# Patient Record
Sex: Female | Born: 1960 | Race: White | Hispanic: No | Marital: Married | Smoking: Never smoker
Health system: Southern US, Community
[De-identification: ages and names within clinical notes are randomized; demographics above are authoritative.]

## PROBLEM LIST (undated history)

## (undated) DIAGNOSIS — E119 Type 2 diabetes mellitus without complications: Secondary | ICD-10-CM

## (undated) DIAGNOSIS — M1990 Unspecified osteoarthritis, unspecified site: Secondary | ICD-10-CM

## (undated) DIAGNOSIS — R011 Cardiac murmur, unspecified: Secondary | ICD-10-CM

## (undated) DIAGNOSIS — IMO0001 Reserved for inherently not codable concepts without codable children: Secondary | ICD-10-CM

## (undated) DIAGNOSIS — I5032 Chronic diastolic (congestive) heart failure: Secondary | ICD-10-CM

## (undated) DIAGNOSIS — J189 Pneumonia, unspecified organism: Secondary | ICD-10-CM

## (undated) DIAGNOSIS — K219 Gastro-esophageal reflux disease without esophagitis: Secondary | ICD-10-CM

## (undated) DIAGNOSIS — R7611 Nonspecific reaction to tuberculin skin test without active tuberculosis: Secondary | ICD-10-CM

## (undated) DIAGNOSIS — J40 Bronchitis, not specified as acute or chronic: Secondary | ICD-10-CM

## (undated) DIAGNOSIS — I10 Essential (primary) hypertension: Secondary | ICD-10-CM

## (undated) DIAGNOSIS — Z8489 Family history of other specified conditions: Secondary | ICD-10-CM

## (undated) HISTORY — PX: CARPAL TUNNEL RELEASE: SHX101

## (undated) HISTORY — PX: ULNAR NERVE REPAIR: SHX2594

## (undated) HISTORY — PX: TRIGGER FINGER RELEASE: SHX641

## (undated) HISTORY — PX: COLONOSCOPY: SHX174

## (undated) HISTORY — DX: Chronic diastolic (congestive) heart failure: I50.32

## (undated) HISTORY — PX: EYE SURGERY: SHX253

## (undated) HISTORY — PX: CERVICAL DISCECTOMY: SHX98

---

## 1999-02-21 ENCOUNTER — Other Ambulatory Visit: Admission: RE | Admit: 1999-02-21 | Discharge: 1999-02-21 | Payer: Self-pay | Admitting: Obstetrics and Gynecology

## 2000-02-25 ENCOUNTER — Other Ambulatory Visit: Admission: RE | Admit: 2000-02-25 | Discharge: 2000-02-25 | Payer: Self-pay | Admitting: Obstetrics and Gynecology

## 2000-06-16 ENCOUNTER — Encounter: Payer: Self-pay | Admitting: Obstetrics and Gynecology

## 2000-06-16 ENCOUNTER — Encounter: Admission: RE | Admit: 2000-06-16 | Discharge: 2000-06-16 | Payer: Self-pay | Admitting: Obstetrics and Gynecology

## 2001-05-24 ENCOUNTER — Encounter: Admission: RE | Admit: 2001-05-24 | Discharge: 2001-05-24 | Payer: Self-pay | Admitting: Internal Medicine

## 2001-05-24 ENCOUNTER — Encounter: Payer: Self-pay | Admitting: Internal Medicine

## 2001-07-20 ENCOUNTER — Other Ambulatory Visit: Admission: RE | Admit: 2001-07-20 | Discharge: 2001-07-20 | Payer: Self-pay | Admitting: Obstetrics and Gynecology

## 2001-07-21 ENCOUNTER — Encounter: Admission: RE | Admit: 2001-07-21 | Discharge: 2001-07-21 | Payer: Self-pay | Admitting: Obstetrics and Gynecology

## 2001-07-21 ENCOUNTER — Encounter: Payer: Self-pay | Admitting: Obstetrics and Gynecology

## 2001-11-20 ENCOUNTER — Encounter: Payer: Self-pay | Admitting: Emergency Medicine

## 2001-11-20 ENCOUNTER — Emergency Department (HOSPITAL_COMMUNITY): Admission: EM | Admit: 2001-11-20 | Discharge: 2001-11-20 | Payer: Self-pay | Admitting: Emergency Medicine

## 2002-09-05 ENCOUNTER — Other Ambulatory Visit: Admission: RE | Admit: 2002-09-05 | Discharge: 2002-09-05 | Payer: Self-pay | Admitting: Obstetrics and Gynecology

## 2002-12-16 ENCOUNTER — Encounter: Admission: RE | Admit: 2002-12-16 | Discharge: 2002-12-16 | Payer: Self-pay | Admitting: Obstetrics and Gynecology

## 2002-12-16 ENCOUNTER — Encounter: Payer: Self-pay | Admitting: Obstetrics and Gynecology

## 2004-04-11 ENCOUNTER — Ambulatory Visit (HOSPITAL_BASED_OUTPATIENT_CLINIC_OR_DEPARTMENT_OTHER): Admission: RE | Admit: 2004-04-11 | Discharge: 2004-04-11 | Payer: Self-pay | Admitting: Orthopedic Surgery

## 2004-04-11 ENCOUNTER — Ambulatory Visit (HOSPITAL_COMMUNITY): Admission: RE | Admit: 2004-04-11 | Discharge: 2004-04-11 | Payer: Self-pay | Admitting: Orthopedic Surgery

## 2005-04-10 ENCOUNTER — Emergency Department (HOSPITAL_COMMUNITY): Admission: EM | Admit: 2005-04-10 | Discharge: 2005-04-10 | Payer: Self-pay | Admitting: Emergency Medicine

## 2006-02-11 ENCOUNTER — Encounter: Admission: RE | Admit: 2006-02-11 | Discharge: 2006-02-11 | Payer: Self-pay | Admitting: Obstetrics and Gynecology

## 2006-03-04 ENCOUNTER — Encounter: Admission: RE | Admit: 2006-03-04 | Discharge: 2006-03-04 | Payer: Self-pay | Admitting: Obstetrics and Gynecology

## 2006-08-21 ENCOUNTER — Encounter: Admission: RE | Admit: 2006-08-21 | Discharge: 2006-08-21 | Payer: Self-pay | Admitting: Obstetrics and Gynecology

## 2006-11-03 ENCOUNTER — Ambulatory Visit (HOSPITAL_BASED_OUTPATIENT_CLINIC_OR_DEPARTMENT_OTHER): Admission: RE | Admit: 2006-11-03 | Discharge: 2006-11-03 | Payer: Self-pay | Admitting: Orthopedic Surgery

## 2007-02-12 ENCOUNTER — Encounter: Admission: RE | Admit: 2007-02-12 | Discharge: 2007-02-12 | Payer: Self-pay | Admitting: Obstetrics and Gynecology

## 2007-09-11 ENCOUNTER — Emergency Department (HOSPITAL_COMMUNITY): Admission: EM | Admit: 2007-09-11 | Discharge: 2007-09-12 | Payer: Self-pay | Admitting: Emergency Medicine

## 2008-02-16 ENCOUNTER — Encounter: Admission: RE | Admit: 2008-02-16 | Discharge: 2008-02-16 | Payer: Self-pay | Admitting: Obstetrics and Gynecology

## 2009-02-16 ENCOUNTER — Encounter: Admission: RE | Admit: 2009-02-16 | Discharge: 2009-02-16 | Payer: Self-pay | Admitting: Obstetrics and Gynecology

## 2009-12-27 ENCOUNTER — Ambulatory Visit (HOSPITAL_BASED_OUTPATIENT_CLINIC_OR_DEPARTMENT_OTHER): Admission: RE | Admit: 2009-12-27 | Discharge: 2009-12-27 | Payer: Self-pay | Admitting: Orthopedic Surgery

## 2010-03-15 ENCOUNTER — Encounter: Admission: RE | Admit: 2010-03-15 | Discharge: 2010-03-15 | Payer: Self-pay | Admitting: Obstetrics and Gynecology

## 2010-06-04 ENCOUNTER — Ambulatory Visit (HOSPITAL_BASED_OUTPATIENT_CLINIC_OR_DEPARTMENT_OTHER): Admission: RE | Admit: 2010-06-04 | Discharge: 2010-06-04 | Payer: Self-pay | Admitting: Orthopedic Surgery

## 2010-06-07 ENCOUNTER — Encounter (INDEPENDENT_AMBULATORY_CARE_PROVIDER_SITE_OTHER): Payer: Self-pay | Admitting: *Deleted

## 2010-06-18 ENCOUNTER — Encounter (INDEPENDENT_AMBULATORY_CARE_PROVIDER_SITE_OTHER): Payer: Self-pay | Admitting: *Deleted

## 2010-06-19 ENCOUNTER — Encounter (INDEPENDENT_AMBULATORY_CARE_PROVIDER_SITE_OTHER): Payer: Self-pay | Admitting: *Deleted

## 2010-06-19 ENCOUNTER — Telehealth: Payer: Self-pay | Admitting: Gastroenterology

## 2010-06-19 ENCOUNTER — Ambulatory Visit: Payer: Self-pay | Admitting: Gastroenterology

## 2010-07-10 ENCOUNTER — Ambulatory Visit: Payer: Self-pay | Admitting: Gastroenterology

## 2010-07-12 ENCOUNTER — Encounter: Payer: Self-pay | Admitting: Gastroenterology

## 2011-01-05 ENCOUNTER — Encounter: Payer: Self-pay | Admitting: Obstetrics and Gynecology

## 2011-01-16 NOTE — Letter (Signed)
Summary: Diabetic Instructions  [facility]  [address] [place], [place] [address]   Phone: [phone]  Fax: [phone]    Wanda Hicks Feb 17, 1961 MRN: [id number]   _  _   ORAL DIABETIC MEDICATION INSTRUCTIONS  The day before your procedure:   Take your diabetic pill as you do normally  The day of your procedure:   Do not take your diabetic pill    We will check your blood sugar levels during the admission process and again in Recovery before discharging you home  ________________________________________________________________________  _  _   INSULIN (LONG ACTING) MEDICATION INSTRUCTIONS (Lantus, NPH, 70/30, Humulin, Novolin-N)   The day before your procedure:   Take  your regular evening dose    The day of your procedure:   Do not take your morning dose    _  _   INSULIN (SHORT ACTING) MEDICATION INSTRUCTIONS (Regular, Humulog, Novolog)   The day before your procedure:   Do not take your evening dose   The day of your procedure:   Do not take your morning dose   _  _   INSULIN PUMP MEDICATION INSTRUCTIONS  We will contact the physician managing your diabetic care for written dosage instructions for the day before your procedure and the day of your procedure.  Once we have received the instructions, we will contact you.

## 2011-01-16 NOTE — Letter (Signed)
Summary: Patient Notice- Colon Biospy Results  [facility]  [address] [place], [place] [address]   Phone: [phone]  Fax: [phone]        July 12, 2010 MRN: [id number]    [facility] [address] [place], [place]  [address]    Dear Ms. Sardina,  I am pleased to inform you that the biopsies taken during your recent colonoscopy did not show any evidence of cancer upon pathologic examination.  Additional information/recommendations:  __No further action is needed at this time.  Please follow-up with      your primary care physician for your other healthcare needs.  __Please call [phone] to schedule a return visit to review      your condition.  _x_Continue with the treatment plan as outlined on the day of your      exam.  __You should have a repeat colonoscopy examination for this problem           in 10_ years.  Please call us if you are having persistent problems or have questions about your condition that have not been fully answered at this time.  Sincerely,  Mardella Layman MD [facility]   This letter has been electronically signed by your physician.  Appended Document: Patient Notice- Colon Biospy Results letter mailed 8.1.2011

## 2011-01-16 NOTE — Miscellaneous (Signed)
Summary: [facility]  Clinical Lists Changes  Medications: Added new medication of MOVIPREP 100 GM  SOLR (PEG-KCL-NACL-NASULF-NA ASC-C) As per prep instructions. - Signed Rx of MOVIPREP 100 GM  SOLR (PEG-KCL-NACL-NASULF-NA ASC-C) As per prep instructions.;  #1 x 0;  Signed;  Entered by: Ezra Sites RN;  Authorized by: Mardella Layman MD [facility];  Method used: Electronically to [facility] [address], [place], [place], [place]  [address], Ph: [phone], Fax: [phone] Observations: Added new observation of NKA: T (06/19/2010 10:08)    Prescriptions: MOVIPREP 100 GM  SOLR (PEG-KCL-NACL-NASULF-NA ASC-C) As per prep instructions.  #1 x 0   Entered by:   Ezra Sites RN   Authorized by:   Mardella Layman MD [facility]   Signed by:   Ezra Sites RN on 06/19/2010   Method used:   Electronically to        [facility] (retail[facility]       [address]       [place], [place]  [address]       Ph: [phone]       Fax: [phone]   RxID:   [id number]

## 2011-01-16 NOTE — Letter (Signed)
Summary: [facility] Instructions  [facility]  [address] [place], [place] [address]   Phone: [phone]  Fax: [phone]       Wanda Hicks    [birth year]    MRN: [id number]        Procedure Day Dorna Bloom:  [name]  07/10/10     Arrival Time: 9:00am     Procedure Time: 10:00am     Location of Procedure:                    Wanda Hicks  [facility] (4th Floor)                        PREPARATION FOR COLONOSCOPY WITH MOVIPREP   Starting 5 days prior to your procedure  FRIDAY 07/22  do not eat nuts, seeds, popcorn, corn, beans, peas,  salads, or any raw vegetables.  Do not take any fiber supplements (e.g. Metamucil, Citrucel, and Benefiber).  THE DAY BEFORE YOUR PROCEDURE         DATE: TUESDAY  07/26  1.  Drink clear liquids the entire day-NO SOLID FOOD  2.  Do not drink anything colored red or purple.  Avoid juices with pulp.  No orange juice.  3.  Drink at least 64 oz. (8 glasses) of fluid/clear liquids during the day to prevent dehydration and help the prep work efficiently.  CLEAR LIQUIDS INCLUDE: Water Jello Ice Popsicles Tea (sugar ok, no milk/cream) Powdered fruit flavored drinks Coffee (sugar ok, no milk/cream) Gatorade Juice: apple, white grape, white cranberry  Lemonade Clear bullion, consomm, broth Carbonated beverages (any kind) Strained chicken noodle soup Hard Candy                             4.  In the morning, mix first dose of MoviPrep solution:    Empty 1 Pouch A and 1 Pouch B into the disposable container    Add lukewarm drinking water to the top line of the container. Mix to dissolve    Refrigerate (mixed solution should be used within 24 hrs)  5.  Begin drinking the prep at 5:00 p.m. The MoviPrep container is divided by 4 marks.   Every 15 minutes drink the solution down to the next mark (approximately 8 oz) until the full liter is complete.   6.  Follow completed prep with 16 oz of clear liquid of your choice  (Nothing red or purple).  Continue to drink clear liquids until bedtime.  7.  Before going to bed, mix second dose of MoviPrep solution:    Empty 1 Pouch A and 1 Pouch B into the disposable container    Add lukewarm drinking water to the top line of the container. Mix to dissolve    Refrigerate  THE DAY OF YOUR PROCEDURE      DATE: [facility]  07/27  Beginning at  5:00 a.m. (5 hours before procedure):         1. Every 15 minutes, drink the solution down to the next mark (approx 8 oz) until the full liter is complete.  2. Follow completed prep with 16 oz. of clear liquid of your choice.    3. You may drink clear liquids until 8:00am  (2 HOURS BEFORE PROCEDURE).   MEDICATION INSTRUCTIONS  Unless otherwise instructed, you should take regular prescription medications with a small sip of water   as early as possible  the morning of your procedure.  Diabetic patients - see separate instructions.  Additional medication instructions:  Hold Lisinopril/HCTZ day of procedure.         OTHER INSTRUCTIONS  You will need a responsible adult at least 52 years of age to accompany you and drive you home.   This person must remain in the waiting room during your procedure.  Wear loose fitting clothing that is easily removed.  Leave jewelry and other valuables at home.  However, you may wish to bring a book to read or  an iPod/MP3 player to listen to music as you wait for your procedure to start.  Remove all body piercing jewelry and leave at home.  Total time from sign-in until discharge is approximately 2-3 hours.  You should go home directly after your procedure and rest.  You can resume normal activities the  day after your procedure.  The day of your procedure you should not:   Drive   Make legal decisions   Operate machinery   Drink alcohol   Return to work  You will receive specific instructions about eating, activities and medications before you leave.    The  above instructions have been reviewed and explained to me by   Wanda Sites RN  June 19, 2010 10:30 AM    I fully understand and can verbalize these instructions _____________________________ Date _________

## 2011-01-16 NOTE — Letter (Signed)
Summary: Previsit letter  [facility]  [address] [place], [place] [address]   Phone: [phone]  Fax: [phone]       06/07/2010 MRN: [id number]  [facility] [address] [place], [place]  [address]  Dear Ms. Wanda Hicks,  Welcome to the Gastroenterology Division at [facility].    You are scheduled to see a nurse for your pre-procedure visit on 06/26/2010 at 1:00pm on the 3rd floor at [facility] [address]  We ask that you try to arrive at our office 15 minutes prior to your appointment time to allow for check-in.  Your nurse visit will consist of discussing your medical and surgical history, your immediate family medical history, and your medications.    Please bring a complete list of all your medications or, if you prefer, bring the medication bottles and we will list them.  We will need to be aware of both prescribed and over the counter drugs.  We will need to know exact dosage information as well.  If you are on blood thinners (Coumadin, Plavix, Aggrenox, Ticlid, etc.) please call our office today/prior to your appointment, as we need to consult with your physician about holding your medication.   Please be prepared to read and sign documents such as consent forms, a financial agreement, and acknowledgement forms.  If necessary, and with your consent, a friend or relative is welcome to sit-in on the nurse visit with you.  Please bring your insurance card so that we may make a copy of it.  If your insurance requires a referral to see a specialist, please bring your referral form from your primary care physician.  No co-pay is required for this nurse visit.     If you cannot keep your appointment, please call [phone] to cancel or reschedule prior to your appointment date.  This allows [place] the opportunity to schedule an appointment for another patient in need of care.    Thank you for choosing [facility] for your medical needs.   We appreciate the opportunity to care for you.  Please visit [place] at our website  to learn more about our practice.                     Sincerely.                                                                                                                   The Gastroenterology Division

## 2011-01-16 NOTE — Procedures (Signed)
Summary: Colonoscopy  Patient: Wanda Hicks Note: All result statuses are Final unless otherwise noted.  Tests: (1) Colonoscopy (COL)   COL Colonoscopy           DONE     [facility]     [address]     [place], [place]  [address]           COLONOSCOPY PROCEDURE REPORT           PATIENT:  Wanda Hicks, Wanda Hicks  MR#:  [id number]     BIRTHDATE:  06/18/1961, 49 yrs. old  GENDER:  female     ENDOSCOPIST:  Vania Rea. Jarold Motto, MD, [facility]     REF. BY:  Geoffry Paradise, M.D.     PROCEDURE DATE:  07/10/2010     PROCEDURE:  Colonoscopy with biopsy     ASA CLASS:  Class II     INDICATIONS:  Routine Risk Screening     MEDICATIONS:   Fentanyl 75 mcg IV, Versed 9 mg IV           DESCRIPTION OF PROCEDURE:   After the risks benefits and     alternatives of the procedure were thoroughly explained, informed     consent was obtained.  Digital rectal exam was performed and     revealed no abnormalities.   The LB CF-H180AL [id number] endoscope     was introduced through the anus and advanced to the terminal ileum     which was intubated for a short distance, without limitations.     The quality of the prep was excellent, using MoviPrep.  The     instrument was then slowly withdrawn as the colon was fully     examined.     <<PROCEDUREIMAGES>>     FINDINGS:  Abnormal appearing mucosa. Inflammed IC valve     biopsied.the ileum appears normal.  No polyps or cancers were     seen.  This was otherwise a normal examination of the colon.     Retroflexed views in the rectum revealed no abnormalities.    The     scope     was then withdrawn from the patient and the procedure completed.           COMPLICATIONS:  None     ENDOSCOPIC IMPRESSION:     1) Abnormal mucosa     2) No polyps or cancers     3) Otherwise normal examination     probable gut NSAID damage.No evidence or hx. of IBD.     RECOMMENDATIONS:     1) Await biopsy results     2) Continue current colorectal screening recommendations for  "routine risk" patients with a repeat colonoscopy in 10 years.     REPEAT EXAM:  No           ______________________________     Vania Rea. Jarold Motto, MD, [facility]           CC:           n.     eSIGNED:   Vania Rea. Natane Heward at 07/10/2010 10:21 AM           Gretel Acre, [id number]  Note: An exclamation mark (!) indicates a result that was not dispersed into the flowsheet. Document Creation Date: 07/10/2010 10:22 AM _______________________________________________________________________  (1) Order result status: Final Collection or observation date-time: 07/10/2010 10:14 Requested date-time:  Receipt date-time:  Reported date-time:  Referring Physician:   Ordering Physician: Sheryn Bison [id number]) Specimen Source:  Source: Launa Grill Order Number: [id number] Lab site:   Appended Document: Colonoscopy     Procedures Next Due Date:    Colonoscopy: 06/2020

## 2011-01-16 NOTE — Letter (Signed)
Summary: Insulin pump letter-Colonoscopy   [facility]  [address] [place], [place] [address]   Phone: [phone]  Fax: [phone]      Date: June 19, 2010  Re: LEMOYNE SCARPATI DOB: 05/31/1961 MRN: [id number]     Dear Dr. :  Jacky Kindle     Dr. Jarold Motto  has scheduled the above patient for a colonoscopy at 10:00  on July 27.  Our records show that he/she is on insulin therapy via an insulin pump.  Our colonoscopy prep protocol requires that:   the patient must be on a clear liquid diet the entire day prior to the procedure date as well as the morning of the procedure   the patient must be NPO for 2 hours prior to the procedure    the patient must consume a PEG 3350 solution to prepare for the procedure.  Please advise [place] of any adjustments that need to be made to the patient's insulin pump therapy prior to the above procedure date.    Please route or fax back this completed form to me at [phone]  If you have any question, please call me at [phone].  Thank you for your help with this matter.  Sincerely,   Ashok Cordia RN     Physician Recommendation:  ________________________________________________  ________________________________________________________________________  ________________________________________________________________________  ________________________________________________________________________  Appended Document: Insulin pump letter-Colonoscopy Per Selena Batten at Dr. Lanell Matar office they will contact pt with instructions re insulin pump.  Pt is to decrease midnight basals down by 3 units until after colon is done and eating regular diet.  No Bolus and check blood sugars frequently.

## 2011-01-16 NOTE — Progress Notes (Signed)
Summary: Insulin Pump and Colon  ---- Converted from flag ---- ---- 06/19/2010 11:56 AM, Ezra Sites RN wrote: Wanda Hicks, Wanda Hicks is scheduled for colonoscopy 7/27 at 10:0 with Dr. Jarold Motto.  She is diabetic with an insulin pump.  Dr. Geoffry Paradise manages her diabetes.  Could you send a letter to him requesting insulin pump management the day before the procedure and the day of.  I told her that you would call her after you hear from Dr. Jacky Kindle.  Pt's home phone number is [phone].  Cell is [phone]   Thanks ------------------------------  Phone Note Outgoing Call    Follow-up for Phone Call        Letter faxed to Dr. Jacky Kindle.  Will await reply. Follow-up by: Ashok Cordia RN,  June 19, 2010 1:04 PM

## 2011-01-16 NOTE — Progress Notes (Signed)
Summary: Cost of Prep  Phone Note Call from Patient Call back at [phone]   Caller: Patient Call For: Jarold Motto Summary of Call: Pts copay for moviprep is $55 her insurance does cover it that is just her copay. She would like something less expensive and new instructions. Initial call taken by: Harlow Mares CMA Duncan Dull),  June 19, 2010 12:16 PM  Follow-up for Phone Call        NO ALTERNATIVE WITH DIABETES...NO OTHER APPROVED PREP ECEPT NU-TELY... Follow-up by: Mardella Layman MD Eustace Quail 3:43 PM  Additional Follow-up for Phone Call Additional follow up Details #1::        Talked with pt.  Rebate coupon given to pt.   Additional Follow-up by: Ashok Cordia RN,  June 20, 2010 12:11 PM

## 2011-01-16 NOTE — Letter (Signed)
Summary: Diabetic Instructions  [facility]  [address] [place], [place] [address]   Phone: [phone]  Fax: [phone]    Wanda Hicks [birth year] MRN: [id number]          INSULIN PUMP MEDICATION INSTRUCTIONS  We will contact the physician managing your diabetic care for written dosage instructions for the day before your procedure and the day of your procedure.  Once we have received the instructions, we will contact you.

## 2011-03-01 LAB — GLUCOSE, CAPILLARY: Glucose-Capillary: 53 mg/dL — ABNORMAL LOW (ref 70–99)

## 2011-04-21 ENCOUNTER — Other Ambulatory Visit: Payer: Self-pay | Admitting: Obstetrics and Gynecology

## 2011-04-21 DIAGNOSIS — Z1231 Encounter for screening mammogram for malignant neoplasm of breast: Secondary | ICD-10-CM

## 2011-04-29 ENCOUNTER — Ambulatory Visit
Admission: RE | Admit: 2011-04-29 | Discharge: 2011-04-29 | Disposition: A | Discharge status: Home / Self Care | Payer: BC Managed Care – PPO | Source: Ambulatory Visit | Attending: Obstetrics and Gynecology | Admitting: Obstetrics and Gynecology

## 2011-04-29 DIAGNOSIS — Z1231 Encounter for screening mammogram for malignant neoplasm of breast: Secondary | ICD-10-CM

## 2011-05-02 NOTE — Op Note (Signed)
NAME:  CHARMION, HAPKE                ACCOUNT NO.:  [id number]   MEDICAL RECORD NO.:  [id number]          PATIENT TYPE:  AMB   LOCATION:  DSC                          FACILITY:  [facility]   PHYSICIAN:  Katy Fitch. Sypher, M.D. 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   OPERATION:  Release of right ring finger A1 pulley.   OPERATION SURGEON:  Josephine Igo, M.D.   ASSISTANT:  Annye Rusk, P.A.-C.   ANESTHESIA:  2% lidocaine supplemented by intravenous sedation.   SUPERVISING ANESTHESIOLOGIST:  Dr. Sampson Goon.   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 Othelia Pulling for evaluation and management of chronic  stenosing tenosynovitis.  She has had prior A1 pulley releases.   She has had triggering of her right ring finger and requests release of the  A1 pulley at this time.   PROCEDURE:  Glenys Snader is brought to the operating room and placed in the  supine position on the operating table.   Following light sedation, the right arm was prepped with Betadine soap  solution and sterilely draped.  A pneumatic tourniquet was applied at the  proximal right brachium.   Following exsanguination of right arm with an Esmarch bandage, the arterial  tourniquet was inflated to 240 mmHg.   The procedure commenced with a short incision in the line of the distal  palmar crease.  The subcutaneous tissues were carefully divided around the  palmar fascia.  This was excised.  The A1 pulley was isolated.  Blunt  retractors were placed to protect the common digital vessels and nerves.   The pulley was then split with scalpel scissors along its radial border.  Thereafter, full active  range of motion of the finger was demonstrated by  Ms. Brooke Dare.   The wound was then repaired with a mattress suture of 5-0 nylon.  A  compressive dressing was applied with Xeroform sterile gauze and an Ace  wrap.   Ms. Soulliere is advised to begin immediate range of motion exercises.  She is  provided Darvocet-N 100, one by mouth ever 4-6 hours p.r.n. pain, 20 tablets  without refill.   She will return to the office for follow-up in one week or sooner if  problems.      Katy Fitch Sypher, M.D.  Electronically Signed     RVS/MEDQ  D:  11/03/2006  T:  11/03/2006  Job:  [id number]   cc:   Geoffry Paradise, M.D.

## 2011-05-02 NOTE — Op Note (Signed)
NAME:  Wanda Hicks, Wanda Hicks                          ACCOUNT NO.:  [id number]   MEDICAL RECORD NO.:  [id number]                   PATIENT TYPE:  AMB   LOCATION:  DSC                                  FACILITY:  [facility]   PHYSICIAN:  Katy Fitch. Naaman Plummer., M.D.          DATE OF BIRTH:  09-Aug-1961   DATE OF PROCEDURE:  04/11/2004  DATE OF DISCHARGE:                                 OPERATIVE REPORT   PREOPERATIVE DIAGNOSIS:  Chronic stenosing tenosynovitis, left long finger  at A-1 pulley.   POSTOPERATIVE DIAGNOSIS:  Chronic stenosing tenosynovitis, left long finger  at A-1 pulley.   OPERATION PERFORMED:  Release of left long finger A-1 pulley.   SURGEON:  Katy Fitch. Sypher, M.D.   ASSISTANT:  Jonni Sanger, P.A.   ANESTHESIA:  0.25% Marcaine and 2% lidocaine metacarpal head level block,  left long finger supplemented by intravenous sedation.   SUPERVISING ANESTHESIOLOGIST:  Janetta Hora. Gelene Mink, M.D.   INDICATIONS FOR PROCEDURE:  [name] is a 50 year old woman with a  history of insulin dependent diabetes.  She has had a history of carpal  tunnel syndrome and stenosing tenosynovitis.  She has had a chronic left  long finger stenosing tenosynovitis unresponsive to nonoperative management.  Due to failure to respond, the patient is brought to the operating room at  this time for release of her left long finger A-1 pulley.   DESCRIPTION OF PROCEDURE:  [name] was brought to the operating room  and placed in supine position on the operating table.  Following routine  Betadine scrub and paint, 0.25% Marcaine and 2% lidocaine were infiltrated  into the flexor sheath of the left long finger.  When anesthesia was  satisfactory, the left arm was exsanguinated with an Esmarch bandage and  arterial tourniquet inflated to 220 mmHg.  The procedure commenced with a  short incision in the distal palmar crease overlying the A-1 pulley of the  left long finger.   The subcutaneous tissues  were carefully divided revealing the palmar fascia.  This was split with scissors to reveal the A-1 pulley.  The neurovascular  bundles were gently retracted followed by release of an A0 and A1 pulley.  Thereafter full range of motion of the finger was recovered.  The wound was  then irrigated and repaired with  interrupted sutures of 5-0 nylon.  A compressive dressing was applied.  There were no apparent complications.  [name] was awakened from her  sedation and transferred to the recovery room with stable vital signs.   For aftercare, she was given a prescription for Vicodin 5 mg 1 by mouth  every four to six hours as needed for pain.  Katy Fitch Naaman Plummer., M.D.    RVS/MEDQ  D:  04/11/2004  T:  04/11/2004  Job:  [id number]   cc:   Geoffry Paradise, M.D.  [address]  [place]  [place] [address]  Fax: [phone]

## 2011-06-09 ENCOUNTER — Encounter (HOSPITAL_BASED_OUTPATIENT_CLINIC_OR_DEPARTMENT_OTHER)
Admission: RE | Admit: 2011-06-09 | Discharge: 2011-06-09 | Disposition: A | Discharge status: Home / Self Care | Payer: BC Managed Care – PPO | Source: Ambulatory Visit | Attending: Orthopedic Surgery | Admitting: Orthopedic Surgery

## 2011-06-09 LAB — BASIC METABOLIC PANEL
BUN: 20 mg/dL (ref 6–23)
CO2: 29 mEq/L (ref 19–32)
Calcium: 9.8 mg/dL (ref 8.4–10.5)
Creatinine, Ser: 0.7 mg/dL (ref 0.50–1.10)
GFR calc Af Amer: >60 mL/min (ref >60)
GFR calc non Af Amer: >60 mL/min (ref >60)
Glucose, Bld: 118 mg/dL — ABNORMAL HIGH (ref 70–99)
Potassium: 3.9 mEq/L (ref 3.5–5.1)
Sodium: 138 mEq/L (ref 135–145)

## 2011-06-10 ENCOUNTER — Ambulatory Visit (HOSPITAL_BASED_OUTPATIENT_CLINIC_OR_DEPARTMENT_OTHER)
Admission: RE | Admit: 2011-06-10 | Discharge: 2011-06-10 | Disposition: A | Discharge status: Home / Self Care | Payer: BC Managed Care – PPO | Source: Ambulatory Visit | Attending: Orthopedic Surgery | Admitting: Orthopedic Surgery

## 2011-06-10 DIAGNOSIS — Z01810 Encounter for preprocedural cardiovascular examination: Secondary | ICD-10-CM | POA: Insufficient documentation

## 2011-06-10 DIAGNOSIS — Z9641 Presence of insulin pump (external) (internal): Secondary | ICD-10-CM | POA: Insufficient documentation

## 2011-06-10 DIAGNOSIS — K219 Gastro-esophageal reflux disease without esophagitis: Secondary | ICD-10-CM | POA: Insufficient documentation

## 2011-06-10 DIAGNOSIS — G5620 Lesion of ulnar nerve, unspecified upper limb: Secondary | ICD-10-CM | POA: Insufficient documentation

## 2011-06-10 DIAGNOSIS — Z794 Long term (current) use of insulin: Secondary | ICD-10-CM | POA: Insufficient documentation

## 2011-06-10 DIAGNOSIS — M65839 Other synovitis and tenosynovitis, unspecified forearm: Secondary | ICD-10-CM | POA: Insufficient documentation

## 2011-06-10 DIAGNOSIS — I10 Essential (primary) hypertension: Secondary | ICD-10-CM | POA: Insufficient documentation

## 2011-06-10 DIAGNOSIS — Z01812 Encounter for preprocedural laboratory examination: Secondary | ICD-10-CM | POA: Insufficient documentation

## 2011-06-10 DIAGNOSIS — E119 Type 2 diabetes mellitus without complications: Secondary | ICD-10-CM | POA: Insufficient documentation

## 2011-06-10 DIAGNOSIS — E669 Obesity, unspecified: Secondary | ICD-10-CM | POA: Insufficient documentation

## 2011-06-10 LAB — GLUCOSE, CAPILLARY: Glucose-Capillary: 156 mg/dL — ABNORMAL HIGH (ref 70–99)

## 2011-06-12 NOTE — Op Note (Signed)
NAME:  Wanda Hicks, Wanda Hicks                ACCOUNT NO.:  [id number]  MEDICAL RECORD NO.:  [id number]  LOCATION:                                 FACILITY:  PHYSICIAN:  Katy Fitch. Zahli Vetsch, M.D. DATE OF BIRTH:  1961/02/28  DATE OF PROCEDURE:  06/10/2011 DATE OF DISCHARGE:                              OPERATIVE REPORT   PREOPERATIVE DIAGNOSES: 1. 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 2. Chronic stenosing tenosynovitis, right index finger at A1 pulley. 3. 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  POSTOPERATIVE DIAGNOSES: 1. 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 2. Chronic stenosing tenosynovitis, right index finger at A1 pulley. 3. 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  OPERATIONS: 1. In situ decompression of right ulnar nerve at cubital tunnel. 2. Release of right index finger A1 pulley with incidental     tenosynovectomy. 3. Release of left index finger A1 pulley with incidental     tenosynovectomy.  OPERATIONS:  Katy Fitch. Jacquita Mulhearn, MD  ASSISTANT:  Marveen Reeks. Dasnoit, PA-C.  ANESTHESIA:  General by LMA supplemented by a right plexus block for postoperative comfort.  Supervising anesthesiologist is Dr. 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  She has used an insulin pump for 18 years.  She has had multiple episodes of stenosing tenosynovitis treated with surgical release and is status post left ulnar nerve decompression.  Recently she noted numbness in her ulnar distribution of the right upper extremity.  We performed electrodiagnostic studies which revealed significant slowing of the right ulnar nerve across the cubital tunnel at 48 meters per second.  We recommended that she consider in situ decompression and possible  transposition if we found evidence of a grossly unstable nerve.  Questions regarding anticipated procedures invited and answered.  Preoperatively, she was interviewed by Dr. Gelene Mink of Anesthesia. General anesthesia by LMA technique was recommended and accepted.  Dr. Gelene Mink also recommended a perioperative regional block for comfort. This was placed without complication with ultrasound guidance in the holding area preoperatively.  PROCEDURE IN DETAILS:  Wanda Hicks was brought to room 1 of the [facility] and placed in supine position on the operating room table.  Following induction of general anesthesia by LMA technique under Dr. Thornton Dales direct supervision, the right and left arms were prepped with Betadine soap solution, sterilely draped.  A pneumatic tourniquet was applied to the proximal right brachium.  Our plan is to use an Esmarch below the antecubital IV on the left.  After routine surgical time-out and administration of 1 g of Ancef as an IV prophylactic antibiotic, the left hand and forearm was exsanguinated with an Esmarch bandage which was left in the proximal forearm as a tourniquet.  Procedure commenced with a short oblique incision directly over the palpably thickened A1 pulley.  Subcutaneous tissues were carefully divided taking care to release the palmar fascia, pretendinous fibers. The A1 pulley was very thick and had myxoid cyst formation.  The A1 pulley was isolated,  split with scalpel and scissors along its radial border.  The flexor tendon was delivered and a thick cuff of fibrotic tenosynovium removed from the superficialis tendon proximal to the A1 pulley.  This was removed with scissors and micro rongeur dissection.  The index finger then was noted to have full passive glide without residual triggering.  The wound was repaired with intradermal 3-0 Prolene and Steri-Strips, 2% lidocaine was infiltrated for  postoperative analgesia.  Tourniquet was released on the left with immediate cap refill to all fingers.  Attention then directed to the right arm, was exsanguinated with an Esmarch bandage and arterial tourniquet on the proximal brachium inflated to 220 mmHg.  Procedure commenced with a short oblique incision directly over the right index finger A1 pulley.  Once again the pretendinous fibers of the palmar fascia released followed by isolation of the A1 pulley.  Pulley was quite thickened with a cuff of fibrotic tenosynovium proximal to the pulley.  The pulley was split along its radial border, the tendons delivered and the fibrotic tenosynovium resected with scissors and micro rongeur.  Once again passive range of motion of the finger was assured.  The wound was repaired with intradermal 3-0 Prolene and Steri-Strips.  Attention then directed to the right elbow.  The medial epicondyle was identified by palpation.  A 3-cm incision paralleling the path of the ulnar nerve was fashioned. Subcutaneous tissues were carefully divided, taken care to identify and protect the posterior branch of the medial antebrachial cutaneous nerve. Visualization was somewhat challenging due to subcutaneous adipose tissue.  The nerve was identified by palpation and noted to be subluxed anteriorly with the elbow in full flexion.  We decompressed the nerve 5 cm above the elbow by release of the brachial fascia.  We decompressed the nerve at the elbow and identified marked compression at Osborne's band due to the tendency for subluxation.  The cubital groove was quite shallow.  The fascia of the head of flexor carpi ulnaris was isolated while protecting the nerve with a Therapist, nutritional split with scissors.  The fibers of the flexor carpi ulnaris were split and the nerve decompressed 6 cm distal to the epicondyle.  With elbow flexion and extension, the nerve did glide anterior to the epicondyle.  However, due to  the shallow nature of the epicondyle and abundant adipose tissue, in my judgment there were no sites of residual compression.  I do not believe that this will be a source of chronic irritation as the nerve has been apparently subluxed for a considerable period of time.  It appeared that the compression was due to Osborne's band causing direct compression of the nerve with the elbow at 90 degrees of flexion.  The nerve will be allowed to remain in a subluxed position.  Bleeding points were cauterized with bipolar current followed by repair of the skin with subcutaneous suture of 4-0 Vicryl and intradermal 3-0 Prolene with Steri-Strips.  Lidocaine 2% was infiltrated for postop analgesia.  For aftercare, Ms. Laflamme is provided prescription for Percocet 5 mg one p.o. q.4-6 h. p.r.n. pain 24 tablets without refill.     Katy Fitch Jeris Easterly, M.D.     RVS/MEDQ  D:  06/10/2011  T:  06/10/2011  Job:  [id number]  cc:   Eber Hong  Electronically Signed by Josephine Igo M.D. on 06/12/2011 04:54:09 PM

## 2011-09-10 ENCOUNTER — Emergency Department (HOSPITAL_COMMUNITY)
Admission: EM | Admit: 2011-09-10 | Discharge: 2011-09-11 | Discharge status: Against Medical Advice | Payer: BC Managed Care – PPO | Attending: Emergency Medicine | Admitting: Emergency Medicine

## 2011-09-10 DIAGNOSIS — W278XXA Contact with other nonpowered hand tool, initial encounter: Secondary | ICD-10-CM | POA: Insufficient documentation

## 2011-09-10 DIAGNOSIS — S71109A Unspecified open wound, unspecified thigh, initial encounter: Secondary | ICD-10-CM | POA: Insufficient documentation

## 2011-09-10 DIAGNOSIS — S71009A Unspecified open wound, unspecified hip, initial encounter: Secondary | ICD-10-CM | POA: Insufficient documentation

## 2011-09-25 LAB — POCT I-STAT CREATININE
Creatinine, Ser: 0.8
Operator id: 151321

## 2011-09-25 LAB — I-STAT 8, (EC8 V) (CONVERTED LAB)
BUN: 15
Bicarbonate: 26.6 — ABNORMAL HIGH
Chloride: 106
Glucose, Bld: 188 — ABNORMAL HIGH
Potassium: 4.1
pH, Ven: 7.461 — ABNORMAL HIGH

## 2011-09-25 LAB — DIFFERENTIAL
Basophils Relative: 1
Eosinophils Absolute: 0.2
Eosinophils Relative: 2
Lymphocytes Relative: 29
Monocytes Relative: 5
Neutrophils Relative %: 64

## 2011-09-25 LAB — APTT: aPTT: 30

## 2011-09-25 LAB — POCT CARDIAC MARKERS: CKMB, poc: <1 — ABNORMAL LOW

## 2012-06-14 ENCOUNTER — Other Ambulatory Visit: Payer: Self-pay | Admitting: Obstetrics and Gynecology

## 2012-07-21 ENCOUNTER — Other Ambulatory Visit: Payer: Self-pay | Admitting: Obstetrics

## 2012-07-21 DIAGNOSIS — Z1231 Encounter for screening mammogram for malignant neoplasm of breast: Secondary | ICD-10-CM

## 2012-07-26 ENCOUNTER — Ambulatory Visit: Payer: BC Managed Care – PPO

## 2013-02-17 ENCOUNTER — Other Ambulatory Visit: Payer: Self-pay | Admitting: Family Medicine

## 2013-02-17 ENCOUNTER — Ambulatory Visit
Admission: RE | Admit: 2013-02-17 | Discharge: 2013-02-17 | Disposition: A | Discharge status: Home / Self Care | Payer: Worker's Compensation | Source: Ambulatory Visit | Attending: Family Medicine | Admitting: Family Medicine

## 2013-02-17 DIAGNOSIS — T07XXXA Unspecified multiple injuries, initial encounter: Secondary | ICD-10-CM

## 2013-07-15 ENCOUNTER — Ambulatory Visit: Payer: Self-pay

## 2013-08-16 ENCOUNTER — Ambulatory Visit
Admission: RE | Admit: 2013-08-16 | Discharge: 2013-08-16 | Disposition: A | Discharge status: Home / Self Care | Payer: BC Managed Care – PPO | Source: Ambulatory Visit | Attending: Obstetrics | Admitting: Obstetrics

## 2013-08-16 DIAGNOSIS — Z1231 Encounter for screening mammogram for malignant neoplasm of breast: Secondary | ICD-10-CM

## 2014-07-17 ENCOUNTER — Other Ambulatory Visit: Payer: Self-pay

## 2014-07-17 DIAGNOSIS — Z1231 Encounter for screening mammogram for malignant neoplasm of breast: Secondary | ICD-10-CM

## 2014-08-18 ENCOUNTER — Ambulatory Visit
Admission: RE | Admit: 2014-08-18 | Discharge: 2014-08-18 | Disposition: A | Discharge status: Home / Self Care | Payer: BC Managed Care – PPO | Source: Ambulatory Visit

## 2014-08-18 DIAGNOSIS — Z1231 Encounter for screening mammogram for malignant neoplasm of breast: Secondary | ICD-10-CM

## 2014-09-14 ENCOUNTER — Ambulatory Visit (INDEPENDENT_AMBULATORY_CARE_PROVIDER_SITE_OTHER): Payer: BC Managed Care – PPO | Admitting: Cardiovascular Disease

## 2014-09-14 ENCOUNTER — Encounter: Payer: Self-pay | Admitting: Cardiovascular Disease

## 2014-09-14 VITALS — BP 132/82 | HR 77 | Resp 16 | Ht 64.0 in | Wt 207.8 lb

## 2014-09-14 DIAGNOSIS — R0602 Shortness of breath: Secondary | ICD-10-CM

## 2014-09-14 DIAGNOSIS — R42 Dizziness and giddiness: Secondary | ICD-10-CM

## 2014-09-14 DIAGNOSIS — R011 Cardiac murmur, unspecified: Secondary | ICD-10-CM

## 2014-09-14 DIAGNOSIS — R002 Palpitations: Secondary | ICD-10-CM

## 2014-09-14 DIAGNOSIS — R9431 Abnormal electrocardiogram [ECG] [EKG]: Secondary | ICD-10-CM

## 2014-09-14 DIAGNOSIS — E1052 Type 1 diabetes mellitus with diabetic peripheral angiopathy with gangrene: Secondary | ICD-10-CM

## 2014-09-14 DIAGNOSIS — R0789 Other chest pain: Secondary | ICD-10-CM

## 2014-09-14 DIAGNOSIS — I10 Essential (primary) hypertension: Secondary | ICD-10-CM

## 2014-09-14 DIAGNOSIS — E1059 Type 1 diabetes mellitus with other circulatory complications: Secondary | ICD-10-CM

## 2014-09-14 DIAGNOSIS — E7800 Pure hypercholesterolemia, unspecified: Secondary | ICD-10-CM | POA: Insufficient documentation

## 2014-09-14 DIAGNOSIS — M459 Ankylosing spondylitis of unspecified sites in spine: Secondary | ICD-10-CM | POA: Insufficient documentation

## 2014-09-14 NOTE — Progress Notes (Signed)
Patient ID: Wanda Hicks, female   DOB: December 09, 1961, 53 y.o.   MRN: [id number]     Reason for office visit Dizziness, abnormal ECG, shortness of breath  Wanda Hicks is a 53 year old woman with type 1 diabetes mellitus since age 53. She uses an insulin pump. Diabetes control has been generally good her entire life. She also has mild treated systemic hypertension and recently had her dose of statin increased for persistently elevated LDL cholesterol. She has a long-standing history of HLA-B27 positive ankylosing spondylitis and was recently found to have a heart murmur. She has been having occasional palpitations, increasing shortness of breath and recently has had frequent episodes of "vertigo".  An electrocardiogram performed in Dr. Jacquiline Doe office showed ST segment depression and T-wave inversion in the lateral leads. The electrocardiogram looks identical today. The only older ECG available for comparison is from 2012 and was a normal tracing.  Her episodes of "vertigo" sound more like episodes of low blood pressure. They occur only when she is upright and are not associated with changes in head position, a sensation of spinning or any nausea/vomiting. She has not checked her blood pressure when these episodes occur but they are most common in the second part of the day. Have been going on now for several months. The most recent change in her medications was addition of Invokana roughly a year ago.  She has noticed some slight reduction in exercise tolerance due to shortness of breath but she continues to walk on a regular basis. The dyspnea occurs especially when she has to climb stairs. She is a Government social research officer. She has occasional atypical chest pressure that is not necessarily exertion related. She has occasional palpitations that are not associated with the spells of dizziness.  No Known Allergies  Current Outpatient Prescriptions  Medication Sig Dispense Refill  . ACCU-CHEK FASTCLIX LANCETS MISC        . ADVAIR DISKUS 100-50 MCG/DOSE AEPB Inhale 1 puff into the lungs daily.      Marland Kitchen aspirin 81 MG tablet Take 81 mg by mouth daily.      Marland Kitchen atorvastatin (LIPITOR) 40 MG tablet Take 1 tablet by mouth daily.      . Calcium Carb-Cholecalciferol (CALCIUM 600/VITAMIN D3) 600-800 MG-UNIT TABS Take 1 tablet by mouth daily.      . Diclofenac Sodium CR 100 MG 24 hr tablet Take 1 tablet by mouth daily.      . INVOKANA 100 MG TABS Take 1 tablet by mouth daily.      Marland Kitchen levothyroxine (SYNTHROID, LEVOTHROID) 112 MCG tablet Take 1 tablet by mouth daily.      Marland Kitchen lisinopril-hydrochlorothiazide (PRINZIDE,ZESTORETIC) 10-12.5 MG per tablet Take 1 tablet by mouth daily.      . montelukast (SINGULAIR) 10 MG tablet Take 10 mg by mouth at bedtime.      . Multiple Vitamin (MULTIVITAMIN) tablet Take 1 tablet by mouth daily.      Marland Kitchen NOVOLOG 100 UNIT/ML injection Inject 80 Units into the skin daily.      Marland Kitchen omega-3 acid ethyl esters (LOVAZA) 1 G capsule Take 1 g by mouth daily.      Marland Kitchen PRILOSEC OTC 20 MG tablet Take 1 tablet by mouth daily.       No current facility-administered medications for this visit.    No past medical history on file.  Past Surgical History  Procedure Laterality Date  . Carpal tunnel release    . Trigger finger release    . Cervical  discectomy      Family History  Problem Relation Age of Onset  . Hypertension Mother   . Alcoholism Father   . Cancer Maternal Grandfather   . Stroke Paternal Grandfather     History   Social History  . Marital Status: Married    Spouse Name: N/A    Number of Children: N/A  . Years of Education: N/A   Occupational History  . Not on file.   Social History Main Topics  . Smoking status: Never Smoker   . Smokeless tobacco: Not on file  . Alcohol Use: No  . Drug Use: No  . Sexual Activity: Not on file   Other Topics Concern  . Not on file   Social History Narrative  . No narrative on file    Review of systems: The patient specifically  denies paroxysmal nocturnal dyspnea, syncope, focal neurological deficits, intermittent claudication, lower extremity edema, unexplained weight gain, cough, hemoptysis or wheezing.  The patient also denies abdominal pain, nausea, vomiting, dysphagia, diarrhea, constipation, polyuria, polydipsia, dysuria, hematuria, frequency, urgency, abnormal bleeding or bruising, fever, chills, unexpected weight changes, mood swings, change in skin or hair texture, change in voice quality, auditory or visual problems, allergic reactions or rashes, new musculoskeletal complaints other than usual "aches and pains".   PHYSICAL EXAM BP 132/82  Pulse 77  Resp 16  Ht _0  (1.626 m)  Wt 94.257 kg (207 lb 12.8 oz)  BMI 35.65 kg/m2  General: Alert, oriented x3, no distress Head: no evidence of trauma, PERRL, EOMI, no exophtalmos or lid lag, no myxedema, no xanthelasma; normal ears, nose and oropharynx Neck: normal jugular venous pulsations and no hepatojugular reflux; brisk carotid pulses without delay and no carotid bruits Chest: clear to auscultation, no signs of consolidation by percussion or palpation, normal fremitus, symmetrical and full respiratory excursions Cardiovascular: normal position and quality of the apical impulse, regular rhythm, normal first and second heart sounds, grade 1/6 early peaking systolic ejection murmur no diastolic murmurs, rubs or gallops Abdomen: no tenderness or distention, no masses by palpation, no abnormal pulsatility or arterial bruits, normal bowel sounds, no hepatosplenomegaly Extremities: no clubbing, cyanosis or edema; 2+ radial, ulnar and brachial pulses bilaterally; 2+ right femoral, posterior tibial and dorsalis pedis pulses; 2+ left femoral, posterior tibial and dorsalis pedis pulses; no subclavian or femoral bruits Neurological: grossly nonfocal   EKG: Normal sinus rhythm, 1 mm downsloping ST depression and T-wave inversion in leads 1 and aVL, QTC 441 ms  Lipid Panel    December 2014 total cholesterol 187, LDL 120. Lipitor was increased to 40 mg at bedtime, no repeat since. Hemoglobin A1c 6.8% (August 2015) creatinine 0.9 (December 2014  BMET    Component Value Date/Time   NA 138 06/09/2011 1333   K 3.9 06/09/2011 1333   CL 100 06/09/2011 1333   CO2 29 06/09/2011 1333   GLUCOSE 118* 06/09/2011 1333   BUN 20 06/09/2011 1333   CREATININE 0.70 06/09/2011 1333   CALCIUM 9.8 06/09/2011 1333   GFRNONAA >60 06/09/2011 1333   GFRAA >60 06/09/2011 1333     ASSESSMENT AND PLAN  Mrs. Pianka is a symptoms are not typical, but are concerning for heart disease. She has exertional dyspnea and atypical chest pressure. Her electrocardiogram shows new ischemic appearing repolarization abnormalities in the lateral leads, which were not present in 2012. She is at high risk for multivessel CAD after 49 years of insulin requiring diabetes mellitus, as well as hypertension and hypercholesterolemia.  She  does not have symptoms to suggest unstable angina, therefore I suggested that she should have a treadmill Myoview study. A simple treadmill ECG stress test would be insufficient since she already has baseline ST segment depression in the study would be nondiagnostic.  Mrs. Cincotta also has a cardiac murmur. In the setting of ankylosing spondylitis one would were about aortic valve disease, but the murmur is more suggestive of aortic stenosis, rather than aortic insufficiency. I have recommended an echocardiogram.  Her episodes of "vertigo" are more likely related to hypotension. She has had systolic blood pressure documented to be around 98 mm Hg. it is possible that the diuretic effect of Invokana on top of her hydrochlorothiazide may be leading to intermittent hypotension. I am tempted to discontinue her hydrochlorothiazide. Will wait until she has her stress test and we will be able to see more blood pressure recordings.  She will have a repeat lipid profile performed in December with  Dr. Reynaldo Minium.  Follow up after she has her echo and stress test.  Orders Placed This Encounter  Procedures  . Myocardial Perfusion Imaging  . EKG 12-Lead  . 2D Echocardiogram without contrast   Meds ordered this encounter  Medications  . atorvastatin (LIPITOR) 40 MG tablet    Sig: Take 1 tablet by mouth daily.  . INVOKANA 100 MG TABS    Sig: Take 1 tablet by mouth daily.  Marland Kitchen levothyroxine (SYNTHROID, LEVOTHROID) 112 MCG tablet    Sig: Take 1 tablet by mouth daily.  . Diclofenac Sodium CR 100 MG 24 hr tablet    Sig: Take 1 tablet by mouth daily.  Marland Kitchen lisinopril-hydrochlorothiazide (PRINZIDE,ZESTORETIC) 10-12.5 MG per tablet    Sig: Take 1 tablet by mouth daily.  Marland Kitchen ADVAIR DISKUS 100-50 MCG/DOSE AEPB    Sig: Inhale 1 puff into the lungs daily.  Marland Kitchen NOVOLOG 100 UNIT/ML injection    Sig: Inject 80 Units into the skin daily.  Marland Kitchen ACCU-CHEK FASTCLIX LANCETS MISC    Sig:   . PRILOSEC OTC 20 MG tablet    Sig: Take 1 tablet by mouth daily.  . montelukast (SINGULAIR) 10 MG tablet    Sig: Take 10 mg by mouth at bedtime.  Marland Kitchen aspirin 81 MG tablet    Sig: Take 81 mg by mouth daily.  Marland Kitchen omega-3 acid ethyl esters (LOVAZA) 1 G capsule    Sig: Take 1 g by mouth daily.  . Multiple Vitamin (MULTIVITAMIN) tablet    Sig: Take 1 tablet by mouth daily.  . Calcium Carb-Cholecalciferol (CALCIUM 600/VITAMIN D3) 600-800 MG-UNIT TABS    Sig: Take 1 tablet by mouth daily.    Holli Humbles, MD, [place] [phone] office [phone] pager

## 2014-09-14 NOTE — Patient Instructions (Signed)
Your physician has requested that you have an echocardiogram. Echocardiography is a painless test that uses sound waves to create images of your heart. It provides your doctor with information about the size and shape of your heart and how well your heart's chambers and valves are working. This procedure takes approximately one hour. There are no restrictions for this procedure.  Your physician has requested that you have en exercise stress myoview. For further information please visit [email or web address]. Please follow instruction sheet, as given.  Dr. Royann Shiversroitoru recommends that you schedule a follow-up appointment in: After testing is complete.

## 2014-09-28 ENCOUNTER — Telehealth (HOSPITAL_COMMUNITY): Payer: Self-pay

## 2014-09-28 NOTE — Telephone Encounter (Signed)
Encounter complete. 

## 2014-09-29 ENCOUNTER — Telehealth (HOSPITAL_COMMUNITY): Payer: Self-pay

## 2014-09-29 NOTE — Telephone Encounter (Signed)
Encounter complete. 

## 2014-10-03 ENCOUNTER — Ambulatory Visit (HOSPITAL_BASED_OUTPATIENT_CLINIC_OR_DEPARTMENT_OTHER)
Admission: RE | Admit: 2014-10-03 | Discharge: 2014-10-03 | Disposition: A | Discharge status: Home / Self Care | Payer: BC Managed Care – PPO | Source: Ambulatory Visit | Attending: Cardiovascular Disease | Admitting: Cardiovascular Disease

## 2014-10-03 ENCOUNTER — Ambulatory Visit (HOSPITAL_COMMUNITY)
Admission: RE | Admit: 2014-10-03 | Discharge: 2014-10-03 | Disposition: A | Discharge status: Home / Self Care | Payer: BC Managed Care – PPO | Source: Ambulatory Visit | Attending: Cardiovascular Disease | Admitting: Cardiovascular Disease

## 2014-10-03 DIAGNOSIS — R079 Chest pain, unspecified: Secondary | ICD-10-CM | POA: Insufficient documentation

## 2014-10-03 DIAGNOSIS — I369 Nonrheumatic tricuspid valve disorder, unspecified: Secondary | ICD-10-CM

## 2014-10-03 DIAGNOSIS — E669 Obesity, unspecified: Secondary | ICD-10-CM | POA: Insufficient documentation

## 2014-10-03 DIAGNOSIS — E785 Hyperlipidemia, unspecified: Secondary | ICD-10-CM | POA: Diagnosis not present

## 2014-10-03 DIAGNOSIS — R0609 Other forms of dyspnea: Secondary | ICD-10-CM | POA: Insufficient documentation

## 2014-10-03 DIAGNOSIS — R011 Cardiac murmur, unspecified: Secondary | ICD-10-CM

## 2014-10-03 DIAGNOSIS — Z8249 Family history of ischemic heart disease and other diseases of the circulatory system: Secondary | ICD-10-CM | POA: Insufficient documentation

## 2014-10-03 DIAGNOSIS — R002 Palpitations: Secondary | ICD-10-CM | POA: Insufficient documentation

## 2014-10-03 DIAGNOSIS — E119 Type 2 diabetes mellitus without complications: Secondary | ICD-10-CM | POA: Diagnosis not present

## 2014-10-03 DIAGNOSIS — I10 Essential (primary) hypertension: Secondary | ICD-10-CM | POA: Diagnosis not present

## 2014-10-03 DIAGNOSIS — R0789 Other chest pain: Secondary | ICD-10-CM

## 2014-10-03 DIAGNOSIS — R0602 Shortness of breath: Secondary | ICD-10-CM

## 2014-10-03 MED ORDER — TECHNETIUM TC 99M SESTAMIBI GENERIC - CARDIOLITE
30.0000 | Freq: Once | INTRAVENOUS | Status: AC | PRN
  Administered 2014-10-03: 30 via INTRAVENOUS

## 2014-10-03 MED ORDER — TECHNETIUM TC 99M SESTAMIBI GENERIC - CARDIOLITE
10.0000 | Freq: Once | INTRAVENOUS | Status: AC | PRN
  Administered 2014-10-03: 10 via INTRAVENOUS

## 2014-10-03 NOTE — Procedures (Addendum)
[facility] [address] [place] [address] [phone]  Cardiology Nuclear Med Study  [name] is a 53 y.o. female     MRN : [id number]     DOB: [birth year]  Procedure Date: 10/03/2014  Nuclear Med Background Indication for Stress Test:  Evaluation for Ischemia and Abnormal EKG History:  No prior cardiac or respiratory history reported;No prior NUC MPI for comparison. Cardiac Risk Factors: Family History - CAD, Hypertension, IDDM Type 2, Lipids and Overweight  Symptoms:  Chest Pain, Dizziness, DOE, Fatigue, Light-Headedness and Palpitations   Nuclear Pre-Procedure Caffeine/Decaff Intake:  7:00pm NPO After: 5:00am   IV Site: R Forearm  IV 0.9% NS with Angio Cath:  22g  Chest Size (in):  n/a IV Started by: Berdie OgrenAmanda Wease, RN  Height: 5\' 4"  (1.626 m)  Cup Size: C  BMI:  Body mass index is 35.51 kg/(m^2). Weight:  207 lb (93.895 kg)   Tech Comments:  n/a    Nuclear Med Study 1 or 2 day study: 1 day  Stress Test Type:  Stress  Order Authorizing Provider:  Thurmon FairMihai Croitoru, MD   Resting Radionuclide: Technetium 8257m Sestamibi  Resting Radionuclide Dose: 10.9 mCi   Stress Radionuclide:  Technetium 6257m Sestamibi  Stress Radionuclide Dose: 31.0 mCi           Stress Protocol Rest HR: 74 Stress HR: 150  Rest BP: 125/69 Stress BP: 180/107  Exercise Time (min): 7 METS: 8.5   Predicted Max HR: 165 bpm % Max HR: 90.91 bpm Rate Pressure Product: [id number]  Dose of Adenosine (mg):  n/a Dose of Lexiscan: n/a mg  Dose of Atropine (mg): n/a Dose of Dobutamine: n/a mcg/kg/min (at max HR)  Stress Test Technologist: Esperanza Sheetserry-Marie Martin, CCT Nuclear Technologist: Koren Shiverobin Moffitt, CNMT   Rest Procedure:  Myocardial perfusion imaging was performed at rest 45 minutes following the intravenous administration of Technetium 2057m Sestamibi. Stress Procedure:  The patient performed treadmill exercise using a Bruce  Protocol for 7 minutes. The  patient stopped due to SOB and Fatigue and denied any chest pain.  There were no significant ST-T wave changes.  Technetium 9357m Sestamibi was injected IV at peak exercise and myocardial perfusion imaging was performed after a brief delay.  Transient Ischemic Dilatation (Normal <1.22):  0.95 QGS EDV:  58 ml QGS ESV:  14 ml LV Ejection Fraction: 75%       Rest ECG: NSR with non-specific ST-T wave changes  Stress ECG: Significant ST abnormalities consistent with ischemia.  QPS Raw Data Images:  Acquisition technically good; normal left ventricular size. Stress Images:  Normal homogeneous uptake in all areas of the myocardium. Rest Images:  Normal homogeneous uptake in all areas of the myocardium. Subtraction (SDS):  No evidence of ischemia.  Impression Exercise Capacity:  Fair exercise capacity. BP Response:  Normal blood pressure response. Clinical Symptoms:  There is dyspnea. ECG Impression:  Significant ST abnormalities consistent with ischemia. Comparison with Prior Nuclear Study: No previous nuclear study performed  Overall Impression:  Low risk stress nuclear study with ECG changes; however perfusion is normal with no ischemia or infarction.  LV Wall Motion:  NL LV Function; NL Wall Motion   Olga MillersBrian Crenshaw, MD  10/03/2014 11:27 AM

## 2014-10-03 NOTE — Progress Notes (Signed)
2D Echocardiogram Complete.  10/03/2014   Kaelah Hayashi, RDCS  

## 2014-10-04 ENCOUNTER — Telehealth: Payer: Self-pay | Admitting: *Deleted

## 2014-10-04 MED ORDER — FUROSEMIDE 20 MG PO TABS: 20.0000 mg | ORAL_TABLET | Freq: Every day | ORAL | Status: DC

## 2014-10-04 NOTE — Telephone Encounter (Signed)
Message copied by Vita BarleyLASSITER, Christyna Letendre A on Wed Oct 04, 2014  4:32 PM ------      Message from: Thurmon FairROITORU, MIHAI      Created: Tue Oct 03, 2014  5:12 PM       No significant problem with the aortic valve on echo. 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 Some indication that she may have diastolic dysfunction might benefit from diuretics. Britta MccreedyBarbara, Please have her take furosemide 20 mg daily between now and her followup appointment. ------

## 2014-10-04 NOTE — Telephone Encounter (Signed)
Echo and nuc results called to patient.  Will start Furosemide 20mg  daily until visit 10/18/14.  [facility].  Patient voiced understanding.

## 2014-10-18 ENCOUNTER — Ambulatory Visit (INDEPENDENT_AMBULATORY_CARE_PROVIDER_SITE_OTHER): Payer: BC Managed Care – PPO | Admitting: Cardiovascular Disease

## 2014-10-18 ENCOUNTER — Encounter: Payer: Self-pay | Admitting: Cardiovascular Disease

## 2014-10-18 VITALS — BP 124/68 | HR 60 | Resp 16 | Ht 64.0 in | Wt 207.1 lb

## 2014-10-18 DIAGNOSIS — R9431 Abnormal electrocardiogram [ECG] [EKG]: Secondary | ICD-10-CM

## 2014-10-18 DIAGNOSIS — I10 Essential (primary) hypertension: Secondary | ICD-10-CM

## 2014-10-18 DIAGNOSIS — E1059 Type 1 diabetes mellitus with other circulatory complications: Secondary | ICD-10-CM

## 2014-10-18 DIAGNOSIS — R011 Cardiac murmur, unspecified: Secondary | ICD-10-CM

## 2014-10-18 DIAGNOSIS — E1052 Type 1 diabetes mellitus with diabetic peripheral angiopathy with gangrene: Secondary | ICD-10-CM

## 2014-10-18 DIAGNOSIS — I5031 Acute diastolic (congestive) heart failure: Secondary | ICD-10-CM

## 2014-10-18 DIAGNOSIS — E7800 Pure hypercholesterolemia, unspecified: Secondary | ICD-10-CM

## 2014-10-18 NOTE — Patient Instructions (Signed)
Your physician recommends that you schedule a follow-up appointment in: 6 months  

## 2014-10-19 ENCOUNTER — Encounter: Payer: Self-pay | Admitting: Cardiovascular Disease

## 2014-10-19 DIAGNOSIS — I5032 Chronic diastolic (congestive) heart failure: Secondary | ICD-10-CM | POA: Insufficient documentation

## 2014-10-19 HISTORY — DX: Chronic diastolic (congestive) heart failure: I50.32

## 2014-10-19 NOTE — Assessment & Plan Note (Signed)
Good control

## 2014-10-19 NOTE — Assessment & Plan Note (Signed)
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 No serious valvular problems.

## 2014-10-19 NOTE — Progress Notes (Signed)
Patient ID: Wanda Hicks, female   DOB: Nov 27, 1961, 53 y.o.   MRN: [id number]     Reason for office visit Follow up echo and stress test  Wanda Hicks is a 53 year old woman with type 1 diabetes mellitus since age 31. She uses an insulin pump. Diabetes control has been generally good her entire life. She also has mild treated systemic hypertension and recently had her dose of statin increased for persistently elevated LDL cholesterol. She has a long-standing history of HLA-B27 positive ankylosing spondylitis and was recently found to have a heart murmur. She has been having occasional palpitations, increasing shortness of breath and recently has had frequent episodes of "vertigo".  She was referred for new ECG abnormalities, dyspnea and atypical chest pain. Thre echo showed evidence of diastolic heart failure (elevated filling pressures), but normal LVEF and no valve problems. The nuclear stress test was normal. She feels better after diuretic therapy.  No Known Allergies  Current Outpatient Prescriptions  Medication Sig Dispense Refill  . ACCU-CHEK AVIVA PLUS test strip   0  . ACCU-CHEK FASTCLIX LANCETS MISC     . ADVAIR DISKUS 100-50 MCG/DOSE AEPB Inhale 1 puff into the lungs daily.    Marland Kitchen aspirin 81 MG tablet Take 81 mg by mouth daily.    Marland Kitchen atorvastatin (LIPITOR) 40 MG tablet Take 1 tablet by mouth daily.    . Calcium Carb-Cholecalciferol (CALCIUM 600/VITAMIN D3) 600-800 MG-UNIT TABS Take 1 tablet by mouth daily.    . Diclofenac Sodium CR 100 MG 24 hr tablet Take 1 tablet by mouth daily.    Marland Kitchen FLUARIX QUADRIVALENT 0.5 ML injection   0  . furosemide (LASIX) 20 MG tablet Take 1 tablet (20 mg total) by mouth daily. 30 tablet 3  . INVOKANA 100 MG TABS Take 1 tablet by mouth daily.    Marland Kitchen levothyroxine (SYNTHROID, LEVOTHROID) 112 MCG tablet Take 1 tablet by mouth daily.    Marland Kitchen lisinopril-hydrochlorothiazide (PRINZIDE,ZESTORETIC) 10-12.5 MG per tablet Take 1 tablet by mouth daily.    . montelukast  (SINGULAIR) 10 MG tablet Take 10 mg by mouth at bedtime.    . Multiple Vitamin (MULTIVITAMIN) tablet Take 1 tablet by mouth daily.    Marland Kitchen NOVOLOG 100 UNIT/ML injection Inject 80 Units into the skin daily.    Marland Kitchen omega-3 acid ethyl esters (LOVAZA) 1 G capsule Take 1 g by mouth daily.    Marland Kitchen PRILOSEC OTC 20 MG tablet Take 1 tablet by mouth daily.     No current facility-administered medications for this visit.    No past medical history on file.  Past Surgical History  Procedure Laterality Date  . Carpal tunnel release    . Trigger finger release    . Cervical discectomy      Family History  Problem Relation Age of Onset  . Hypertension Mother   . Alcoholism Father   . Cancer Maternal Grandfather   . Stroke Paternal Grandfather     History   Social History  . Marital Status: Married    Spouse Name: N/A    Number of Children: N/A  . Years of Education: N/A   Occupational History  . Not on file.   Social History Main Topics  . Smoking status: Never Smoker   . Smokeless tobacco: Not on file  . Alcohol Use: No  . Drug Use: No  . Sexual Activity: Not on file   Other Topics Concern  . Not on file   Social History Narrative  Review of systems: The patient specifically denies paroxysmal nocturnal dyspnea, syncope, focal neurological deficits, intermittent claudication, lower extremity edema, unexplained weight gain, cough, hemoptysis or wheezing.  The patient also denies abdominal pain, nausea, vomiting, dysphagia, diarrhea, constipation, polyuria, polydipsia, dysuria, hematuria, frequency, urgency, abnormal bleeding or bruising, fever, chills, unexpected weight changes, mood swings, change in skin or hair texture, change in voice quality, auditory or visual problems, allergic reactions or rashes, new musculoskeletal complaints other than usual "aches and pains".  PHYSICAL EXAM BP 124/68 mmHg  Pulse 60  Resp 16  Ht _0  (1.626 m)  Wt 207 lb 1.6 oz (93.94 kg)  BMI 35.53  kg/m2 General: Alert, oriented x3, no distress Head: no evidence of trauma, PERRL, EOMI, no exophtalmos or lid lag, no myxedema, no xanthelasma; normal ears, nose and oropharynx Neck: normal jugular venous pulsations and no hepatojugular reflux; brisk carotid pulses without delay and no carotid bruits Chest: clear to auscultation, no signs of consolidation by percussion or palpation, normal fremitus, symmetrical and full respiratory excursions Cardiovascular: normal position and quality of the apical impulse, regular rhythm, normal first and second heart sounds, grade 1/6 early peaking systolic ejection murmur no diastolic murmurs, rubs or gallops Abdomen: no tenderness or distention, no masses by palpation, no abnormal pulsatility or arterial bruits, normal bowel sounds, no hepatosplenomegaly Extremities: no clubbing, cyanosis or edema; 2+ radial, ulnar and brachial pulses bilaterally; 2+ right femoral, posterior tibial and dorsalis pedis pulses; 2+ left femoral, posterior tibial and dorsalis pedis pulses; no subclavian or femoral bruits Neurological: grossly nonfocal  Lipid Panel  December 2014 total cholesterol 187, LDL 120. Lipitor was increased to 40 mg at bedtime, no repeat since. Hemoglobin A1c 6.8% (August 2015) creatinine 0.9 (December 2014) BMET    Component Value Date/Time   NA 138 06/09/2011 1333   K 3.9 06/09/2011 1333   CL 100 06/09/2011 1333   CO2 29 06/09/2011 1333   GLUCOSE 118* 06/09/2011 1333   BUN 20 06/09/2011 1333   CREATININE 0.70 06/09/2011 1333   CALCIUM 9.8 06/09/2011 1333   GFRNONAA >60 06/09/2011 1333   GFRAA >60 06/09/2011 1333     ASSESSMENT AND PLAN Abnormal ECG Reassuring stress test. Although we may be missing minor CAD, highly unlikely she has severe CAD. Medical management. Currently asymptomatic.  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 No serious valvular problems.  Acute diastolic heart failure Improved with diuretics. Discussed the  concept of "dry weight"/optimal volemic status and the fact that establishing it is usually via trial and error. Encouraged her to continue daily weights and sodium restriction. Watch carefully for dehydration/electrolyte abnormalities since she takes a thiazide and Invokana.  HTN (hypertension) Good control.  Hypercholesteremia She is due a repeat profile after the increase in her statin dose.Target preferably LDL<70, definitely <100.   Patient Instructions  Your physician recommends that you schedule a follow-up appointment in: 6 months     No orders of the defined types were placed in this encounter.   Meds ordered this encounter  Medications  . FLUARIX QUADRIVALENT 0.5 ML injection    Sig:     Refill:  0  . ACCU-CHEK AVIVA PLUS test strip    Sig:     Refill:  0    Wanda Hicks  Sanda Klein, MD, [facility] [phone] office [phone] pager

## 2014-10-19 NOTE — Assessment & Plan Note (Signed)
Improved with diuretics. Discussed the concept of "dry weight"/optimal volemic status and the fact that establishing it is usually via trial and error. Encouraged her to continue daily weights and sodium restriction. Watch carefully for dehydration/electrolyte abnormalities since she takes a thiazide and Invokana.

## 2014-10-19 NOTE — Assessment & Plan Note (Signed)
Reassuring stress test. Although we may be missing minor CAD, highly unlikely she has severe CAD. Medical management. Currently asymptomatic.

## 2014-10-19 NOTE — Assessment & Plan Note (Signed)
She is due a repeat profile after the increase in her statin dose.Target preferably LDL<70, definitely <100.

## 2015-01-07 ENCOUNTER — Encounter: Payer: Self-pay | Admitting: Cardiovascular Disease

## 2015-01-08 ENCOUNTER — Telehealth: Payer: Self-pay | Admitting: Cardiovascular Disease

## 2015-01-08 NOTE — Telephone Encounter (Signed)
Pt called in stating that she just had surgery on 1/15 and since then she has been having some increased weakness, and SOB. She says that she went to her PCP on 1/20 and he heard a louder murmur than when she was there earlier in the month. Pt is concerned. Please call back  Thanks

## 2015-01-08 NOTE — Telephone Encounter (Signed)
Returned call to patient.She stated she has been having fast heart beat for the past couple of days.Pulse ranging 110,100 bpm.B/P 140/80.Stated she has been light headed,weakness and sob.Stated she had surgery 12/29/14 C7 Diskectomy by Dr.Richard Jacky KindleAronson.Stated at her post op visit he heard a louder heart murmur.Appointment scheduled with Dr.Croitoru 01/11/15 at 10:45 am.

## 2015-01-11 ENCOUNTER — Ambulatory Visit (INDEPENDENT_AMBULATORY_CARE_PROVIDER_SITE_OTHER): Payer: BC Managed Care – PPO | Admitting: Cardiovascular Disease

## 2015-01-11 ENCOUNTER — Encounter: Payer: Self-pay | Admitting: Cardiovascular Disease

## 2015-01-11 VITALS — BP 120/70 | HR 93 | Resp 16 | Ht 64.0 in | Wt 205.0 lb

## 2015-01-11 DIAGNOSIS — R9431 Abnormal electrocardiogram [ECG] [EKG]: Secondary | ICD-10-CM

## 2015-01-11 MED ORDER — LOSARTAN POTASSIUM 50 MG PO TABS: 50.0000 mg | ORAL_TABLET | Freq: Every day | ORAL | Status: DC

## 2015-01-11 NOTE — Progress Notes (Signed)
Patient ID: Wanda Hicks, female   DOB: 04-14-1961, 54 y.o.   MRN: [id number]      Reason for office visit Chronic diastolic heart failure, systemic hypertension, hyperlipidemia, diabetes mellitus  Mrs. Wanda Hicks is a 54 year old woman with systemic hypertension, hyperlipidemia, type 1 diabetes mellitus (all well-controlled) and ankylosing spondylitis and a cardiac murmur who was found by echocardiographic criteria to have elevated mean left atrial pressure. She had a nagging dry cough that seemed to improve after furosemide was initiated.  January 15 she had cervical spine surgery and since then his cough has restarted. However she has dropped a couple of pounds of weight, does not have dyspnea and it is possible that the current cough is related to endotracheal intubation/anesthesia.  She has had fairly frequent dizzy spells. In the late afternoons her blood pressures often in the 90s/60s range. When she wakes up in the morning her blood pressure is usually high normal.  Her echocardiogram also showed that her systolic murmur is related to aortic valve sclerosis in that there are no serious valve abnormalities. She had a normal nuclear stress test in 2015.  No Known Allergies  Current Outpatient Prescriptions  Medication Sig Dispense Refill  . ACCU-CHEK AVIVA PLUS test strip   0  . ACCU-CHEK FASTCLIX LANCETS MISC     . ADVAIR DISKUS 100-50 MCG/DOSE AEPB Inhale 1 puff into the lungs daily.    Marland Kitchen. aspirin 81 MG tablet Take 81 mg by mouth daily.    Marland Kitchen. atorvastatin (LIPITOR) 40 MG tablet Take 1 tablet by mouth daily.    . Calcium Carb-Cholecalciferol (CALCIUM 600/VITAMIN D3) 600-800 MG-UNIT TABS Take 1 tablet by mouth daily.    . cyclobenzaprine (FLEXERIL) 10 MG tablet Take 10 mg by mouth 3 (three) times daily as needed.  0  . Diclofenac Sodium CR 100 MG 24 hr tablet Take 1 tablet by mouth daily.    Marland Kitchen. FLUARIX QUADRIVALENT 0.5 ML injection   0  . fluconazole (DIFLUCAN) 150 MG tablet Take 150 mg by  mouth as needed.   0  . furosemide (LASIX) 20 MG tablet Take 1 tablet (20 mg total) by mouth daily. 30 tablet 3  . gabapentin (NEURONTIN) 300 MG capsule Take 300 mg by mouth every 8 (eight) hours.   0  . INVOKANA 100 MG TABS Take 1 tablet by mouth daily.    Marland Kitchen. levothyroxine (SYNTHROID, LEVOTHROID) 112 MCG tablet Take 1 tablet by mouth daily.    . montelukast (SINGULAIR) 10 MG tablet Take 10 mg by mouth at bedtime.    . Multiple Vitamin (MULTIVITAMIN) tablet Take 1 tablet by mouth daily.    Marland Kitchen. NOVOLOG 100 UNIT/ML injection Inject 80 Units into the skin daily.    Marland Kitchen. omega-3 acid ethyl esters (LOVAZA) 1 G capsule Take 1 g by mouth daily.    Marland Kitchen. PRILOSEC OTC 20 MG tablet Take 1 tablet by mouth daily.    Marland Kitchen. losartan (COZAAR) 50 MG tablet Take 1 tablet (50 mg total) by mouth daily. 90 tablet 3   No current facility-administered medications for this visit.    No past medical history on file.  Past Surgical History  Procedure Laterality Date  . Carpal tunnel release    . Trigger finger release    . Cervical discectomy      Family History  Problem Relation Age of Onset  . Hypertension Mother   . Alcoholism Father   . Cancer Maternal Grandfather   . Stroke Paternal Grandfather  History   Social History  . Marital Status: Married    Spouse Name: N/A    Number of Children: N/A  . Years of Education: N/A   Occupational History  . Not on file.   Social History Main Topics  . Smoking status: Never Smoker   . Smokeless tobacco: Not on file  . Alcohol Use: No  . Drug Use: No  . Sexual Activity: Not on file   Other Topics Concern  . Not on file   Social History Narrative    Review of systems: The patient specifically denies any chest pain at rest or with exertion, dyspnea at rest or with exertion, orthopnea, paroxysmal nocturnal dyspnea, syncope, palpitations, focal neurological deficits, intermittent claudication, lower extremity edema, unexplained weight gain, hemoptysis or  wheezing.  The patient also denies abdominal pain, nausea, vomiting, dysphagia, diarrhea, constipation, polyuria, polydipsia, dysuria, hematuria, frequency, urgency, abnormal bleeding or bruising, fever, chills, unexpected weight changes, mood swings, change in skin or hair texture, change in voice quality, auditory or visual problems, allergic reactions or rashes, new musculoskeletal complaints other than usual "aches and pains".   PHYSICAL EXAM BP 120/70 mmHg  Pulse 93  Resp 16  Ht  (1.626 m)  Wt 205 lb (92.987 kg)  BMI 35.17 kg/m2 Wearing a cervical collar General: Alert, oriented x3, no distress Head: no evidence of trauma, PERRL, EOMI, no exophtalmos or lid lag, no myxedema, no xanthelasma; normal ears, nose and oropharynx Neck: normal jugular venous pulsations and no hepatojugular reflux; brisk carotid pulses without delay and no carotid bruits Chest: clear to auscultation, no signs of consolidation by percussion or palpation, normal fremitus, symmetrical and full respiratory excursions Cardiovascular: normal position and quality of the apical impulse, regular rhythm, normal first and second heart sounds, no murmurs, rubs or gallops Abdomen: no tenderness or distention, no masses by palpation, no abnormal pulsatility or arterial bruits, normal bowel sounds, no hepatosplenomegaly Extremities: no clubbing, cyanosis or edema; 2+ radial, ulnar and brachial pulses bilaterally; 2+ right femoral, posterior tibial and dorsalis pedis pulses; 2+ left femoral, posterior tibial and dorsalis pedis pulses; no subclavian or femoral bruits Neurological: grossly nonfocal   EKG: Normal sinus rhythm, mildly prolonged QTC at 480 ms, the inversion of T waves in leads 1 and aVL is much less obvious on the current ECG  Labs Total cholesterol 171, triglycerides 180, HDL 35, LDL 100 Hemoglobin A1c 6.1% January 21 sodium 139, potassium 4.5, BUN 27, creatinine 1.1, glucose 139    ASSESSMENT AND  PLAN  Abnormal ECG Reassuring stress test. T-wave changes less significant. No symptoms of coronary insufficiency.  Murmur Due to aortic valve sclerosis. No serious valvular problems.  Diastolic heart failure Improved with diuretics. She is under our previous estimated dry weight. Her BUN is slightly elevated and she has dizziness and borderline low blood pressure in the afternoon. She may actually be slightly hypovolemic. Would like to stop her hydrochlorothiazide. Her ECG is concerning for low potassium levels with a prolonged QT, not withstanding the normal potassium on January 21.   HTN (hypertension) Since I'm changing her blood pressure medications I'll also take this opportunity to replace her lisinopril with losartan. This would take away the possibility that she has an ACE inhibitor related cough and should be at least as effective in prevention of nephropathy.  Hypercholesteremia Repeat lipid profile is satisfactory, but not ideal.Target preferably LDL<70, definitely <100.  Orders Placed This Encounter  Procedures  . EKG 12-Lead   Meds ordered this encounter  Medications  . gabapentin (NEURONTIN) 300 MG capsule    Sig: Take 300 mg by mouth every 8 (eight) hours.     Refill:  0  . cyclobenzaprine (FLEXERIL) 10 MG tablet    Sig: Take 10 mg by mouth 3 (three) times daily as needed.    Refill:  0  . fluconazole (DIFLUCAN) 150 MG tablet    Sig: Take 150 mg by mouth as needed.     Refill:  0  . losartan (COZAAR) 50 MG tablet    Sig: Take 1 tablet (50 mg total) by mouth daily.    Dispense:  90 tablet    Refill:  3    Momen Ham  Thurmon Fair, MD, [facility] [phone] office [phone] pager

## 2015-01-11 NOTE — Patient Instructions (Signed)
STOP the Zestoril .  START Losartan 50mg  daily.  Dr. Royann Shiversroitoru recommends that you schedule a follow-up appointment in: 2 months.

## 2015-02-06 ENCOUNTER — Other Ambulatory Visit: Payer: Self-pay

## 2015-02-06 MED ORDER — FUROSEMIDE 20 MG PO TABS: 20.0000 mg | ORAL_TABLET | Freq: Every day | ORAL | Status: DC

## 2015-02-06 NOTE — Telephone Encounter (Signed)
Rx(s) sent to pharmacy electronically.  

## 2015-03-12 ENCOUNTER — Encounter: Payer: Self-pay | Admitting: Cardiovascular Disease

## 2015-03-12 ENCOUNTER — Ambulatory Visit (INDEPENDENT_AMBULATORY_CARE_PROVIDER_SITE_OTHER): Payer: BC Managed Care – PPO | Admitting: Cardiovascular Disease

## 2015-03-12 VITALS — BP 132/70 | HR 96 | Resp 16 | Ht 64.0 in | Wt 207.6 lb

## 2015-03-12 DIAGNOSIS — E7800 Pure hypercholesterolemia, unspecified: Secondary | ICD-10-CM

## 2015-03-12 DIAGNOSIS — I10 Essential (primary) hypertension: Secondary | ICD-10-CM

## 2015-03-12 DIAGNOSIS — I5031 Acute diastolic (congestive) heart failure: Secondary | ICD-10-CM

## 2015-03-12 MED ORDER — LOSARTAN POTASSIUM 50 MG PO TABS: 50.0000 mg | ORAL_TABLET | Freq: Every day | ORAL | Status: DC

## 2015-03-12 MED ORDER — ROSUVASTATIN CALCIUM 20 MG PO TABS: 20.0000 mg | ORAL_TABLET | Freq: Every day | ORAL | Status: DC

## 2015-03-12 MED ORDER — FUROSEMIDE 20 MG PO TABS: 20.0000 mg | ORAL_TABLET | Freq: Every day | ORAL | Status: DC

## 2015-03-12 NOTE — Progress Notes (Signed)
Patient ID: Wanda RunningSuzanne M Fritze, female   DOB: 19-Feb-1961, 54 y.o.   MRN: [id number]     Cardiology Office Note   Date:  03/12/2015   ID:  Wanda RunningSuzanne M Scheirer, DOB 19-Feb-1961, MRN [id number]  PCP:  Minda MeoARONSON,RICHARD A, MD  Cardiologist:   Thurmon FairROITORU,Bryar Rennie, MD   Chief Complaint  Patient presents with  . Follow-up    2 months:  Dr. Jacky KindleAronson stopped Lipitor and started Crestor due to hip pain.  No chest pain, no SOB, minimal edema.      History of Present Illness: Wanda Hicks is a 54 y.o. female who presents for repeat evaluation for diastolic heart failure in the setting of hypertension, hyperlipidemia, type I diabetes mellitus (all risk factors that are well controlled). She feels much better. 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 This is also resolving at this time. She has occasional mild to moderate ankle edema. Her blood pressure appears to be well controlled. She no longer has had problems with hypotension and dizziness. She transitioned from lisinopril to losartan without side effects.  She has had some problems with right hip pain. She wonders whether these are statin related, although she does have arthritis in a variety of locations. Dr. Jacky KindleAronson stopped atorvastatin and she is now taking Crestor 20 mg daily. She believes that her pain resolved completely when she had been off the Lipitor for about 1-1/2 weeks. It has now recurred but is much less severe on Crestor.  She has no cardiovascular complaints.  Her echocardiogram also showed that her systolic murmur is related to aortic valve sclerosis in that there are no serious valve abnormalities. She had a normal nuclear stress test in 2015.  Past medical history: Type I (insulin-dependent) diabetes mellitus, hypertension, hyperlipidemia, diastolic heart failure.  Past Surgical History  Procedure Laterality Date  . Carpal tunnel release     . Trigger finger release    . Cervical discectomy       Current Outpatient Prescriptions  Medication Sig Dispense Refill  . ACCU-CHEK AVIVA PLUS test strip   0  . ACCU-CHEK FASTCLIX LANCETS MISC     . ADVAIR DISKUS 100-50 MCG/DOSE AEPB Inhale 1 puff into the lungs daily.    Marland Kitchen. aspirin 81 MG tablet Take 81 mg by mouth daily.    . Calcium Carb-Cholecalciferol (CALCIUM 600/VITAMIN D3) 600-800 MG-UNIT TABS Take 1 tablet by mouth daily.    . cyclobenzaprine (FLEXERIL) 10 MG tablet Take 10 mg by mouth 3 (three) times daily as needed.  0  . Diclofenac Sodium CR 100 MG 24 hr tablet Take 1 tablet by mouth daily.    Marland Kitchen. FLUARIX QUADRIVALENT 0.5 ML injection   0  . fluconazole (DIFLUCAN) 150 MG tablet Take 150 mg by mouth as needed.   0  . furosemide (LASIX) 20 MG tablet Take 1 tablet (20 mg total) by mouth daily. 90 tablet 3  . gabapentin (NEURONTIN) 300 MG capsule Take 300 mg by mouth every 8 (eight) hours.   0  . INVOKANA 100 MG TABS Take 1 tablet by mouth daily.    Marland Kitchen. levothyroxine (SYNTHROID, LEVOTHROID) 112 MCG tablet Take 1 tablet by mouth daily.    Marland Kitchen. loratadine (CLARITIN) 10 MG tablet Take 10 mg by mouth daily.    Marland Kitchen. losartan (COZAAR) 50 MG tablet Take 1 tablet (50 mg total) by mouth daily. 90 tablet 3  . montelukast (SINGULAIR)  10 MG tablet Take 10 mg by mouth at bedtime.    . Multiple Vitamin (MULTIVITAMIN) tablet Take 1 tablet by mouth daily.    Marland Kitchen NOVOLOG 100 UNIT/ML injection Inject 80 Units into the skin daily.    Marland Kitchen omega-3 acid ethyl esters (LOVAZA) 1 G capsule Take 1 g by mouth daily.    Marland Kitchen PRILOSEC OTC 20 MG tablet Take 1 tablet by mouth daily.    . rosuvastatin (CRESTOR) 20 MG tablet Take 1 tablet (20 mg total) by mouth daily. 30 tablet 0  . rosuvastatin (CRESTOR) 20 MG tablet Take 1 tablet (20 mg total) by mouth daily. 90 tablet 3   No current facility-administered medications for this visit.    Allergies:   Review of patient's allergies indicates no known allergies.    Social  History:  The patient  reports that she has never smoked. She does not have any smokeless tobacco history on file. She reports that she does not drink alcohol or use illicit drugs.   Family History:  The patient's family history includes Alcoholism in her father; Cancer in her maternal grandfather; Hypertension in her mother; Stroke in her paternal grandfather.    ROS:  Please see the history of present illness.    The patient specifically denies any chest pain at rest or with exertion, dyspnea at rest or with exertion, orthopnea, paroxysmal nocturnal dyspnea, syncope, palpitations, focal neurological deficits, intermittent claudication, lunexplained weight gain, hemoptysis or wheezing.  The patient also denies abdominal pain, nausea, vomiting, dysphagia, diarrhea, constipation, polyuria, polydipsia, dysuria, hematuria, frequency, urgency, abnormal bleeding or bruising, fever, chills, unexpected weight changes, mood swings, change in skin or hair texture, change in voice quality, auditory or visual problems, allergic reactions or rashes, new musculoskeletal complaints other than usual "aches and pains"..   All other systems are reviewed and negative.    PHYSICAL EXAM: VS:  BP 132/70 mmHg  Pulse 96  Resp 16  Ht  (1.626 m)  Wt 207 lb 9.6 oz (94.167 kg)  BMI 35.62 kg/m2 , BMI Body mass index is 35.62 kg/(m^2).  General: Alert, oriented x3, no distress Head: no evidence of trauma, PERRL, EOMI, no exophtalmos or lid lag, no myxedema, no xanthelasma; normal ears, nose and oropharynx Neck: normal jugular venous pulsations and no hepatojugular reflux; brisk carotid pulses without delay and no carotid bruits Chest: clear to auscultation, no signs of consolidation by percussion or palpation, normal fremitus, symmetrical and full respiratory excursions Cardiovascular: normal position and quality of the apical impulse, regular rhythm, normal first and second heart sounds, 1-2/6 early peaking  systolic ejection murmur in the aortic focus, rubs or gallops Abdomen: no tenderness or distention, no masses by palpation, no abnormal pulsatility or arterial bruits, normal bowel sounds, no hepatosplenomegaly Extremities: no clubbing, cyanosis or edema; 2+ radial, ulnar and brachial pulses bilaterally; 2+ right femoral, posterior tibial and dorsalis pedis pulses; 2+ left femoral, posterior tibial and dorsalis pedis pulses; no subclavian or femoral bruits Neurological: grossly nonfocal Psych: euthymic mood, full affect   EKG:  EKG is not ordered today.   Recent Labs: No results found for requested labs within last 365 days.    Lipid Panel No results found for: CHOL, TRIG, HDL, CHOLHDL, VLDL, LDLCALC, LDLDIRECT    Wt Readings from Last 3 Encounters:  03/12/15 207 lb 9.6 oz (94.167 kg)  01/11/15 205 lb (92.987 kg)  10/18/14 207 lb 1.6 oz (93.94 kg)    ASSESSMENT AND PLAN:  Diastolic heart failure Improved with  substitution of HCTZ with loop diuretics. Okay to make small adjustments in diuretic dose as needed for control of edema  HTN (hypertension) Blood pressure is in the target range on the current regimen.  Aortic valve sclerosis murmur  Hyperlipidemia I suspect her hip pain is more likely due to local degenerative problems rather than statin side effects. Regardless she seems to tolerate Crestor better than Lipitor. We'll give her prescription for long-term use of Crestor. Follow-up labs with Dr. Jacky Kindle.  Current medicines are reviewed at length with the patient today.  The patient does not have concerns regarding medicines.  The following changes have been made:  no change  Labs/ tests ordered today include:  No orders of the defined types were placed in this encounter.   Patient Instructions  Dr. Royann Shivers recommends that you schedule a follow-up appointment in: One year.      Joie Bimler, MD  03/12/2015 5:17 PM    Thurmon Fair, MD, [facility]  [facility] [phone] office [phone] pager

## 2015-03-12 NOTE — Patient Instructions (Signed)
Dr. Croitoru recommends that you schedule a follow-up appointment in: One year.   

## 2015-08-01 ENCOUNTER — Ambulatory Visit (INDEPENDENT_AMBULATORY_CARE_PROVIDER_SITE_OTHER): Payer: BC Managed Care – PPO

## 2015-08-01 ENCOUNTER — Ambulatory Visit (INDEPENDENT_AMBULATORY_CARE_PROVIDER_SITE_OTHER): Payer: BC Managed Care – PPO | Admitting: Podiatry

## 2015-08-01 VITALS — BP 125/72 | HR 74 | Resp 16

## 2015-08-01 DIAGNOSIS — M205X1 Other deformities of toe(s) (acquired), right foot: Secondary | ICD-10-CM

## 2015-08-01 DIAGNOSIS — M79671 Pain in right foot: Secondary | ICD-10-CM

## 2015-08-01 DIAGNOSIS — Z0189 Encounter for other specified special examinations: Secondary | ICD-10-CM | POA: Diagnosis not present

## 2015-08-01 DIAGNOSIS — M722 Plantar fascial fibromatosis: Secondary | ICD-10-CM

## 2015-08-01 MED ORDER — TRIAMCINOLONE ACETONIDE 10 MG/ML IJ SUSP
10.0000 mg | Freq: Once | INTRAMUSCULAR | Status: AC
  Administered 2015-08-01: 10 mg

## 2015-08-01 NOTE — Progress Notes (Signed)
   Subjective:    Patient ID: Wanda Hicks, female    DOB: 1961-12-10, 54 y.o.   MRN: [id number]  HPI Patient presents with foot pain, right foot; side of heel, medial side. This has been going on for the past 2 months. Pt stated, "Pain is getting worse". Has been icing foot with some relief.   Review of Systems  All other systems reviewed and are negative.      Objective:   Physical Exam        Assessment & Plan:

## 2015-08-01 NOTE — Progress Notes (Signed)
Subjective:     Patient ID: Wanda Hicks, female   DOB: 1961-06-25, 54 y.o.   MRN: [id number]  HPI patient presents with significant heel pain right of at least 2 months duration. History of it on the left foot but first attack she's had on the right and also has problems with her big toe joint on the right foot   Review of Systems  All other systems reviewed and are negative.      Objective:   Physical Exam  Constitutional: She is oriented to person, place, and time.  Cardiovascular: Intact distal pulses.   Musculoskeletal: Normal range of motion.  Neurological: She is oriented to person, place, and time.  Skin: Skin is warm.  Nursing note and vitals reviewed.  neurovascular status intact with muscle strength adequate range of motion within normal limits except for significant reduction of motion first MPJ right with crepitus and pain within the joint that she states is present all the time and severe discomfort in the right plantar fascia. With moderate depression of the arch patient is noted to have good digital perfusion is well oriented 3 and has no equinus condition     Assessment:      plantar fasciitis right with chronic hallux limitus rigidus deformity right    Plan:      H&P conditions reviewed and x-rays reviewed. Today I injected the right plantar fascia 3 mg Kenalog 5 mill grams Xylocaine and applied fascial brace and instructed on physical therapy. Continue diclofenac

## 2015-08-01 NOTE — Patient Instructions (Addendum)

## 2015-08-07 ENCOUNTER — Encounter: Payer: Self-pay | Admitting: Podiatry

## 2015-08-07 ENCOUNTER — Ambulatory Visit (INDEPENDENT_AMBULATORY_CARE_PROVIDER_SITE_OTHER): Payer: BC Managed Care – PPO | Admitting: Podiatry

## 2015-08-07 VITALS — BP 135/72 | HR 71 | Resp 16

## 2015-08-07 DIAGNOSIS — M722 Plantar fascial fibromatosis: Secondary | ICD-10-CM

## 2015-08-10 NOTE — Progress Notes (Signed)
Subjective:     Patient ID: Wanda Hicks, female   DOB: 07-Nov-1961, 54 y.o.   MRN: [id number]  HPI patient states I still have pain in my heel but there is been some improvement from previous   Review of Systems     Objective:   Physical Exam Neurovascular status intact with continued discomfort in the right heel of moderate nature with depression of the arch noted upon weightbearing    Assessment:     Structural changes with inflammation still noted secondary to foot structure    Plan:     Reviewed physical therapy and the utilization of supportive shoes. I did go ahead and scanned for a Berkley type orthotic in order to support the plantar arch and reduce stress against the heel and distributed to the rest of the foot. Patient will be seen back when they are ready

## 2015-08-28 ENCOUNTER — Ambulatory Visit: Payer: BC Managed Care – PPO | Admitting: *Deleted

## 2015-08-28 DIAGNOSIS — M722 Plantar fascial fibromatosis: Secondary | ICD-10-CM

## 2015-08-28 NOTE — Patient Instructions (Signed)

## 2015-08-29 NOTE — Progress Notes (Signed)
Patient ID: Wanda Hicks, female   DOB: December 18, 1960, 54 y.o.   MRN: [id number] Patient presents for orthotic pick up.  Verbal and written break in and wear instructions given.  Patient will follow up in 4 weeks if symptoms worsen or fail to improve.

## 2016-01-03 ENCOUNTER — Encounter: Payer: Self-pay | Admitting: Gastroenterology

## 2016-02-05 ENCOUNTER — Other Ambulatory Visit: Payer: Self-pay | Admitting: *Deleted

## 2016-02-05 MED ORDER — LOSARTAN POTASSIUM 50 MG PO TABS: 50.0000 mg | ORAL_TABLET | Freq: Every day | ORAL | Status: DC

## 2016-02-05 NOTE — Telephone Encounter (Signed)
Walk-in. Refill done at pt request. Pt aware of recommendation to be re-seen annually for appt - she did not want to do this today d/t needing to get to other appt in building, but will call to schedule.

## 2016-02-13 ENCOUNTER — Other Ambulatory Visit: Payer: Self-pay | Admitting: Cardiovascular Disease

## 2016-02-13 NOTE — Telephone Encounter (Signed)
Rx(s) sent to pharmacy electronically.  

## 2016-04-11 ENCOUNTER — Other Ambulatory Visit: Payer: Self-pay | Admitting: Internal Medicine

## 2016-04-11 DIAGNOSIS — M545 Low back pain: Secondary | ICD-10-CM

## 2016-04-11 DIAGNOSIS — M459 Ankylosing spondylitis of unspecified sites in spine: Secondary | ICD-10-CM

## 2016-04-22 ENCOUNTER — Other Ambulatory Visit: Payer: Self-pay

## 2016-04-24 ENCOUNTER — Telehealth: Payer: Self-pay | Admitting: Cardiovascular Disease

## 2016-04-24 MED ORDER — FUROSEMIDE 20 MG PO TABS: 20.0000 mg | ORAL_TABLET | Freq: Every day | ORAL | Status: DC

## 2016-04-24 NOTE — Telephone Encounter (Signed)
*  STAT* If patient is at the pharmacy, call can be transferred to refill team.  Pt wanted Rx for Lasix changed from mail order to [facility] Please advise  1. Which medications need to be refilled? (please list name of each medication and dose if known) Lasix 10mg    2. Which pharmacy/location (including street and city if local pharmacy) is medication to be sent to? [facility] [place]  3. Do they need a 30 day or 90 day supply? 90

## 2016-04-24 NOTE — Telephone Encounter (Signed)
E-SENT PRESCRIPTION TO [facility]  #[facility]  NO REFILLS  SCHEDULE APPOINTMENT 05/2016

## 2016-05-22 ENCOUNTER — Other Ambulatory Visit: Payer: Self-pay | Admitting: Obstetrics

## 2016-05-26 ENCOUNTER — Other Ambulatory Visit: Payer: Self-pay | Admitting: Neurosurgery

## 2016-06-18 NOTE — Pre-Procedure Instructions (Signed)
[name] Caponigro  06/18/2016     Your procedure is scheduled on : Friday June 27, 2016 at 7:30 AM.  [facility]:30 AM.  Call this number if you have problems the morning of surgery: [phone]    Remember:  Do not eat food or drink liquids after midnight.  Take these medicines the morning of surgery with A SIP OF WATER : Acetaminophen (Tylenol), Albuterol inhaler if needed (bring inhaler with you), Advair inhaler if needed, Gabapentin (Neurontin), Levothyroxine (Synthroid), Loradtadine (Claritin) if needed, Flonase nasal spray, Tramadol (Ultram) if needed   Stop taking any vitamins, herbal medications/supplements, NSAIDs, Ibuprofen, Advil, Motrin, Aleve, Omega 3/Fish Oil, Krill Oil, Diclofenac, etc on Friday July 7th   Do NOT take any diabetic pills the morning of your surgery (NO Jardiance)   Please contact your PCP or Endocrinologist about insulin pump, OR reduce all basal rates by 20% at midnight    How to Manage Your Diabetes Before and After Surgery  Why is it important to control my blood sugar before and after surgery? . Improving blood sugar levels before and after surgery helps healing and can limit problems. . A way of improving blood sugar control is eating a healthy diet by: o  Eating less sugar and carbohydrates o  Increasing activity/exercise o  Talking with your doctor about reaching your blood sugar goals . High blood sugars (greater than 180 mg/dL) can raise your risk of infections and slow your recovery, so you will need to focus on controlling your diabetes during the weeks before surgery. . Make sure that the doctor who takes care of your diabetes knows about your planned surgery including the date and location.  How do I manage my blood sugar before surgery? . Check your blood sugar at least 4 times a day, starting 2 days before surgery, to make sure that the level is not too high or low. o Check your blood sugar the morning of  your surgery when you wake up and every 2 hours until you get to the Short Stay unit. . If your blood sugar is less than 70 mg/dL, you will need to treat for low blood sugar: o Do not take insulin. o Treat a low blood sugar (less than 70 mg/dL) with  cup of clear juice (cranberry or apple), 4 glucose tablets, OR glucose gel. o Recheck blood sugar in 15 minutes after treatment (to make sure it is greater than 70 mg/dL). If your blood sugar is not greater than 70 mg/dL on recheck, call [phone] for further instructions. . Report your blood sugar to the short stay nurse when you get to Short Stay.  . If you are admitted to the hospital after surgery: o Your blood sugar will be checked by the staff and you will probably be given insulin after surgery (instead of oral diabetes medicines) to make sure you have good blood sugar levels. o The goal for blood sugar control after surgery is 80-180 mg/dL.     WHAT DO I DO ABOUT MY DIABETES MEDICATION?   Marland Kitchen. Do not take oral diabetes medicines (pills) the morning of surgery.  Reviewed and Endorsed by [facility] Patient Education Committee, August 2015   Do not wear jewelry, make-up or nail polish.  Do not wear lotions, powders, or perfumes.    Do not shave 48 hours prior to surgery.    Do not bring valuables to the hospital.  [facility] is not responsible for  any belongings or valuables.  Contacts, dentures or bridgework may not be worn into surgery.  Leave your suitcase in the car.  After surgery it may be brought to your room.  For patients admitted to the hospital, discharge time will be determined by your treatment team.  Patients discharged the day of surgery will not be allowed to drive home.   Name and phone number of your driver:    Special instructions:  Shower using CHG soap the night before and the morning of your surgery  Please read over the following fact sheets that you were given. Blood Transfusion Information and MRSA  Information

## 2016-06-19 ENCOUNTER — Encounter (HOSPITAL_COMMUNITY)
Admission: RE | Admit: 2016-06-19 | Discharge: 2016-06-19 | Disposition: A | Discharge status: Home / Self Care | Payer: BC Managed Care – PPO | Source: Ambulatory Visit | Attending: Neurosurgery | Admitting: Neurosurgery

## 2016-06-19 ENCOUNTER — Encounter (HOSPITAL_COMMUNITY): Payer: Self-pay

## 2016-06-19 DIAGNOSIS — M4316 Spondylolisthesis, lumbar region: Secondary | ICD-10-CM | POA: Diagnosis not present

## 2016-06-19 DIAGNOSIS — Z01812 Encounter for preprocedural laboratory examination: Secondary | ICD-10-CM | POA: Insufficient documentation

## 2016-06-19 DIAGNOSIS — Z01818 Encounter for other preprocedural examination: Secondary | ICD-10-CM | POA: Diagnosis not present

## 2016-06-19 DIAGNOSIS — Z0183 Encounter for blood typing: Secondary | ICD-10-CM | POA: Diagnosis not present

## 2016-06-19 DIAGNOSIS — E119 Type 2 diabetes mellitus without complications: Secondary | ICD-10-CM | POA: Diagnosis not present

## 2016-06-19 DIAGNOSIS — Z9641 Presence of insulin pump (external) (internal): Secondary | ICD-10-CM | POA: Diagnosis not present

## 2016-06-19 DIAGNOSIS — Z79899 Other long term (current) drug therapy: Secondary | ICD-10-CM | POA: Diagnosis not present

## 2016-06-19 DIAGNOSIS — K219 Gastro-esophageal reflux disease without esophagitis: Secondary | ICD-10-CM | POA: Diagnosis not present

## 2016-06-19 DIAGNOSIS — Z7982 Long term (current) use of aspirin: Secondary | ICD-10-CM | POA: Insufficient documentation

## 2016-06-19 DIAGNOSIS — I358 Other nonrheumatic aortic valve disorders: Secondary | ICD-10-CM | POA: Diagnosis not present

## 2016-06-19 DIAGNOSIS — I10 Essential (primary) hypertension: Secondary | ICD-10-CM | POA: Diagnosis not present

## 2016-06-19 HISTORY — DX: Gastro-esophageal reflux disease without esophagitis: K21.9

## 2016-06-19 HISTORY — DX: Unspecified osteoarthritis, unspecified site: M19.90

## 2016-06-19 HISTORY — DX: Essential (primary) hypertension: I10

## 2016-06-19 HISTORY — DX: Type 2 diabetes mellitus without complications: E11.9

## 2016-06-19 HISTORY — DX: Cardiac murmur, unspecified: R01.1

## 2016-06-19 HISTORY — DX: Reserved for inherently not codable concepts without codable children: IMO0001

## 2016-06-19 HISTORY — DX: Family history of other specified conditions: Z84.89

## 2016-06-19 HISTORY — DX: Bronchitis, not specified as acute or chronic: J40

## 2016-06-19 HISTORY — DX: Pneumonia, unspecified organism: J18.9

## 2016-06-19 HISTORY — DX: Nonspecific reaction to tuberculin skin test without active tuberculosis: R76.11

## 2016-06-19 LAB — CBC
HEMATOCRIT: 46.1 % — AB (ref 36.0–46.0)
Hemoglobin: 14.8 g/dL (ref 12.0–15.0)
MCH: 29.6 pg (ref 26.0–34.0)
MCHC: 32.1 g/dL (ref 30.0–36.0)
MCV: 92.2 fL (ref 78.0–100.0)
PLATELETS: 335 10*3/uL (ref 150–400)
RBC: 5 MIL/uL (ref 3.87–5.11)
RDW: 14.3 % (ref 11.5–15.5)
WBC: 11.4 10*3/uL — AB (ref 4.0–10.5)

## 2016-06-19 LAB — BASIC METABOLIC PANEL
ANION GAP: 6 (ref 5–15)
BUN: 24 mg/dL — ABNORMAL HIGH (ref 6–20)
CALCIUM: 10 mg/dL (ref 8.9–10.3)
CHLORIDE: 105 mmol/L (ref 101–111)
CO2: 28 mmol/L (ref 22–32)
Creatinine, Ser: 0.94 mg/dL (ref 0.44–1.00)
GFR calc Af Amer: >60 mL/min (ref >60)
GFR calc non Af Amer: >60 mL/min (ref >60)
Glucose, Bld: 109 mg/dL — ABNORMAL HIGH (ref 65–99)
POTASSIUM: 4.3 mmol/L (ref 3.5–5.1)
Sodium: 139 mmol/L (ref 135–145)

## 2016-06-19 LAB — TYPE AND SCREEN
ABO/RH(D): O POS
ANTIBODY SCREEN: NEGATIVE

## 2016-06-19 LAB — ABO/RH: ABO/RH(D): O POS

## 2016-06-19 LAB — GLUCOSE, CAPILLARY: Glucose-Capillary: 126 mg/dL — ABNORMAL HIGH (ref 65–99)

## 2016-06-19 LAB — SURGICAL PCR SCREEN
MRSA, PCR: NEGATIVE
STAPHYLOCOCCUS AUREUS: NEGATIVE

## 2016-06-19 NOTE — Progress Notes (Signed)
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 Nurse asked patient what she normally does before having a surgical procedure, and patient stated that she reduces the basal rate by 20%. Diabetes coordinator notified of this. Patient stated her blood glucose typically ranges from 40s to 160s.   Before patient left PAT appointment, patient informed Nurse that Shanda BumpsJessica from Dr. Rush FarmerSterns office called and left her a voicemail informing her of a time change. Patient did not appear to be happy about this change, and requested that Nurse return call to [place]. Nurse called Shanda BumpsJessica. No answer. Voicemail left informing Shanda BumpsJessica of patients concern and requesting that Pharmacist, communityJessica contact Nurse or patient for any further questions. Patient thanked Nurse for calling Shanda BumpsJessica, and stated she would call Shanda BumpsJessica too.

## 2016-06-20 LAB — HEMOGLOBIN A1C
Hgb A1c MFr Bld: 6.4 % — ABNORMAL HIGH (ref 4.8–5.6)
Mean Plasma Glucose: 137 mg/dL

## 2016-06-20 NOTE — Progress Notes (Signed)
Anesthesia Chart Review:  Pt is a 55 year old female scheduled for L4-5 maximum access PLIF with resection of synovial cyst on 06/27/2016 with Maeola HarmanJoseph Stern, MD.   Cardiologist is Thurmon FairMihai Croitoru, MD, last office visit 01/11/15, annual f/u scheduled for 07/31/16.   PMH includes:  HTN, DM, heart murmur (aortic sclerosis), GERD. Never smoker. BMI 36  Medications include: advair, albuterol, ASA, lasix, insulin on a pump, jardiance, levothyroxine, losartan, novolog, prilosec, crestor  Preoperative labs reviewed.  HgbA1c 6.4, glucose 109  EKG 06/19/16: NSR. Minimal voltage criteria for LVH, may be normal variant. Septal infarct, age undetermined. Appears unchanged compared with 01/11/15 tracing.   Nuclear stress test 10/03/14: Low risk stress nuclear study with ECG changes; however perfusion is normal with no ischemia or infarction. LV Wall Motion: NL LV Function; NL Wall Motion  Echo 10/03/14:  - Left ventricle: The cavity size was normal. Wall thickness was normal. Systolic function was normal. The estimated ejection fraction was in the range of 60% to 65%. Doppler parameters are consistent with elevated ventricular end-diastolic filling pressure. - Mitral valve: Calcified annulus. - Left atrium: The atrium was mildly dilated. - Atrial septum: No defect or patent foramen ovale was identified.  If no changes, I anticipate pt can proceed with surgery as scheduled.   Rica Mastngela Davarius Ridener, FNP-BC [facility]Anesthesiology Phone: [phone] 06/20/2016 3:01 PM

## 2016-06-30 ENCOUNTER — Ambulatory Visit: Payer: Self-pay | Admitting: Cardiovascular Disease

## 2016-07-07 NOTE — H&P (Signed)
Patient ID:   [id number] Patient: Wanda Hicks  Date of Birth: 1961/10/04 Visit Type: Office Visit   Date: 05/21/2016 04:00 PM Provider: Danae Orleans. Venetia Maxon MD   This 55 year old female presents for neck pain.  History of Present Illness: 1.  neck pain    The patient comes in to review her MRI. She continues to have lumbar pain. She states her worst pain is in the middle of the night.she is having pain into her right leg.  She has persistent right EHL weakness.  05/17/16 MRI: Moderate multifactorial spinal stenosis at L4-5 secondary to facet disease, a resulting grade 1 anterolisthesis and annular disc bulging. There is probable bilateral L5 nerve root encroachment and possible L4 nerve root encroachment. Small synovial cysts extending medially from the facet joints contribute to mass effect on the thecal sac inferior to the disc space level. Mild disc bulging and facet hypertrophy at L3-4 without resulting spinal stenosis or nerve root encroachment. Possible fatty mass in the right adnexa, incompletely visualized, suggesting possible dermoid. Further evaluation recommended, starting with pelvic ultrasound.      Medical/Surgical/Interim History Reviewed, no change.  Last detailed document date:11/13/2014.   PAST MEDICAL HISTORY, SURGICAL HISTORY, FAMILY HISTORY, SOCIAL HISTORY AND REVIEW OF SYSTEMS  05/14/2016, which I have signed.  Family History: Reviewed, no changes.  Last detailed document: 11/13/2014.   Social History: Tobacco use reviewed. Reviewed, no changes. Last detailed document date: 11/13/2014.      MEDICATIONS(added, continued or stopped this visit): Started Medication Directions Instruction Stopped   Advair Diskus 100 mcg-50 mcg/dose powder for inhalation inhale 1 puff by inhalation route 2 times every day in the morning and evening approximately 12 hours apart     aspirin 81 mg tablet,delayed release take 1 tablet by oral route  every day     cyclobenzaprine 10 mg  tablet take 1 tablet by oral route  every day as needed     diclofenac ER 100 mg tablet,extended release 24 hr take 1 tablet by oral route  every day    01/03/2015 gabapentin 100 mg capsule take 3 capsule by oral route 3 times every day, tapering gradually as numbness resolves     Invokana 100 mg tablet take 1 tablet by oral route  every day before the first meal of the day     Lasix 20 mg tablet take 1 tablet by oral route  every day     Lipitor 40 mg tablet take 1 tablet by oral route  every day     lisinopril 10 mg-hydrochlorothiazide 12.5 mg tablet take 1 tablet by oral route  every day     multivitamin tablet 1 tablet daily    05/14/2016 Neurontin 300 mg capsule take 1 capsule by oral route 3 times every day     Prilosec OTC 20 mg tablet,delayed release 1 tablet daily     Singulair 10 mg tablet take 1 tablet by oral route  every day in the evening     Synthroid 112 mcg tablet take 1 tablet by oral route  every day    05/14/2016 tramadol 50 mg tablet take 1 tablet by oral route  every 6 hours as needed as needed     Tylenol Extra Strength 500 mg tablet take 2 tablets by oral route  every day       ALLERGIES: Ingredient Reaction Medication Name Comment  NO KNOWN ALLERGIES     No known allergies.    Vitals Date Temp F BP  Pulse Ht In Wt Lb BMI BSA Pain Score  05/21/2016  127/75 83 64 216 37.08  4/10      IMPRESSION The patient is experiencing low back and right>left leg numbness. On review of her MRI, she has spinal stenosis, anterolisthesis, and annular disc bulge at the L4-5 level. I believe this is severe enough to warrant surgical intervention, but the patient would like to first try an L5 SNRB to alleviate her symptoms for her upcoming trip to [place]. After her trip, we will schedule her for an L4-5 MAS PLIF.  Completed Orders (this encounter) Order Details Reason Side Interpretation Result Initial Treatment Date Region  Dietary management education, guidance, and  counseling encouraged to eat a well balanced diet and follow up with primary care doctor.         Assessment/Plan # Detail Type Description   1. Assessment Body mass index (BMI) 37.0-37.9, adult (Z68.37).   Plan Orders Today's instructions / counseling include(s) Dietary management education, guidance, and counseling.         Pain Assessment/Treatment Pain Scale: 4/10. Method: Numeric Pain Intensity Scale. Location: neck. Onset: 10/13/2014. Duration: varies. Quality: discomforting. Pain Assessment/Treatment follow-up plan of care: Patient is taking medications prescribed.  Fall Risk Plan The patient has not fallen in the last year.  Schedule L5 SNRB for 6/22 and L4-5 MAS PLIF for 7/14. Nurse education given. patient was fitted for LSO brace today.  Orders: Instruction(s)/Education: Assessment Instruction  [phone] Dietary management education, guidance, and counseling             Provider:  Danae Orleans. Venetia Maxon MD  05/21/2016 05:06 PM Dictation edited by: Danae Orleans. Venetia Maxon    CC Providers: [facility] [address] [place],  [place]  [address]-   Maeola Harman MD [address] [place], [place] [address]              Electronically signed by Danae Orleans. Venetia Maxon MD on 05/21/2016 05:31 PM  Patient ID:   [id number] Patient: Wanda Hicks  Date of Birth: [birth year] Visit Type: Office Visit   Date: 05/14/2016 03:00 PM Provider: Danae Orleans. Venetia Maxon MD   This 55 year old female presents for neck pain.  History of Present Illness: 1.  neck pain    Last seen in 2016 following ACDF, Wanda Hicks returns for evaluation of right buttock and groin pain.  She also experiences some numbness in the right greater than left leg and foot.  Rheumatology exam revealed L5 issues. She is having no neck pain at this time. 8/10 leg pain in the morning, 6-7/10 during the day.   Physical Exam: 4/5 right EHL, Negative SLR and Patrick's on the right,  and hip abductor strength is normal on the right. Symmetric reflexes.  Old gabapentin prescription 300 mg 3 times a day helps the numbness Tylenol arthritis two, diclofenac one, Flexeril one taken nightly  X-ray on [place] reveals grade 1 spondylolisthesis of 10 mm at neutral at L4-5      Medical/Surgical/Interim History Reviewed, no change.  Last detailed document date:11/13/2014.   PAST MEDICAL HISTORY, SURGICAL HISTORY, FAMILY HISTORY, SOCIAL HISTORY AND REVIEW OF SYSTEMS  05/14/2016, which I have signed.  Family History: Reviewed, no changes.  Last detailed document: 11/13/2014.   Social History: Tobacco use reviewed. Reviewed, no changes. Last detailed document date: 11/13/2014.      MEDICATIONS(added, continued or stopped this visit): Started Medication Directions Instruction Stopped   Advair Diskus 100 mcg-50 mcg/dose powder for inhalation  inhale 1 puff by inhalation route 2 times every day in the morning and evening approximately 12 hours apart     aspirin 81 mg tablet,delayed release take 1 tablet by oral route  every day     cyclobenzaprine 10 mg tablet take 1 tablet by oral route  every day as needed     diclofenac ER 100 mg tablet,extended release 24 hr take 1 tablet by oral route  every day    01/03/2015 gabapentin 100 mg capsule take 3 capsule by oral route 3 times every day, tapering gradually as numbness resolves     Invokana 100 mg tablet take 1 tablet by oral route  every day before the first meal of the day     Lasix 20 mg tablet take 1 tablet by oral route  every day     Lipitor 40 mg tablet take 1 tablet by oral route  every day     lisinopril 10 mg-hydrochlorothiazide 12.5 mg tablet take 1 tablet by oral route  every day     multivitamin tablet 1 tablet daily    05/14/2016 Neurontin 300 mg capsule take 1 capsule by oral route 3 times every day     Prilosec OTC 20 mg tablet,delayed release 1 tablet daily     Singulair 10 mg tablet take 1 tablet by oral route   every day in the evening     Synthroid 112 mcg tablet take 1 tablet by oral route  every day    05/14/2016 tramadol 50 mg tablet take 1 tablet by oral route  every 6 hours as needed as needed    11/29/2014 tramadol 50 mg tablet take 1 tablet by oral route  every 6 hours as needed  05/14/2016   Tylenol Extra Strength 500 mg tablet take 2 tablets by oral route  every day       ALLERGIES: Ingredient Reaction Medication Name Comment  NO KNOWN ALLERGIES     No known allergies.    Vitals Date Temp F BP Pulse Ht In Wt Lb BMI BSA Pain Score  05/14/2016  162/81 84 64 215 36.9  5/10      IMPRESSION The patient is experiencing right buttock and groin pain with numbness in the right leg greater than left. On review of her X-ray, she has grade 1 spondylolisthesis and stenosis at the L4-5 level. On confrontational testing, she has right EHL weakness.  I will order lumbar MRI and flexion/extension X-rays for further workup.    Pain Assessment/Treatment Pain Scale: 5/10. Method: Numeric Pain Intensity Scale. Location: back. Onset: 10/13/2014. Duration: varies. Quality: discomforting. Pain Assessment/Treatment follow-up plan of care: Patient is taking medications prescribed.  Fall Risk Plan The patient has not fallen in the last year.  Refilled 300 mg gabapentin. Prescribed tramadol. Ordered lumbar MRI and flexion/extension X-rays. Follow up after to discuss.     MEDICATIONS PRESCRIBED TODAY    Rx Quantity Refills  TRAMADOL HCL 50 mg  90 0  NEURONTIN 300 mg  90 3            Provider:  Danae Orleans. Venetia Maxon MD  05/14/2016 04:32 PM Dictation edited by: Gabriel Rainwater    CC Providers: [facility] [address] [place],  [place]  [address]-   Maeola Harman MD [address] [place], [place] [address]              Electronically signed by Danae Orleans. Venetia Maxon MD on 05/14/2016 05:23 PM

## 2016-07-08 ENCOUNTER — Other Ambulatory Visit: Payer: Self-pay | Admitting: Neurosurgery

## 2016-07-08 ENCOUNTER — Encounter (HOSPITAL_COMMUNITY): Payer: Self-pay | Admitting: *Deleted

## 2016-07-08 NOTE — Progress Notes (Signed)
I spoke with patient and reminded her to bring supplies for Insulin Pump.

## 2016-07-09 MED ORDER — CEFAZOLIN SODIUM-DEXTROSE 2-4 GM/100ML-% IV SOLN
2.0000 g | INTRAVENOUS | Status: AC
  Administered 2016-07-10: 2 g via INTRAVENOUS

## 2016-07-10 ENCOUNTER — Inpatient Hospital Stay (HOSPITAL_COMMUNITY)
Admission: RE | Admit: 2016-07-10 | Discharge: 2016-07-11 | DRG: 460 | Disposition: A | Discharge status: Home / Self Care | Payer: BC Managed Care – PPO | Source: Ambulatory Visit | Attending: Neurosurgery | Admitting: Neurosurgery

## 2016-07-10 ENCOUNTER — Inpatient Hospital Stay (HOSPITAL_COMMUNITY): Payer: BC Managed Care – PPO | Admitting: Emergency Medicine

## 2016-07-10 ENCOUNTER — Inpatient Hospital Stay (HOSPITAL_COMMUNITY): Payer: BC Managed Care – PPO

## 2016-07-10 ENCOUNTER — Inpatient Hospital Stay (HOSPITAL_COMMUNITY): Payer: BC Managed Care – PPO | Admitting: Certified Registered Nurse Anesthetist

## 2016-07-10 ENCOUNTER — Encounter (HOSPITAL_COMMUNITY)
Admission: RE | Disposition: A | Discharge status: Home / Self Care | Payer: Self-pay | Source: Ambulatory Visit | Attending: Neurosurgery

## 2016-07-10 ENCOUNTER — Encounter (HOSPITAL_COMMUNITY): Payer: Self-pay | Admitting: *Deleted

## 2016-07-10 DIAGNOSIS — M7138 Other bursal cyst, other site: Secondary | ICD-10-CM | POA: Diagnosis present

## 2016-07-10 DIAGNOSIS — K219 Gastro-esophageal reflux disease without esophagitis: Secondary | ICD-10-CM | POA: Diagnosis present

## 2016-07-10 DIAGNOSIS — M5116 Intervertebral disc disorders with radiculopathy, lumbar region: Secondary | ICD-10-CM | POA: Diagnosis present

## 2016-07-10 DIAGNOSIS — M4806 Spinal stenosis, lumbar region: Principal | ICD-10-CM | POA: Diagnosis present

## 2016-07-10 DIAGNOSIS — I10 Essential (primary) hypertension: Secondary | ICD-10-CM | POA: Diagnosis present

## 2016-07-10 DIAGNOSIS — Z419 Encounter for procedure for purposes other than remedying health state, unspecified: Secondary | ICD-10-CM

## 2016-07-10 DIAGNOSIS — Z7982 Long term (current) use of aspirin: Secondary | ICD-10-CM

## 2016-07-10 DIAGNOSIS — E109 Type 1 diabetes mellitus without complications: Secondary | ICD-10-CM | POA: Diagnosis present

## 2016-07-10 DIAGNOSIS — M545 Low back pain: Secondary | ICD-10-CM | POA: Diagnosis present

## 2016-07-10 DIAGNOSIS — Z794 Long term (current) use of insulin: Secondary | ICD-10-CM | POA: Diagnosis not present

## 2016-07-10 DIAGNOSIS — M4316 Spondylolisthesis, lumbar region: Secondary | ICD-10-CM | POA: Diagnosis present

## 2016-07-10 HISTORY — PX: MAXIMUM ACCESS (MAS)POSTERIOR LUMBAR INTERBODY FUSION (PLIF) 1 LEVEL: SHX6368

## 2016-07-10 LAB — GLUCOSE, CAPILLARY
GLUCOSE-CAPILLARY: 114 mg/dL — AB (ref 65–99)
GLUCOSE-CAPILLARY: 72 mg/dL (ref 65–99)
GLUCOSE-CAPILLARY: 232 mg/dL — AB (ref 65–99)
Glucose-Capillary: 79 mg/dL (ref 65–99)
Glucose-Capillary: 140 mg/dL — ABNORMAL HIGH (ref 65–99)
Glucose-Capillary: 87 mg/dL (ref 65–99)

## 2016-07-10 LAB — BASIC METABOLIC PANEL
Anion gap: 8 (ref 5–15)
BUN: 19 mg/dL (ref 6–20)
CALCIUM: 9.2 mg/dL (ref 8.9–10.3)
CO2: 25 mmol/L (ref 22–32)
CREATININE: 0.79 mg/dL (ref 0.44–1.00)
Chloride: 106 mmol/L (ref 101–111)
Glucose, Bld: 117 mg/dL — ABNORMAL HIGH (ref 65–99)
Potassium: 3.8 mmol/L (ref 3.5–5.1)
SODIUM: 139 mmol/L (ref 135–145)

## 2016-07-10 LAB — TYPE AND SCREEN
ABO/RH(D): O POS
ANTIBODY SCREEN: NEGATIVE

## 2016-07-10 LAB — CBC
HCT: 40.4 % (ref 36.0–46.0)
Hemoglobin: 12.9 g/dL (ref 12.0–15.0)
MCH: 29.2 pg (ref 26.0–34.0)
MCHC: 31.9 g/dL (ref 30.0–36.0)
MCV: 91.4 fL (ref 78.0–100.0)
PLATELETS: 247 10*3/uL (ref 150–400)
RBC: 4.42 MIL/uL (ref 3.87–5.11)
RDW: 14.6 % (ref 11.5–15.5)
WBC: 7.7 10*3/uL (ref 4.0–10.5)

## 2016-07-10 SURGERY — FOR MAXIMUM ACCESS (MAS) POSTERIOR LUMBAR INTERBODY FUSION (PLIF) 1 LEVEL
Anesthesia: General | Site: Back

## 2016-07-10 MED ORDER — LACTATED RINGERS IV SOLN
INTRAVENOUS | Status: DC | PRN
  Administered 2016-07-10 (×2): via INTRAVENOUS

## 2016-07-10 MED ORDER — INSULIN PUMP
SUBCUTANEOUS | Status: DC
  Administered 2016-07-10: 1.5 via SUBCUTANEOUS
  Filled 2016-07-10: qty 1

## 2016-07-10 MED ORDER — METHOCARBAMOL 500 MG PO TABS
ORAL_TABLET | ORAL | Status: AC
  Filled 2016-07-10: qty 1

## 2016-07-10 MED ORDER — ALBUTEROL SULFATE (2.5 MG/3ML) 0.083% IN NEBU: 2.5000 mg | INHALATION_SOLUTION | Freq: Four times a day (QID) | RESPIRATORY_TRACT | Status: DC | PRN

## 2016-07-10 MED ORDER — LOSARTAN POTASSIUM 50 MG PO TABS
50.0000 mg | ORAL_TABLET | Freq: Every day | ORAL | Status: DC
  Administered 2016-07-10: 50 mg via ORAL
  Filled 2016-07-10: qty 1

## 2016-07-10 MED ORDER — SODIUM CHLORIDE 0.9% FLUSH: 3.0000 mL | Freq: Two times a day (BID) | INTRAVENOUS | Status: DC

## 2016-07-10 MED ORDER — ROSUVASTATIN CALCIUM 20 MG PO TABS
20.0000 mg | ORAL_TABLET | Freq: Every day | ORAL | Status: DC
  Administered 2016-07-10: 20 mg via ORAL
  Filled 2016-07-10: qty 1

## 2016-07-10 MED ORDER — THROMBIN 20000 UNITS EX SOLR
CUTANEOUS | Status: DC | PRN
  Administered 2016-07-10: 09:00:00 via TOPICAL

## 2016-07-10 MED ORDER — ALUM & MAG HYDROXIDE-SIMETH 200-200-20 MG/5ML PO SUSP: 30.0000 mL | Freq: Four times a day (QID) | ORAL | Status: DC | PRN

## 2016-07-10 MED ORDER — CANAGLIFLOZIN 100 MG PO TABS
100.0000 mg | ORAL_TABLET | Freq: Every day | ORAL | Status: DC
  Administered 2016-07-11: 100 mg via ORAL
  Filled 2016-07-10: qty 1

## 2016-07-10 MED ORDER — HYDROMORPHONE HCL 1 MG/ML IJ SOLN
INTRAMUSCULAR | Status: AC
  Filled 2016-07-10: qty 1

## 2016-07-10 MED ORDER — CEFAZOLIN SODIUM-DEXTROSE 2-4 GM/100ML-% IV SOLN
2.0000 g | Freq: Three times a day (TID) | INTRAVENOUS | Status: AC
  Administered 2016-07-10 – 2016-07-11 (×2): 2 g via INTRAVENOUS
  Filled 2016-07-10 (×2): qty 100

## 2016-07-10 MED ORDER — LEVOTHYROXINE SODIUM 112 MCG PO TABS
112.0000 ug | ORAL_TABLET | Freq: Every day | ORAL | Status: DC
  Administered 2016-07-11 (×2): 112 ug via ORAL
  Filled 2016-07-10: qty 1

## 2016-07-10 MED ORDER — MENTHOL 3 MG MT LOZG: 1.0000 | LOZENGE | OROMUCOSAL | Status: DC | PRN

## 2016-07-10 MED ORDER — MIDAZOLAM HCL 5 MG/5ML IJ SOLN
INTRAMUSCULAR | Status: DC | PRN
  Administered 2016-07-10: 2 mg via INTRAVENOUS

## 2016-07-10 MED ORDER — LACTATED RINGERS IV SOLN: INTRAVENOUS | Status: DC

## 2016-07-10 MED ORDER — BISACODYL 10 MG RE SUPP: 10.0000 mg | Freq: Every day | RECTAL | Status: DC | PRN

## 2016-07-10 MED ORDER — PROPOFOL 10 MG/ML IV BOLUS
INTRAVENOUS | Status: DC | PRN
  Administered 2016-07-10: 150 mg via INTRAVENOUS

## 2016-07-10 MED ORDER — MIDAZOLAM HCL 2 MG/2ML IJ SOLN
INTRAMUSCULAR | Status: AC
  Filled 2016-07-10: qty 2

## 2016-07-10 MED ORDER — FENTANYL CITRATE (PF) 250 MCG/5ML IJ SOLN
INTRAMUSCULAR | Status: AC
  Filled 2016-07-10: qty 5

## 2016-07-10 MED ORDER — SUCCINYLCHOLINE CHLORIDE 20 MG/ML IJ SOLN
INTRAMUSCULAR | Status: DC | PRN
  Administered 2016-07-10: 100 mg via INTRAVENOUS

## 2016-07-10 MED ORDER — CEFAZOLIN SODIUM-DEXTROSE 2-4 GM/100ML-% IV SOLN
INTRAVENOUS | Status: AC
  Filled 2016-07-10: qty 100

## 2016-07-10 MED ORDER — FAMOTIDINE IN NACL 20-0.9 MG/50ML-% IV SOLN
20.0000 mg | Freq: Two times a day (BID) | INTRAVENOUS | Status: DC
  Administered 2016-07-10: 20 mg via INTRAVENOUS
  Filled 2016-07-10 (×4): qty 50

## 2016-07-10 MED ORDER — BUPIVACAINE LIPOSOME 1.3 % IJ SUSP
INTRAMUSCULAR | Status: DC | PRN
  Administered 2016-07-10: 20 mL

## 2016-07-10 MED ORDER — DOCUSATE SODIUM 100 MG PO CAPS
100.0000 mg | ORAL_CAPSULE | Freq: Two times a day (BID) | ORAL | Status: DC
  Administered 2016-07-10 (×2): 100 mg via ORAL
  Filled 2016-07-10 (×2): qty 1

## 2016-07-10 MED ORDER — HYDROCODONE-ACETAMINOPHEN 5-325 MG PO TABS: 1.0000 | ORAL_TABLET | ORAL | Status: DC | PRN

## 2016-07-10 MED ORDER — ACETAMINOPHEN 650 MG RE SUPP: 650.0000 mg | RECTAL | Status: DC | PRN

## 2016-07-10 MED ORDER — INSULIN ASPART 100 UNIT/ML ~~LOC~~ SOLN: 0.0000 [IU] | SUBCUTANEOUS | Status: DC

## 2016-07-10 MED ORDER — BUPIVACAINE LIPOSOME 1.3 % IJ SUSP
20.0000 mL | INTRAMUSCULAR | Status: DC
  Filled 2016-07-10: qty 20

## 2016-07-10 MED ORDER — PROPOFOL 1000 MG/100ML IV EMUL
INTRAVENOUS | Status: AC
  Filled 2016-07-10: qty 100

## 2016-07-10 MED ORDER — MEPERIDINE HCL 25 MG/ML IJ SOLN
INTRAMUSCULAR | Status: AC
  Filled 2016-07-10: qty 1

## 2016-07-10 MED ORDER — ASPIRIN EC 81 MG PO TBEC
81.0000 mg | DELAYED_RELEASE_TABLET | Freq: Every day | ORAL | Status: DC
Start: 2016-07-10 — End: 2016-07-11
  Administered 2016-07-10: 81 mg via ORAL
  Filled 2016-07-10: qty 1

## 2016-07-10 MED ORDER — POLYETHYLENE GLYCOL 3350 17 G PO PACK: 17.0000 g | PACK | Freq: Every day | ORAL | Status: DC | PRN

## 2016-07-10 MED ORDER — CHLORHEXIDINE GLUCONATE CLOTH 2 % EX PADS: 6.0000 | MEDICATED_PAD | Freq: Once | CUTANEOUS | Status: DC

## 2016-07-10 MED ORDER — SODIUM CHLORIDE 0.9 % IV SOLN
INTRAVENOUS | Status: DC | PRN
  Administered 2016-07-10: 1.1 [IU]/h via INTRAVENOUS

## 2016-07-10 MED ORDER — KCL IN DEXTROSE-NACL 20-5-0.45 MEQ/L-%-% IV SOLN: INTRAVENOUS | Status: DC

## 2016-07-10 MED ORDER — LIDOCAINE HCL (CARDIAC) 20 MG/ML IV SOLN
INTRAVENOUS | Status: DC | PRN
  Administered 2016-07-10: 60 mg via INTRAVENOUS

## 2016-07-10 MED ORDER — GABAPENTIN 300 MG PO CAPS
300.0000 mg | ORAL_CAPSULE | Freq: Three times a day (TID) | ORAL | Status: DC
  Administered 2016-07-10 – 2016-07-11 (×3): 300 mg via ORAL
  Filled 2016-07-10 (×3): qty 1

## 2016-07-10 MED ORDER — BUPIVACAINE HCL (PF) 0.5 % IJ SOLN
INTRAMUSCULAR | Status: DC | PRN
  Administered 2016-07-10: 5 mL

## 2016-07-10 MED ORDER — MOMETASONE FURO-FORMOTEROL FUM 100-5 MCG/ACT IN AERO
2.0000 | INHALATION_SPRAY | Freq: Two times a day (BID) | RESPIRATORY_TRACT | Status: DC
  Filled 2016-07-10: qty 8.8

## 2016-07-10 MED ORDER — PHENOL 1.4 % MT LIQD
1.0000 | OROMUCOSAL | Status: DC | PRN
Start: 2016-07-10 — End: 2016-07-11

## 2016-07-10 MED ORDER — FLEET ENEMA 7-19 GM/118ML RE ENEM: 1.0000 | ENEMA | Freq: Once | RECTAL | Status: DC | PRN

## 2016-07-10 MED ORDER — FUROSEMIDE 20 MG PO TABS
20.0000 mg | ORAL_TABLET | Freq: Every day | ORAL | Status: DC
  Administered 2016-07-10: 20 mg via ORAL
  Filled 2016-07-10: qty 1

## 2016-07-10 MED ORDER — FENTANYL CITRATE (PF) 100 MCG/2ML IJ SOLN
INTRAMUSCULAR | Status: DC | PRN
  Administered 2016-07-10 (×6): 50 ug via INTRAVENOUS

## 2016-07-10 MED ORDER — METHOCARBAMOL 1000 MG/10ML IJ SOLN
500.0000 mg | Freq: Four times a day (QID) | INTRAVENOUS | Status: DC | PRN
  Filled 2016-07-10: qty 5

## 2016-07-10 MED ORDER — MEPERIDINE HCL 25 MG/ML IJ SOLN
6.2500 mg | INTRAMUSCULAR | Status: DC | PRN
  Administered 2016-07-10: 6.25 mg via INTRAVENOUS

## 2016-07-10 MED ORDER — ACETAMINOPHEN 500 MG PO TABS
1000.0000 mg | ORAL_TABLET | Freq: Two times a day (BID) | ORAL | Status: DC
  Administered 2016-07-10: 500 mg via ORAL
  Administered 2016-07-10 (×2): 1000 mg via ORAL
  Filled 2016-07-10 (×2): qty 2

## 2016-07-10 MED ORDER — ZOLPIDEM TARTRATE 5 MG PO TABS: 5.0000 mg | ORAL_TABLET | Freq: Every evening | ORAL | Status: DC | PRN

## 2016-07-10 MED ORDER — PANTOPRAZOLE SODIUM 40 MG PO TBEC
40.0000 mg | DELAYED_RELEASE_TABLET | Freq: Every day | ORAL | Status: DC
  Administered 2016-07-10: 40 mg via ORAL
  Filled 2016-07-10: qty 1

## 2016-07-10 MED ORDER — PROPOFOL 10 MG/ML IV BOLUS
INTRAVENOUS | Status: AC
  Filled 2016-07-10: qty 20

## 2016-07-10 MED ORDER — 0.9 % SODIUM CHLORIDE (POUR BTL) OPTIME
TOPICAL | Status: DC | PRN
  Administered 2016-07-10: 1000 mL

## 2016-07-10 MED ORDER — OMEPRAZOLE MAGNESIUM 20 MG PO TBEC: 20.0000 mg | DELAYED_RELEASE_TABLET | Freq: Every day | ORAL | Status: DC

## 2016-07-10 MED ORDER — HYDROMORPHONE HCL 1 MG/ML IJ SOLN
0.2500 mg | INTRAMUSCULAR | Status: DC | PRN
  Administered 2016-07-10 (×4): 0.5 mg via INTRAVENOUS

## 2016-07-10 MED ORDER — LIDOCAINE-EPINEPHRINE 1 %-1:100000 IJ SOLN
INTRAMUSCULAR | Status: DC | PRN
  Administered 2016-07-10: 5 mL

## 2016-07-10 MED ORDER — CALCIUM CARB-CHOLECALCIFEROL 600-800 MG-UNIT PO TABS: 1.0000 | ORAL_TABLET | Freq: Every day | ORAL | Status: DC

## 2016-07-10 MED ORDER — SODIUM CHLORIDE 0.9 % IV SOLN: 250.0000 mL | INTRAVENOUS | Status: DC

## 2016-07-10 MED ORDER — PROPOFOL 500 MG/50ML IV EMUL
INTRAVENOUS | Status: DC | PRN
  Administered 2016-07-10: 50 ug/kg/min via INTRAVENOUS

## 2016-07-10 MED ORDER — OXYCODONE-ACETAMINOPHEN 5-325 MG PO TABS: 1.0000 | ORAL_TABLET | ORAL | Status: DC | PRN

## 2016-07-10 MED ORDER — SODIUM CHLORIDE 0.9 % IV SOLN
INTRAVENOUS | Status: DC
  Filled 2016-07-10: qty 2.5

## 2016-07-10 MED ORDER — ACETAMINOPHEN 325 MG PO TABS: 650.0000 mg | ORAL_TABLET | ORAL | Status: DC | PRN

## 2016-07-10 MED ORDER — ONDANSETRON HCL 4 MG/2ML IJ SOLN: 4.0000 mg | INTRAMUSCULAR | Status: DC | PRN

## 2016-07-10 MED ORDER — DICLOFENAC SODIUM 50 MG PO TBEC
100.0000 mg | DELAYED_RELEASE_TABLET | Freq: Every day | ORAL | Status: DC
  Administered 2016-07-10: 100 mg via ORAL
  Filled 2016-07-10 (×2): qty 2

## 2016-07-10 MED ORDER — PHENYLEPHRINE HCL 10 MG/ML IJ SOLN
INTRAVENOUS | Status: DC | PRN
  Administered 2016-07-10: 25 ug/min via INTRAVENOUS

## 2016-07-10 MED ORDER — CYCLOBENZAPRINE HCL 10 MG PO TABS
10.0000 mg | ORAL_TABLET | Freq: Three times a day (TID) | ORAL | Status: DC | PRN
  Administered 2016-07-10: 10 mg via ORAL
  Filled 2016-07-10: qty 1

## 2016-07-10 MED ORDER — ONDANSETRON HCL 4 MG/2ML IJ SOLN
INTRAMUSCULAR | Status: DC | PRN
  Administered 2016-07-10: 4 mg via INTRAVENOUS

## 2016-07-10 MED ORDER — ADULT MULTIVITAMIN W/MINERALS CH
1.0000 | ORAL_TABLET | Freq: Every day | ORAL | Status: DC
  Administered 2016-07-10: 1 via ORAL
  Filled 2016-07-10: qty 1

## 2016-07-10 MED ORDER — HYDROMORPHONE HCL 1 MG/ML IJ SOLN
INTRAMUSCULAR | Status: AC
  Administered 2016-07-10: 0.5 mg via INTRAVENOUS
  Filled 2016-07-10: qty 1

## 2016-07-10 MED ORDER — TRAMADOL HCL 50 MG PO TABS
50.0000 mg | ORAL_TABLET | Freq: Four times a day (QID) | ORAL | Status: DC | PRN
  Administered 2016-07-10 – 2016-07-11 (×4): 50 mg via ORAL
  Filled 2016-07-10 (×4): qty 1

## 2016-07-10 MED ORDER — ONE-DAILY MULTI VITAMINS PO TABS: 1.0000 | ORAL_TABLET | Freq: Every day | ORAL | Status: DC

## 2016-07-10 MED ORDER — FLUTICASONE PROPIONATE 50 MCG/ACT NA SUSP
1.0000 | Freq: Every day | NASAL | Status: DC | PRN
  Filled 2016-07-10: qty 16

## 2016-07-10 MED ORDER — KRILL OIL 1000 MG PO CAPS: ORAL_CAPSULE | Freq: Every day | ORAL | Status: DC

## 2016-07-10 MED ORDER — CYCLOBENZAPRINE HCL 10 MG PO TABS: 10.0000 mg | ORAL_TABLET | Freq: Three times a day (TID) | ORAL | Status: DC | PRN

## 2016-07-10 MED ORDER — INSULIN PUMP: Freq: Three times a day (TID) | SUBCUTANEOUS | Status: DC

## 2016-07-10 MED ORDER — HYDROMORPHONE HCL 1 MG/ML IJ SOLN
0.5000 mg | INTRAMUSCULAR | Status: DC | PRN
  Administered 2016-07-10: 1 mg via INTRAVENOUS

## 2016-07-10 MED ORDER — PROMETHAZINE HCL 25 MG/ML IJ SOLN: 6.2500 mg | INTRAMUSCULAR | Status: DC | PRN

## 2016-07-10 MED ORDER — METHOCARBAMOL 500 MG PO TABS
500.0000 mg | ORAL_TABLET | Freq: Four times a day (QID) | ORAL | Status: DC | PRN
  Administered 2016-07-10: 500 mg via ORAL
  Filled 2016-07-10 (×2): qty 1

## 2016-07-10 MED ORDER — CALCIUM CARBONATE-VITAMIN D 500-200 MG-UNIT PO TABS: 1.0000 | ORAL_TABLET | Freq: Every day | ORAL | Status: DC

## 2016-07-10 MED ORDER — SODIUM CHLORIDE 0.9% FLUSH: 3.0000 mL | INTRAVENOUS | Status: DC | PRN

## 2016-07-10 SURGICAL SUPPLY — 88 items
BENZOIN TINCTURE PRP APPL 2/3 (GAUZE/BANDAGES/DRESSINGS) IMPLANT
BIT DRILL PLIF MAS DISP 5.5MM (DRILL) ×1 IMPLANT
BLADE CLIPPER SURG (BLADE) IMPLANT
BONE MATRIX OSTEOCEL PRO MED (Bone Implant) ×2 IMPLANT
BUR MATCHSTICK NEURO 3.0 LAGG (BURR) ×2 IMPLANT
BUR ROUND FLUTED 5 RND (BURR) ×2 IMPLANT
CAGE COROENT 8X9X23 MP LRG (Spacer) ×4 IMPLANT
CANISTER SUCT 3000ML PPV (MISCELLANEOUS) ×2 IMPLANT
CAP RELINE MOD TULIP RMM (Cap) ×4 IMPLANT
CLIP NEUROVISION LG (CLIP) ×2 IMPLANT
CONT SPEC 4OZ CLIKSEAL STRL BL (MISCELLANEOUS) ×2 IMPLANT
CORDS BIPOLAR (ELECTRODE) IMPLANT
COVER BACK TABLE 24X17X13 BIG (DRAPES) IMPLANT
COVER BACK TABLE 60X90IN (DRAPES) ×2 IMPLANT
DECANTER SPIKE VIAL GLASS SM (MISCELLANEOUS) ×2 IMPLANT
DERMABOND ADVANCED (GAUZE/BANDAGES/DRESSINGS) ×1
DERMABOND ADVANCED .7 DNX12 (GAUZE/BANDAGES/DRESSINGS) ×1 IMPLANT
DRAPE C-ARM 42X72 X-RAY (DRAPES) ×2 IMPLANT
DRAPE C-ARMOR (DRAPES) ×2 IMPLANT
DRAPE LAPAROTOMY 100X72X124 (DRAPES) ×2 IMPLANT
DRAPE POUCH INSTRU U-SHP 10X18 (DRAPES) ×2 IMPLANT
DRAPE SURG 17X23 STRL (DRAPES) ×2 IMPLANT
DRILL PLIF MAS DISP 5.5MM (DRILL) ×2
DRSG OPSITE POSTOP 4X6 (GAUZE/BANDAGES/DRESSINGS) ×4 IMPLANT
DURAPREP 26ML APPLICATOR (WOUND CARE) ×2 IMPLANT
ELECT BLADE 4.0 EZ CLEAN MEGAD (MISCELLANEOUS) ×2
ELECT CAUTERY BLADE 6.4 (BLADE) ×2 IMPLANT
ELECT REM PT RETURN 9FT ADLT (ELECTROSURGICAL) ×2
ELECTRODE BLDE 4.0 EZ CLN MEGD (MISCELLANEOUS) ×1 IMPLANT
ELECTRODE REM PT RTRN 9FT ADLT (ELECTROSURGICAL) ×1 IMPLANT
EVACUATOR 1/8 PVC DRAIN (DRAIN) IMPLANT
GAUZE SPONGE 4X4 12PLY STRL (GAUZE/BANDAGES/DRESSINGS) IMPLANT
GAUZE SPONGE 4X4 16PLY XRAY LF (GAUZE/BANDAGES/DRESSINGS) IMPLANT
GLOVE BIO SURGEON STRL SZ8 (GLOVE) IMPLANT
GLOVE BIOGEL PI IND STRL 7.0 (GLOVE) ×2 IMPLANT
GLOVE BIOGEL PI IND STRL 7.5 (GLOVE) ×1 IMPLANT
GLOVE BIOGEL PI IND STRL 8 (GLOVE) ×1 IMPLANT
GLOVE BIOGEL PI IND STRL 8.5 (GLOVE) ×1 IMPLANT
GLOVE BIOGEL PI INDICATOR 7.0 (GLOVE) ×2
GLOVE BIOGEL PI INDICATOR 7.5 (GLOVE) ×1
GLOVE BIOGEL PI INDICATOR 8 (GLOVE) ×1
GLOVE BIOGEL PI INDICATOR 8.5 (GLOVE) ×1
GLOVE ECLIPSE 8.0 STRL XLNG CF (GLOVE) IMPLANT
GLOVE EXAM NITRILE LRG STRL (GLOVE) IMPLANT
GLOVE EXAM NITRILE MD LF STRL (GLOVE) IMPLANT
GLOVE EXAM NITRILE XL STR (GLOVE) IMPLANT
GLOVE EXAM NITRILE XS STR PU (GLOVE) IMPLANT
GLOVE SURG SS PI 6.5 STRL IVOR (GLOVE) ×10 IMPLANT
GLOVE SURG SS PI 8.0 STRL IVOR (GLOVE) ×12 IMPLANT
GOWN STRL REUS W/ TWL LRG LVL3 (GOWN DISPOSABLE) ×1 IMPLANT
GOWN STRL REUS W/ TWL XL LVL3 (GOWN DISPOSABLE) ×3 IMPLANT
GOWN STRL REUS W/TWL 2XL LVL3 (GOWN DISPOSABLE) ×4 IMPLANT
GOWN STRL REUS W/TWL LRG LVL3 (GOWN DISPOSABLE) ×1
GOWN STRL REUS W/TWL XL LVL3 (GOWN DISPOSABLE) ×3
KIT BASIN OR (CUSTOM PROCEDURE TRAY) ×2 IMPLANT
KIT POSITION SURG JACKSON T1 (MISCELLANEOUS) ×2 IMPLANT
KIT ROOM TURNOVER OR (KITS) ×2 IMPLANT
MILL MEDIUM DISP (BLADE) ×2 IMPLANT
MODULE NVM5 NEXT GEN EMG (NEEDLE) ×2 IMPLANT
NEEDLE HYPO 21X1.5 SAFETY (NEEDLE) ×2 IMPLANT
NEEDLE HYPO 25X1 1.5 SAFETY (NEEDLE) ×2 IMPLANT
NEEDLE SPNL 18GX3.5 QUINCKE PK (NEEDLE) ×2 IMPLANT
NS IRRIG 1000ML POUR BTL (IV SOLUTION) ×2 IMPLANT
PACK LAMINECTOMY NEURO (CUSTOM PROCEDURE TRAY) ×2 IMPLANT
PAD ARMBOARD 7.5X6 YLW CONV (MISCELLANEOUS) ×4 IMPLANT
PATTIES SURGICAL .5 X.5 (GAUZE/BANDAGES/DRESSINGS) IMPLANT
PATTIES SURGICAL .5 X1 (DISPOSABLE) IMPLANT
PATTIES SURGICAL 1X1 (DISPOSABLE) IMPLANT
PENCIL BUTTON HOLSTER BLD 10FT (ELECTRODE) IMPLANT
ROD RELINE COCR LORD 5.0X35 (Rod) ×2 IMPLANT
ROD RELINE COCR LORD 5X30 (Rod) ×2 IMPLANT
SCREW LOCK RSS 4.5/5.0MM (Screw) ×8 IMPLANT
SCREW RELINE RMM 5.5X35 4S (Screw) ×4 IMPLANT
SCREW SHANK RELINE MOD 5.5X35 (Screw) ×4 IMPLANT
SPONGE LAP 4X18 X RAY DECT (DISPOSABLE) IMPLANT
SPONGE SURGIFOAM ABS GEL 100 (HEMOSTASIS) ×2 IMPLANT
STAPLER SKIN PROX WIDE 3.9 (STAPLE) IMPLANT
STRIP CLOSURE SKIN 1/2X4 (GAUZE/BANDAGES/DRESSINGS) IMPLANT
SUT VIC AB 1 CT1 18XBRD ANBCTR (SUTURE) ×1 IMPLANT
SUT VIC AB 1 CT1 8-18 (SUTURE) ×1
SUT VIC AB 2-0 CT1 18 (SUTURE) ×2 IMPLANT
SUT VIC AB 3-0 SH 8-18 (SUTURE) ×4 IMPLANT
SYR 5ML LL (SYRINGE) IMPLANT
TOWEL OR 17X24 6PK STRL BLUE (TOWEL DISPOSABLE) ×2 IMPLANT
TOWEL OR 17X26 10 PK STRL BLUE (TOWEL DISPOSABLE) ×2 IMPLANT
TRAP SPECIMEN MUCOUS 40CC (MISCELLANEOUS) ×2 IMPLANT
TRAY FOLEY W/METER SILVER 16FR (SET/KITS/TRAYS/PACK) IMPLANT
WATER STERILE IRR 1000ML POUR (IV SOLUTION) ×2 IMPLANT

## 2016-07-10 NOTE — Progress Notes (Signed)
Awake, alert, conversant.  MAEW with full bilateral lower extremity strength.  Doing well.

## 2016-07-10 NOTE — Op Note (Signed)
07/10/2016  10:37 AM  PATIENT:  Wanda Hicks  55 y.o. 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  SURGEON:  Surgeon(s) and Role:    * Maeola Harman, MD - Primary  PHYSICIAN ASSISTANT: Yetta Barre, MD  ASSISTANTS: Poteat, RN    Williams, PAS  ANESTHESIA:   local and general  EBL:  Total I/O In: 1000 [I.V.:1000] Out: 450 [Urine:200; Blood:250]  BLOOD ADMINISTERED:none  DRAINS: none   LOCAL MEDICATIONS USED:  MARCAINE    and LIDOCAINE   SPECIMEN:  No Specimen  DISPOSITION OF SPECIMEN:  N/A  COUNTS:  YES  TOURNIQUET:  * No tourniquets in log *  DICTATION: DICTATION: Patient is a 55 year old with spondylosis , stenosis, spondylolisthesis, disc herniation and severe back and bilateral lower extremity pain at L4/5 levels of the lumbar spine. It was elected to take her to surgery for MASPLIF L 45 level with posterolateral arthrodesis along with resection of synovial cysts.  Procedure:   Following uncomplicated induction of GETA, and placement of electrodes for neural monitoring, patient was turned into a prone position on the [place] tableand using AP  fluoroscopy the area of planned incision was marked, prepped with betadine scrub and Duraprep, then draped. Exposure was performed of facet joint complex at L 45 level and the MAS retractor was placed.5.5 x 35 mm cortical Nuvasive screws were placed at L 4 bilaterally according to standard landmarks using neural monitoring.  A total laminectomy of L 4 was then performed with  disarticulation of facets.  This bone was saved for grafting, combined with Osteocel after being run through bone mill and was placed in bone packing device.  Thorough discectomy was performed bilaterally at L 45 and the endplates were prepared for grafting.  23 x 8 x 15 degree cages were placed in the interspace and positioning was confirmed with AP and lateral fluoroscopy.  It was elected to use such lordotic cages because the preoperative lordosis at this level was 11 degrees.  Decompression involved painstaking removal of spondylytic and stenotic material as well as removal of synovial cysts.  10 cc of autograft/Osteocel was packed in the interspace medial to the second cage.   Remaining screws were placed at L 5 and 30 mm rod  was placed on the right and a 35 mm rod was placed on the left.   And the screws were locked and torqued.Final Xrays showed well positioned implants and screw fixation. The posterolateral region was packed with remaining 10 cc of autograft on each of midline. Long-acting Marcaine was injected into the deep musculature.  The wounds were irrigated and then closed with 1, 2-0 and 3-0 Vicryl stitches. Sterile occlusive dressing was placed with Dermabond and an occlusive dressing. The patient was then extubated in the operating room and taken to recovery in stable and satisfactory condition having tolerated her operation well. Counts were correct at the end of the case.  PLAN OF CARE: Admit to inpatient   PATIENT DISPOSITION:  PACU - hemodynamically stable.   Delay start of Pharmacological VTE agent (>24hrs) due to surgical blood loss or risk of bleeding: yes

## 2016-07-10 NOTE — Anesthesia Preprocedure Evaluation (Addendum)
Anesthesia Evaluation  Patient identified by MRN, date of birth, ID band Patient awake    Airway Mallampati: II  TM Distance: >3 FB Neck ROM: Full    Dental  (+) Teeth Intact   Pulmonary pneumonia,    breath sounds clear to auscultation       Cardiovascular hypertension,  Rhythm:Regular Rate:Normal     Neuro/Psych negative neurological ROS  negative psych ROS   GI/Hepatic Neg liver ROS, GERD  ,  Endo/Other  diabetes, Type 1  Renal/GU   negative genitourinary   Musculoskeletal  (+) Arthritis ,   Abdominal   Peds negative pediatric ROS (+)  Hematology negative hematology ROS (+)   Anesthesia Other Findings   Reproductive/Obstetrics negative OB ROS                            Lab Results  Component Value Date   WBC 11.4 (H) 06/19/2016   HGB 14.8 06/19/2016   HCT 46.1 (H) 06/19/2016   MCV 92.2 06/19/2016   PLT 335 06/19/2016   Lab Results  Component Value Date   CREATININE 0.94 06/19/2016   BUN 24 (H) 06/19/2016   NA 139 06/19/2016   K 4.3 06/19/2016   CL 105 06/19/2016   CO2 28 06/19/2016   Lab Results  Component Value Date   INR 0.9 09/11/2007   06/2016 EKG: normal sinus rhythm.   Anesthesia Physical Anesthesia Plan  ASA: III  Anesthesia Plan: General   Post-op Pain Management:    Induction: Intravenous  Airway Management Planned: Oral ETT and Video Laryngoscope Planned  Additional Equipment:   Intra-op Plan:   Post-operative Plan: Extubation in OR  Informed Consent: I have reviewed the patients History and Physical, chart, labs and discussed the procedure including the risks, benefits and alternatives for the proposed anesthesia with the patient or authorized representative who has indicated his/her understanding and acceptance.     Plan Discussed with: CRNA  Anesthesia Plan Comments:        Anesthesia Quick Evaluation

## 2016-07-10 NOTE — Progress Notes (Signed)
Spoke with patient about her diabetes and the use of her insulin pump. Was diagnosed with Type 1 diabetes in December, 1966. Has an Accu-chek insulin pump which was suspended during surgery.  CBG following surgery was 72 mg/dl and was given some regular soda. CBG at prior to lunch was 232 mg/dl. Has restarted her pump prior to lunch.  Sees Dr. Jacky Kindle for her PCP.   Basal rates: 12 MN-4 am  1.00 unit/hr.   4-7 am   4 units/hr   7 am-12 noon  2.7 units/hr.   12 noon- 5 pm   2.8 units/hr.   5 pm- 12 MN   2.5 units/hr.   CHO coverage: (own system of coverage)  Breakfast   2 CHO   2.5 units  Lunch   ? CHO    4-6 units  Dinner  ? CHO    4-6 units  Correction factor: 1 U if > 200 mg/dl and for every 50 points about 200 mg/dl  Likes to keep blood sugars < 180 mg/dl. Prefers being greater than 150 mg/dl at HS>   Will continue to monitor blood sugars while in the hospital. Smith Mince RN BSN CDE

## 2016-07-10 NOTE — Interval H&P Note (Signed)
History and Physical Interval Note:  07/10/2016 7:14 AM  Wanda Hicks  has presented today for surgery, with the diagnosis of Spondylolisthesis, Lumbar region  The various methods of treatment have been discussed with the patient and family. After consideration of risks, benefits and other options for treatment, the patient has consented to  Procedure(s) with comments: L4-5 Maximum access posterior lumbar interbody fusion with resection of synovial cyst (N/A) - L4-5 Maximum access posterior lumbar interbody fusion with resection of synovial cyst as a surgical intervention .  The patient's history has been reviewed, patient examined, no change in status, stable for surgery.  I have reviewed the patient's chart and labs.  Questions were answered to the patient's satisfaction.     Alysson Geist D

## 2016-07-10 NOTE — Brief Op Note (Signed)
07/10/2016  10:37 AM  PATIENT:  Wanda Hicks  55 y.o. 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  SURGEON:  Surgeon(s) and Role:    * Yalissa Fink, MD - Primary  PHYSICIAN ASSISTANT: Jones, MD  ASSISTANTS: Poteat, RN    Williams, PAS  ANESTHESIA:   local and general  EBL:  Total I/O In: 1000 [I.V.:1000] Out: 450 [Urine:200; Blood:250]  BLOOD ADMINISTERED:none  DRAINS: none   LOCAL MEDICATIONS USED:  MARCAINE    and LIDOCAINE   SPECIMEN:  No Specimen  DISPOSITION OF SPECIMEN:  N/A  COUNTS:  YES  TOURNIQUET:  * No tourniquets in log *  DICTATION: DICTATION: Patient is a 59-year-old with spondylosis , stenosis, spondylolisthesis, disc herniation and severe back and bilateral lower extremity pain at L4/5 levels of the lumbar spine. It was elected to take her to surgery for MASPLIF L 45 level with posterolateral arthrodesis along with resection of synovial cysts.  Procedure:   Following uncomplicated induction of GETA, and placement of electrodes for neural monitoring, patient was turned into a prone position on the [place] tableand using AP  fluoroscopy the area of planned incision was marked, prepped with betadine scrub and Duraprep, then draped. Exposure was performed of facet joint complex at L 45 level and the MAS retractor was placed.5.5 x 35 mm cortical Nuvasive screws were placed at L 4 bilaterally according to standard landmarks using neural monitoring.  A total laminectomy of L 4 was then performed with  disarticulation of facets.  This bone was saved for grafting, combined with Osteocel after being run through bone mill and was placed in bone packing device.  Thorough discectomy was performed bilaterally at L 45 and the endplates were prepared for grafting.  23 x 8 x 15 degree cages were placed in the interspace and positioning was confirmed with AP and lateral fluoroscopy.  It was elected to use such lordotic cages because the preoperative lordosis at this level was 11 degrees.  Decompression involved painstaking removal of spondylytic and stenotic material as well as removal of synovial cysts.  10 cc of autograft/Osteocel was packed in the interspace medial to the second cage.   Remaining screws were placed at L 5 and 30 mm rod  was placed on the right and a 35 mm rod was placed on the left.   And the screws were locked and torqued.Final Xrays showed well positioned implants and screw fixation. The posterolateral region was packed with remaining 10 cc of autograft on each of midline. Long-acting Marcaine was injected into the deep musculature.  The wounds were irrigated and then closed with 1, 2-0 and 3-0 Vicryl stitches. Sterile occlusive dressing was placed with Dermabond and an occlusive dressing. The patient was then extubated in the operating room and taken to recovery in stable and satisfactory condition having tolerated her operation well. Counts were correct at the end of the case.  PLAN OF CARE: Admit to inpatient   PATIENT DISPOSITION:  PACU - hemodynamically stable.   Delay start of Pharmacological VTE agent (>24hrs) due to surgical blood loss or risk of bleeding: yes  

## 2016-07-10 NOTE — Progress Notes (Signed)
Late entry for 1042: Patient arrived from OR with insulin pump disconnected. CRNA reports CBG was checked every hour. Dr. Venetia Maxon needs order to put in insulin pump back on. Daphine Deutscher RN-diabetic coordinator paged. With help of diabetic coordinator orders put in for insulin pump and she also said ok for patient to hook insulin pump back on and that she will see patient when she's in her room.

## 2016-07-10 NOTE — Progress Notes (Signed)
Dr Hart Rochester informed pt has type 1 DM and is on an insulin pump.

## 2016-07-10 NOTE — Progress Notes (Signed)
PHARMACIST - PHYSICIAN ORDER COMMUNICATION  CONCERNING: P&T Medication Policy on Herbal Medications  DESCRIPTION:  This patient's order for:  [employer]  has been noted.  This product(s) is classified as an "herbal" or natural product. Due to a lack of definitive safety studies or FDA approval, nonstandard manufacturing practices, plus the potential risk of unknown drug-drug interactions while on inpatient medications, the Pharmacy and Therapeutics Committee does not permit the use of "herbal" or natural products of this type within [facility].   ACTION TAKEN: The pharmacy department is unable to verify this order at this time and your patient has been informed of this safety policy. Please reevaluate patient's clinical condition at discharge and address if the herbal or natural product(s) should be resumed at that time.  Link Snuffer, PharmD, BCPS Clinical Pharmacist [phone] 07/10/2016, 1:55 PM

## 2016-07-10 NOTE — Anesthesia Postprocedure Evaluation (Signed)
Anesthesia Post Note  Patient: CAGNEY FALSO  Procedure(s) Performed: Procedure(s) (LRB): Lumbar four-five Maximum access posterior lumbar interbody fusion with resection of synovial cyst (N/A)  Patient location during evaluation: PACU Anesthesia Type: General Level of consciousness: awake and alert Pain management: pain level controlled Vital Signs Assessment: post-procedure vital signs reviewed and stable Respiratory status: spontaneous breathing, nonlabored ventilation, respiratory function stable and patient connected to nasal cannula oxygen Cardiovascular status: blood pressure returned to baseline and stable Postop Assessment: no signs of nausea or vomiting Anesthetic complications: no    Last Vitals:  Vitals:   07/10/16 1200 07/10/16 1228  BP: (!) 155/75 (!) 158/66  Pulse: 81 93  Resp: 13 18  Temp: 37.1 C 36.9 C    Last Pain:  Vitals:   07/10/16 1300  TempSrc:   PainSc: 8                  Shelton Silvas

## 2016-07-10 NOTE — Transfer of Care (Signed)
Immediate Anesthesia Transfer of Care Note  Patient: Wanda Hicks  Procedure(s) Performed: Procedure(s): Lumbar four-five Maximum access posterior lumbar interbody fusion with resection of synovial cyst (N/A)  Patient Location: PACU  Anesthesia Type:General  Level of Consciousness: awake, alert  and oriented  Airway & Oxygen Therapy: Patient Spontanous Breathing and Patient connected to nasal cannula oxygen  Post-op Assessment: Report given to RN and Post -op Vital signs reviewed and stable  Post vital signs: Reviewed and stable  Last Vitals:  Vitals:   07/10/16 0658 07/10/16 1042  BP: 140/64   Pulse: 65   Resp: 20   Temp: 37 C 36.8 C    Last Pain:  Vitals:   07/10/16 0658  TempSrc: Oral  PainSc:       Patients Stated Pain Goal: 3 (07/10/16 0646)  Complications: No apparent anesthesia complications

## 2016-07-10 NOTE — Anesthesia Procedure Notes (Signed)
Procedure Name: Intubation Date/Time: 07/10/2016 7:40 AM Performed by: Dairl Ponder Pre-anesthesia Checklist: Patient identified, Emergency Drugs available, Suction available, Patient being monitored and Timeout performed Patient Re-evaluated:Patient Re-evaluated prior to inductionOxygen Delivery Method: Circle system utilized Preoxygenation: Pre-oxygenation with 100% oxygen Intubation Type: IV induction Ventilation: Mask ventilation without difficulty Laryngoscope Size: Glidescope and 3 (limited neck ROM) Grade View: Grade I Tube type: Oral Tube size: 7.0 mm Number of attempts: 1 Airway Equipment and Method: Stylet Placement Confirmation: ETT inserted through vocal cords under direct vision,  positive ETCO2 and breath sounds checked- equal and bilateral Secured at: 23 cm Tube secured with: Tape Dental Injury: Teeth and Oropharynx as per pre-operative assessment

## 2016-07-11 LAB — GLUCOSE, CAPILLARY
GLUCOSE-CAPILLARY: 55 mg/dL — AB (ref 65–99)
GLUCOSE-CAPILLARY: 104 mg/dL — AB (ref 65–99)
GLUCOSE-CAPILLARY: 63 mg/dL — AB (ref 65–99)
Glucose-Capillary: 161 mg/dL — ABNORMAL HIGH (ref 65–99)
Glucose-Capillary: 55 mg/dL — ABNORMAL LOW (ref 65–99)

## 2016-07-11 MED ORDER — TRAMADOL HCL 50 MG PO TABS: 50.0000 mg | ORAL_TABLET | Freq: Four times a day (QID) | ORAL | 0 refills | Status: DC | PRN

## 2016-07-11 MED ORDER — CYCLOBENZAPRINE HCL 10 MG PO TABS: 10.0000 mg | ORAL_TABLET | Freq: Three times a day (TID) | ORAL | 0 refills | Status: DC | PRN

## 2016-07-11 MED ORDER — GABAPENTIN 300 MG PO CAPS: 300.0000 mg | ORAL_CAPSULE | Freq: Two times a day (BID) | ORAL | 0 refills | Status: DC

## 2016-07-11 NOTE — Evaluation (Signed)
Occupational Therapy Evaluation Patient Details Name: Wanda Hicks MRN: [id number] DOB: September 27, 1961 Today's Date: 07/11/2016    History of Present Illness 55 yo female s/p L4-L5 fusion 07/10/16.    Clinical Impression   Patient is s/p L4-5  surgery resulting in functional limitations due to the deficits listed below (see OT problem list).  Patient will benefit from skilled OT acutely to increase independence and safety with ADLS to allow discharge home without follow up .  Back handout provided and reviewed adls in detail. Pt educated on: clothing between brace, never sleep in brace, set an alarm at night for medication, avoid sitting for long periods of time, correct bed positioning for sleeping, correct sequence for bed mobility, avoiding lifting more than 5 pounds and never wash directly over incision. All education is complete and patient indicates understanding.       Follow Up Recommendations  No OT follow up    Equipment Recommendations  None recommended by OT    Recommendations for Other Services       Precautions / Restrictions Precautions Precautions: Back Precaution Comments: handout provided and reviewed for adls in detail  Required Braces or Orthoses: Spinal Brace Spinal Brace: Lumbar corset;Applied in sitting position Restrictions Weight Bearing Restrictions: No      Mobility Bed Mobility Overal bed mobility: Modified Independent             General bed mobility comments: rails removed to help patient demo home level  Transfers Overall transfer level: Needs assistance Equipment used: Rolling walker (2 wheeled) Transfers: Sit to/from Stand Sit to Stand: Supervision         General transfer comment: for safety    Balance                                            ADL Overall ADL's : Modified independent                                       General ADL Comments: Pt completed grooming and dressing at  MOD I . pt needed cues for bending during session     Vision     Perception     Praxis      Pertinent Vitals/Pain Pain Assessment: Faces Pain Score: 5  Faces Pain Scale: Hurts little more Pain Location: back Pain Descriptors / Indicators: Operative site guarding Pain Intervention(s): Repositioned;Premedicated before session;Monitored during session     Hand Dominance Right   Extremity/Trunk Assessment Upper Extremity Assessment Upper Extremity Assessment: Overall WFL for tasks assessed   Lower Extremity Assessment Lower Extremity Assessment: Overall WFL for tasks assessed   Cervical / Trunk Assessment Cervical / Trunk Assessment: Other exceptions (s/p surg)   Communication Communication Communication: No difficulties   Cognition Arousal/Alertness: Awake/alert Behavior During Therapy: WFL for tasks assessed/performed Overall Cognitive Status: Within Functional Limits for tasks assessed                     General Comments       Exercises       Shoulder Instructions      Home Living Family/patient expects to be discharged to:: Private residence Living Arrangements: Spouse/significant other Available Help at Discharge: Family Type of Home: House Home Access: Stairs to enter [employer] of Steps:  1+1 Entrance Stairs-Rails: None Home Layout: One level     Bathroom Shower/Tub: Producer, television/film/video: Standard     Home Equipment: Environmental consultant - 2 wheels          Prior Functioning/Environment Level of Independence: Independent             OT Diagnosis:     OT Problem List:     OT Treatment/Interventions:      OT Goals(Current goals can be found in the care plan section) Acute Rehab OT Goals Patient Stated Goal: return to PLOF Potential to Achieve Goals: Good  OT Frequency:     Barriers to D/C:            Co-evaluation              End of Session Equipment Utilized During Treatment: Gait belt;Rolling  walker;Back brace Nurse Communication: Mobility status;Precautions  Activity Tolerance: Patient tolerated treatment well Patient left: Other (comment) (hall with PT [place])   Time: 1610-9604 OT Time Calculation (min): 25 min Charges:  OT General Charges $OT Visit: 1 Procedure OT Evaluation $OT Eval Moderate Complexity: 1 Procedure OT Treatments $Self Care/Home Management : 8-22 mins G-Codes:    Boone Master B August 10, 2016, 9:51 AM  Mateo Flow   OTR/L Pager: [phone] Office: [phone] .

## 2016-07-11 NOTE — Evaluation (Signed)
Physical Therapy Evaluation Patient Details Name: Wanda Hicks MRN: [id number] DOB: 12-30-1960 Today's Date: 07/11/2016   History of Present Illness  55 yo female s/p L4-L5 fusion 07/10/16.   Clinical Impression  On eval, pt was supervision-Min guard assist for mobility. She walked ~150 feet with RW. Pain rated 5/10 with activity. Verbally reviewed car transfer and stair negotiation technique. All education completed.     Follow Up Recommendations No PT follow up;Supervision for mobility/OOB    Equipment Recommendations  None recommended by PT    Recommendations for Other Services       Precautions / Restrictions Precautions Precautions: Back Precaution Comments: OT issued back prec handout Required Braces or Orthoses: Spinal Brace Spinal Brace: Lumbar corset Restrictions Weight Bearing Restrictions: No      Mobility  Bed Mobility               General bed mobility comments: oob with OT at start of session  Transfers Overall transfer level: Needs assistance Equipment used: Rolling walker (2 wheeled) Transfers: Sit to/from Stand Sit to Stand: Supervision         General transfer comment: for safety  Ambulation/Gait Ambulation/Gait assistance: Min guard Ambulation Distance (Feet): 150 Feet Assistive device: Rolling walker (2 wheeled) Gait Pattern/deviations: Step-through pattern;Decreased stride length     General Gait Details: slow but steady gait speed with use of walker.   Stairs Stairs:  (Verbally reviewed stair technique)          Wheelchair Mobility    Modified Rankin (Stroke Patients Only)       Balance                                             Pertinent Vitals/Pain Pain Assessment: 0-10 Pain Score: 5  Pain Location: back Pain Descriptors / Indicators: Aching Pain Intervention(s): Monitored during session    Home Living Family/patient expects to be discharged to:: Private residence Living Arrangements:  Spouse/significant other Available Help at Discharge: Family   Home Access: Stairs to enter Entrance Stairs-Rails: None Secretary/administrator of Steps: 1+1   Home Equipment: Environmental consultant - 2 wheels      Prior Function Level of Independence: Independent               Hand Dominance        Extremity/Trunk Assessment   Upper Extremity Assessment: Defer to OT evaluation           Lower Extremity Assessment: Overall WFL for tasks assessed      Cervical / Trunk Assessment: Normal  Communication   Communication: No difficulties  Cognition Arousal/Alertness: Awake/alert Behavior During Therapy: WFL for tasks assessed/performed Overall Cognitive Status: Within Functional Limits for tasks assessed                      General Comments      Exercises        Assessment/Plan    PT Assessment Patent does not need any further PT services  PT Diagnosis Difficulty walking;Acute pain   PT Problem List    PT Treatment Interventions     PT Goals (Current goals can be found in the Care Plan section) Acute Rehab PT Goals Patient Stated Goal: regain PLOF PT Goal Formulation: All assessment and education complete, DC therapy    Frequency     Barriers to discharge  Co-evaluation               End of Session   Activity Tolerance: Patient tolerated treatment well Patient left: in chair;with call bell/phone within reach           Time: 0832-0843 PT Time Calculation (min) (ACUTE ONLY): 11 min   Charges:   PT Evaluation $PT Eval Low Complexity: 1 Procedure     PT G Codes:        Rebeca Alert, MPT Pager: [phone]

## 2016-07-11 NOTE — Discharge Instructions (Signed)

## 2016-07-11 NOTE — Discharge Summary (Signed)
Physician Discharge Summary  Patient ID: Wanda Hicks MRN: [id number] DOB/AGE: [birth year] 55 y.o.  Admit date: 07/10/2016 Discharge date: 07/11/2016  Admission Diagnoses: spondy    Discharge Diagnoses: same   Discharged Condition: good  Hospital Course: The patient was admitted on 07/10/2016 and taken to the operating room where the patient underwent PLIF L4-5. The patient tolerated the procedure well and was taken to the recovery room and then to the floor in stable condition. The hospital course was routine. There were no complications. The wound remained clean dry and intact. Pt had appropriate back soreness. No complaints of leg pain or new N/T/W. The patient remained afebrile with stable vital signs, and tolerated a regular diet. The patient continued to increase activities, and pain was well controlled with oral pain medications.   Consults: None  Significant Diagnostic Studies:  Results for orders placed or performed during the hospital encounter of 07/10/16  CBC  Result Value Ref Range   WBC 7.7 4.0 - 10.5 K/uL   RBC 4.42 3.87 - 5.11 MIL/uL   Hemoglobin 12.9 12.0 - 15.0 g/dL   HCT 40.9 81.1 - 91.4 %   MCV 91.4 78.0 - 100.0 fL   MCH 29.2 26.0 - 34.0 pg   MCHC 31.9 30.0 - 36.0 g/dL   RDW 78.2 95.6 - 21.3 %   Platelets 247 150 - 400 K/uL  Basic metabolic panel  Result Value Ref Range   Sodium 139 135 - 145 mmol/L   Potassium 3.8 3.5 - 5.1 mmol/L   Chloride 106 101 - 111 mmol/L   CO2 25 22 - 32 mmol/L   Glucose, Bld 117 (H) 65 - 99 mg/dL   BUN 19 6 - 20 mg/dL   Creatinine, Ser 0.86 0.44 - 1.00 mg/dL   Calcium 9.2 8.9 - 57.8 mg/dL   GFR calc non Af Amer >60 >60 mL/min   GFR calc Af Amer >60 >60 mL/min   Anion gap 8 5 - 15  Glucose, capillary  Result Value Ref Range   Glucose-Capillary 114 (H) 65 - 99 mg/dL   Comment 1 Notify RN   Glucose, capillary  Result Value Ref Range   Glucose-Capillary 79 65 - 99 mg/dL  Glucose, capillary  Result Value Ref Range    Glucose-Capillary 72 65 - 99 mg/dL   Comment 1 Notify RN    Comment 2 Document in Chart   Glucose, capillary  Result Value Ref Range   Glucose-Capillary 232 (H) 65 - 99 mg/dL   Comment 1 Notify RN    Comment 2 Document in Chart   Glucose, capillary  Result Value Ref Range   Glucose-Capillary 140 (H) 65 - 99 mg/dL   Comment 1 Notify RN    Comment 2 Document in Chart   Glucose, capillary  Result Value Ref Range   Glucose-Capillary 87 65 - 99 mg/dL   Comment 1 Notify RN    Comment 2 Document in Chart   Glucose, capillary  Result Value Ref Range   Glucose-Capillary 161 (H) 65 - 99 mg/dL   Comment 1 Notify RN    Comment 2 Document in Chart   Type and screen All Cardiac and thoracic surgeries, spinal fusions, myomectomies, craniotomies, colon & liver resections, total joint revisions, same day c-section with placenta previa or accreta.  Result Value Ref Range   ABO/RH(D) O POS    Antibody Screen NEG    Sample Expiration 07/13/2016     Dg Lumbar Spine 2-3 Views  Result Date:  07/10/2016 CLINICAL DATA:  Surgery: Lumbar four-five Maximum access posterior lumbar interbody fusion with resection of synovial cyst Fluoro Time: 49 Sec EXAM: LUMBAR SPINE - 2-3 VIEW; DG C-ARM 61-120 MIN COMPARISON:  Multiple prior studies including 06/02/2016, 05/17/2016 FINDINGS: Numbering based on prior studies. FILM #1: Performed at 8:35 a.m. Surgical instrument is identified posterior to the L4 vertebral body. FILM #2: Performed 9:59 a.m. Transpedicular screws have been placed at L5 and L4. Interbody fusion devices identified at L4-5. FILM #3: Performed at 10 o'clock a.m. Frontal view performed, demonstrating transpedicular screws at L4-5 with interbody fusion devices. IMPRESSION: Posterior fusion L4-5. Electronically Signed   By: Norva Pavlov M.D.   On: 07/10/2016 10:35  Dg C-arm 61-120 Min  Result Date: 07/10/2016 CLINICAL DATA:  Surgery: Lumbar four-five Maximum access posterior lumbar interbody fusion  with resection of synovial cyst Fluoro Time: 49 Sec EXAM: LUMBAR SPINE - 2-3 VIEW; DG C-ARM 61-120 MIN COMPARISON:  Multiple prior studies including 06/02/2016, 05/17/2016 FINDINGS: Numbering based on prior studies. FILM #1: Performed at 8:35 a.m. Surgical instrument is identified posterior to the L4 vertebral body. FILM #2: Performed 9:59 a.m. Transpedicular screws have been placed at L5 and L4. Interbody fusion devices identified at L4-5. FILM #3: Performed at 10 o'clock a.m. Frontal view performed, demonstrating transpedicular screws at L4-5 with interbody fusion devices. IMPRESSION: Posterior fusion L4-5. Electronically Signed   By: Norva Pavlov M.D.   On: 07/10/2016 10:35   Antibiotics:  Anti-infectives    Start     Dose/Rate Route Frequency Ordered Stop   07/10/16 1600  ceFAZolin (ANCEF) IVPB 2g/100 mL premix     2 g 200 mL/hr over 30 Minutes Intravenous Every 8 hours 07/10/16 1235 07/11/16 0041   07/10/16 0727  ceFAZolin (ANCEF) 2-4 GM/100ML-% IVPB    Comments:  Dairl Ponder   : cabinet override      07/10/16 0727 07/10/16 1944   07/10/16 0700  ceFAZolin (ANCEF) IVPB 2g/100 mL premix     2 g 200 mL/hr over 30 Minutes Intravenous To [facility] 07/09/16 1351 07/10/16 0800      Discharge Exam: Blood pressure (!) 109/48, pulse 65, temperature 97.9 F (36.6 C), temperature source Oral, resp. rate 18, height 5\' 4"  (1.626 m), weight 94.3 kg (208 lb), SpO2 97 %. Neurologic: Grossly normal Dressing dry  Discharge Medications:     Medication List    STOP taking these medications   Diclofenac Sodium CR 100 MG 24 hr tablet     TAKE these medications   ACCU-CHEK AVIVA PLUS test strip Generic drug:  glucose blood   ACCU-CHEK FASTCLIX LANCETS Misc   acetaminophen 500 MG tablet Commonly known as:  TYLENOL Take 1,000 mg by mouth 2 (two) times daily.   ADVAIR DISKUS 100-50 MCG/DOSE Aepb Generic drug:  Fluticasone-Salmeterol Inhale 1 puff into the lungs daily as needed.    albuterol 108 (90 Base) MCG/ACT inhaler Commonly known as:  PROVENTIL HFA;VENTOLIN HFA Inhale 1 puff into the lungs every 6 (six) hours as needed for wheezing or shortness of breath.   aspirin 81 MG tablet Take 81 mg by mouth daily.   CALCIUM 600/VITAMIN D3 600-800 MG-UNIT Tabs Generic drug:  Calcium Carb-Cholecalciferol Take 1 tablet by mouth daily.   cyclobenzaprine 10 MG tablet Commonly known as:  FLEXERIL Take 1 tablet (10 mg total) by mouth 3 (three) times daily as needed.   fluticasone 50 MCG/ACT nasal spray Commonly known as:  FLONASE Place 1 spray into both nostrils daily.  furosemide 20 MG tablet Commonly known as:  LASIX Take 1 tablet (20 mg total) by mouth daily. <PLEASE MAKE APPOINTMENT FOR REFILLS>   gabapentin 300 MG capsule Commonly known as:  NEURONTIN Take 1 capsule (300 mg total) by mouth 2 (two) times daily. What changed:  when to take this   insulin pump Soln Inject into the skin.   JARDIANCE 10 MG Tabs tablet Generic drug:  empagliflozin Take 10 mg by mouth daily.   KRILL OIL PO Take 1 Dose by mouth daily.   levothyroxine 112 MCG tablet Commonly known as:  SYNTHROID, LEVOTHROID Take 1 tablet by mouth daily.   losartan 50 MG tablet Commonly known as:  COZAAR Take 1 tablet (50 mg total) by mouth daily.   multivitamin tablet Take 1 tablet by mouth daily.   NOVOLOG 100 UNIT/ML injection Generic drug:  insulin aspart Inject into the skin daily.   PENNSAID 2 % Soln Generic drug:  Diclofenac Sodium Apply 1 application topically 2 (two) times daily.   PRILOSEC OTC 20 MG tablet Generic drug:  omeprazole Take 1 tablet by mouth daily.   rosuvastatin 20 MG tablet Commonly known as:  CRESTOR Take 1 tablet (20 mg total) by mouth daily.   rosuvastatin 20 MG tablet Commonly known as:  CRESTOR Take 1 tablet (20 mg total) by mouth daily.   traMADol 50 MG tablet Commonly known as:  ULTRAM Take 1 tablet (50 mg total) by mouth every 6 (six)  hours as needed for moderate pain.       Disposition: home   Final Dx: PLIF L4-5  Discharge Instructions     Remove dressing in 72 hours    Complete by:  As directed   Call MD for:  difficulty breathing, headache or visual disturbances    Complete by:  As directed   Call MD for:  persistant nausea and vomiting    Complete by:  As directed   Call MD for:  redness, tenderness, or signs of infection (pain, swelling, redness, odor or green/yellow discharge around incision site)    Complete by:  As directed   Call MD for:  severe uncontrolled pain    Complete by:  As directed   Call MD for:  temperature >100.4    Complete by:  As directed   Diet - low sodium heart healthy    Complete by:  As directed   Discharge instructions    Complete by:  As directed   May shower, no bending or twisting, no heavy lifting   Increase activity slowly    Complete by:  As directed         Signed: Tauni Sanks S 07/11/2016, 7:26 AM

## 2016-07-11 NOTE — Progress Notes (Signed)
Pt doing well. Pt and husband given D/C instructions with Rx's, verbal understanding was given. Pt's incision is clean and dry with no sign of infection. Pt's IV was removed prior to D/C. Pt D?C'd home via wheelchair @ 1310 per MD order. Pt is stable @ D/C and has no other needs at this time. Rema Fendt, RN

## 2016-07-11 NOTE — Progress Notes (Signed)
Pt's CBG was 55, OJ was given and CBG went to 63. Pt is asymptomatic. Pt stated she would eat when she left the hospital and that she was fine, her blood sugars go up and down all the time. Rema Fendt, RN

## 2016-07-11 NOTE — Progress Notes (Signed)
Hypoglycemic Event  CBG: 55  Treatment: 15g - orange juice  Symptoms: None  Follow-up CBG: Time:1250 CBG Result:63  Possible Reasons for Event: Not eating  Comments/MD notified:None    Wanda Hicks, Truett Main[place]

## 2016-07-11 NOTE — Progress Notes (Signed)
Hypoglycemic Event  CBG: 55  Treatment: 15 grams, orange juice  Symptoms: None  Follow-up CBG: Time:0830 CBG Result:104  Possible Reasons for Event: Meds  Comments/MD notified:None    Monica Codd, Truett Main[place]

## 2016-07-16 ENCOUNTER — Encounter (HOSPITAL_COMMUNITY): Payer: Self-pay | Admitting: Neurosurgery

## 2016-07-16 ENCOUNTER — Other Ambulatory Visit: Payer: Self-pay | Admitting: Cardiovascular Disease

## 2016-07-16 NOTE — Telephone Encounter (Signed)
Rx(s) sent to pharmacy electronically.  

## 2016-07-31 ENCOUNTER — Encounter: Payer: Self-pay | Admitting: Cardiovascular Disease

## 2016-07-31 ENCOUNTER — Ambulatory Visit (INDEPENDENT_AMBULATORY_CARE_PROVIDER_SITE_OTHER): Payer: BC Managed Care – PPO | Admitting: Cardiovascular Disease

## 2016-07-31 VITALS — BP 140/74 | HR 90 | Ht 64.0 in | Wt 212.0 lb

## 2016-07-31 DIAGNOSIS — R011 Cardiac murmur, unspecified: Secondary | ICD-10-CM

## 2016-07-31 DIAGNOSIS — E1059 Type 1 diabetes mellitus with other circulatory complications: Secondary | ICD-10-CM | POA: Diagnosis not present

## 2016-07-31 DIAGNOSIS — E7800 Pure hypercholesterolemia, unspecified: Secondary | ICD-10-CM

## 2016-07-31 DIAGNOSIS — I10 Essential (primary) hypertension: Secondary | ICD-10-CM | POA: Diagnosis not present

## 2016-07-31 DIAGNOSIS — I5032 Chronic diastolic (congestive) heart failure: Secondary | ICD-10-CM

## 2016-07-31 NOTE — Progress Notes (Signed)
Cardiology Office Note    Date:  08/01/2016   ID:  [name], DOB [birth year], MRN [id number]  PCP:  Minda Meo, MD  Cardiologist:   Thurmon Fair, MD   Chief Complaint  Patient presents with  . Follow-up    yearly    History of Present Illness:  Wanda Hicks is a 55 y.o. female with a history of chronic diastolic failure in the setting of hypertension, hyperlipidemia and type 1 diabetes mellitus but without known coronary artery disease. She underwent uncomplicated lumbar spinal fusion about 3 weeks ago and is still wearing a brace but is walking without assistance. He denies problems with angina, dyspnea, palpitations, syncope, leg edema, claudication or neurological issues.  She has normal left ventricular systolic function and mild aortic valve sclerosis by echo in 2015. Normal nuclear stress test in 2015.  Past Medical History:  Diagnosis Date  . Arthritis   . Bronchitis   . Diabetes mellitus without complication (HCC)    Type 1; pt has insulin pump  . Family history of adverse reaction to anesthesia    Patients mother has N/V  . GERD (gastroesophageal reflux disease)   . Heart murmur   . Hypertension   . Pneumonia    hx of  . Positive PPD   . Shortness of breath dyspnea    uses Albuterol inhaler PRN    Past Surgical History:  Procedure Laterality Date  . CARPAL TUNNEL RELEASE Bilateral   . CERVICAL DISCECTOMY     X 2  . CESAREAN SECTION    . COLONOSCOPY    . EYE SURGERY     as a child  . MAXIMUM ACCESS (MAS)POSTERIOR LUMBAR INTERBODY FUSION (PLIF) 1 LEVEL N/A 07/10/2016   Procedure: Lumbar four-five Maximum access posterior lumbar interbody fusion with resection of synovial cyst;  Surgeon: Maeola Harman, MD;  Location: [facility];  Service: Neurosurgery;  Laterality: N/A;  . TRIGGER FINGER RELEASE Bilateral   . ULNAR NERVE REPAIR Bilateral     Current Medications: Outpatient Medications Prior to Visit  Medication Sig Dispense Refill  .  ACCU-CHEK AVIVA PLUS test strip   0  . ACCU-CHEK FASTCLIX LANCETS MISC     . acetaminophen (TYLENOL) 500 MG tablet Take 1,000 mg by mouth 2 (two) times daily.    Marland Kitchen ADVAIR DISKUS 100-50 MCG/DOSE AEPB Inhale 1 puff into the lungs daily as needed.     Marland Kitchen albuterol (PROVENTIL HFA;VENTOLIN HFA) 108 (90 Base) MCG/ACT inhaler Inhale 1 puff into the lungs every 6 (six) hours as needed for wheezing or shortness of breath.    Marland Kitchen aspirin 81 MG tablet Take 81 mg by mouth daily.    . Calcium Carb-Cholecalciferol (CALCIUM 600/VITAMIN D3) 600-800 MG-UNIT TABS Take 1 tablet by mouth daily.    . cyclobenzaprine (FLEXERIL) 10 MG tablet Take 1 tablet (10 mg total) by mouth 3 (three) times daily as needed. 30 tablet 0  . fluticasone (FLONASE) 50 MCG/ACT nasal spray Place 1 spray into both nostrils daily.    . furosemide (LASIX) 20 MG tablet Take 1 tablet (20 mg total) by mouth daily. <PLEASE MAKE APPOINTMENT FOR REFILLS> 90 tablet 0  . gabapentin (NEURONTIN) 300 MG capsule Take 1 capsule (300 mg total) by mouth 2 (two) times daily. 60 capsule 0  . Insulin Human (INSULIN PUMP) SOLN Inject into the skin.    Marland Kitchen JARDIANCE 10 MG TABS tablet Take 10 mg by mouth daily.  0  . KRILL OIL PO Take  1 Dose by mouth daily.    Marland Kitchen. levothyroxine (SYNTHROID, LEVOTHROID) 112 MCG tablet Take 1 tablet by mouth daily.    Marland Kitchen. losartan (COZAAR) 50 MG tablet Take 1 tablet (50 mg total) by mouth daily. 90 tablet 0  . Multiple Vitamin (MULTIVITAMIN) tablet Take 1 tablet by mouth daily.    Marland Kitchen. NOVOLOG 100 UNIT/ML injection Inject into the skin daily.     Marland Kitchen. PENNSAID 2 % SOLN Apply 1 application topically 2 (two) times daily.  1  . PRILOSEC OTC 20 MG tablet Take 1 tablet by mouth daily.    . rosuvastatin (CRESTOR) 20 MG tablet Take 1 tablet (20 mg total) by mouth daily. 30 tablet 0  . traMADol (ULTRAM) 50 MG tablet Take 1 tablet (50 mg total) by mouth every 6 (six) hours as needed for moderate pain. 60 tablet 0  . rosuvastatin (CRESTOR) 20 MG tablet  Take 1 tablet (20 mg total) by mouth daily. 90 tablet 3   No facility-administered medications prior to visit.      Allergies:   No known allergies and Latex   Social History   Social History  . Marital status: Married    Spouse name: N/A  . Number of children: N/A  . Years of education: N/A   Social History Main Topics  . Smoking status: Never Smoker  . Smokeless tobacco: Never Used  . Alcohol use No  . Drug use: No  . Sexual activity: Not Asked   Other Topics Concern  . None   Social History Narrative  . None     Family History:  The patient's family history includes Alcoholism in her father; Cancer in her maternal grandfather; Hypertension in her mother; Stroke in her paternal grandfather.   ROS:   Please see the history of present illness.    ROS All other systems reviewed and are negative.   PHYSICAL EXAM:   VS:  BP 140/74   Pulse 90   Ht 5\' 4"  (1.626 m)   Wt 212 lb (96.2 kg)   BMI 36.39 kg/m    GEN: Well nourished, well developed, in no acute distress  HEENT: normal  Neck: no JVD, carotid bruits, or masses Cardiac: Normal first and second heart sounds, grade 2/6 early peaking systolic ejection murmur in the aortic focus, RRR; no diastolic murmurs, rubs, or gallops,no edema  Respiratory:  clear to auscultation bilaterally, normal work of breathing GI: soft, nontender, nondistended, + BS MS: no deformity or atrophy  Skin: warm and dry, no rash Neuro:  Alert and Oriented x 3, Strength and sensation are intact Psych: euthymic mood, full affect  Wt Readings from Last 3 Encounters:  07/31/16 212 lb (96.2 kg)  07/10/16 208 lb (94.3 kg)  06/19/16 208 lb 14.4 oz (94.8 kg)      Studies/Labs Reviewed:   EKG:  EKG is not ordered today.  The ekg ordered 06/19/2016 demonstrates Normal sinus rhythm with borderline voltage criteria for LVH  Recent Labs: 07/10/2016: BUN 19; Creatinine, Ser 0.79; Hemoglobin 12.9; Platelets 247; Potassium 3.8; Sodium 139   Lipid  Panel Total cholesterol 163, triglycerides 77, HDL 44, LDL 104, hemoglobin A1c 6.4%    ASSESSMENT:    1. Chronic diastolic heart failure (HCC)   2. Essential hypertension   3. Hypercholesteremia   4. DM type 1 causing vascular disease (HCC)   5. Murmur      PLAN:  In order of problems listed above:  1. CHF: Well compensated, euvolemic on a very  low dose of diuretics 2. HTN: Satisfactory blood pressure on current medications, usually lower at home than it is in the office today 3. HLP: all lipid parameters are within the desirable range 4. DM: Excellent glycemic control 5. Murmur of aortic valve sclerosis, unchanged    Medication Adjustments/Labs and Tests Ordered: Current medicines are reviewed at length with the patient today.  Concerns regarding medicines are outlined above.  Medication changes, Labs and Tests ordered today are listed in the Patient Instructions below. Patient Instructions  Dr Royann Shiversroitoru recommends that you schedule a follow-up appointment in 12 months. You will receive a reminder letter in the mail two months in advance. If you don't receive a letter, please call our office to schedule the follow-up appointment.  If you need a refill on your cardiac medications before your next appointment, please call your pharmacy.    Signed, Thurmon FairMihai Carlota Philley, MD  08/01/2016 6:17 PM    [facility] [address], [place], [place]  [address] Phone: [phone]; Fax: [phone]

## 2016-07-31 NOTE — Patient Instructions (Signed)
Dr Croitoru recommends that you schedule a follow-up appointment in 12 months. You will receive a reminder letter in the mail two months in advance. If you don't receive a letter, please call our office to schedule the follow-up appointment.  If you need a refill on your cardiac medications before your next appointment, please call your pharmacy. 

## 2016-08-01 ENCOUNTER — Encounter: Payer: Self-pay | Admitting: Cardiovascular Disease

## 2016-10-19 ENCOUNTER — Other Ambulatory Visit: Payer: Self-pay | Admitting: Cardiovascular Disease

## 2016-10-20 NOTE — Telephone Encounter (Signed)
Rx request sent to pharmacy.  

## 2016-10-24 ENCOUNTER — Telehealth: Payer: Self-pay | Admitting: Cardiovascular Disease

## 2016-10-24 DIAGNOSIS — Z79899 Other long term (current) drug therapy: Secondary | ICD-10-CM | POA: Insufficient documentation

## 2016-10-24 MED ORDER — FUROSEMIDE 40 MG PO TABS: 40.0000 mg | ORAL_TABLET | Freq: Every day | ORAL | 5 refills | Status: DC

## 2016-10-24 NOTE — Telephone Encounter (Signed)
Pt notified, she will start new lasix dose today. New rx sent to [facility]

## 2016-10-24 NOTE — Telephone Encounter (Signed)
Per pt call her ankles have bee swelling for the passed week and a half.   Pt would like a call back to see what maybe causing it.   Pt also stated occasional wheezing for about  Month now.

## 2016-10-24 NOTE — Telephone Encounter (Signed)
Change lasix to 40 mg daily; bmet one week with results to Dr Royann Shiversroitoru. Olga MillersBrian Crenshaw

## 2016-10-24 NOTE — Telephone Encounter (Signed)
Spoke with pt states that been having bilateral ankle and foot swelling but more so on the right side, her weight is up 5 pounds from what is normally is she states. Pt states that she is taking her medications as ordered. Pt did state that she is having SOB and wheezing, but she frequently has SOB and wheezing because her allergies make her wheeze she also has h/o frequent bronchitis.  She states that her BP is normal she takes it at work and really doesn't remember the numbers but thinks that it is running 110/60's and pulse is running 70-80's She is taking lasix 20 mg and states that she tried to take it BID over last weekend and states that she did not think it help and went back to 20 mg daily. She states that she does not "feel bad" she is used to the SOB and wheezing but is afraid that the swelling means something else, and would like to try to get this down. Pt [facility] [address]. Please advise

## 2017-02-12 IMAGING — RF DG LUMBAR SPINE 2-3V
1 series · 3 of 3 positions shown · non-contrast
Comparison: Multiple prior studies including 06/02/2016, 05/17/2016

CLINICAL DATA: Surgery: Lumbar four-five Maximum access posterior
lumbar interbody fusion with resection of synovial cyst Fluoro Time:
49 Sec

EXAM:
LUMBAR SPINE - 2-3 VIEW; DG C-ARM 61-120 MIN

[Series 1: run · 3 of 3 slices shown]
[im 1/3]
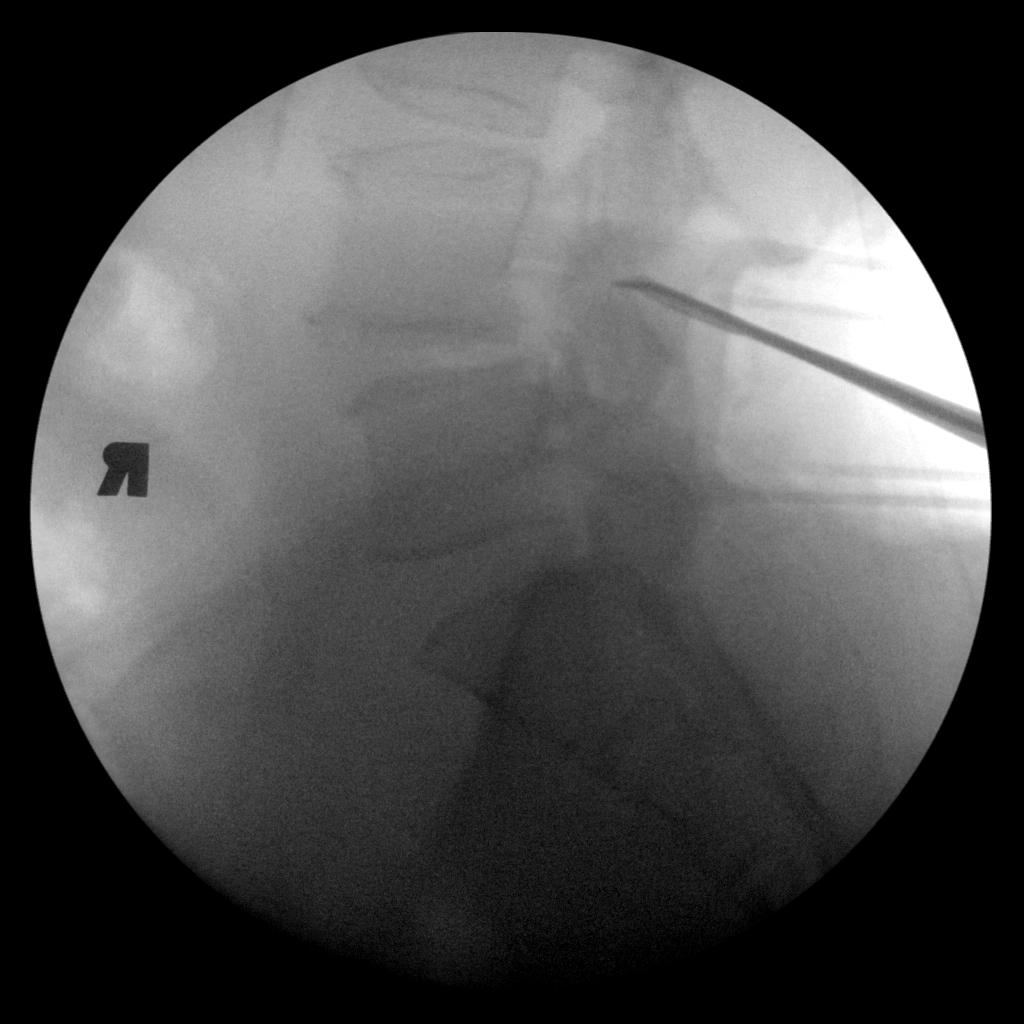
[im 2/3]
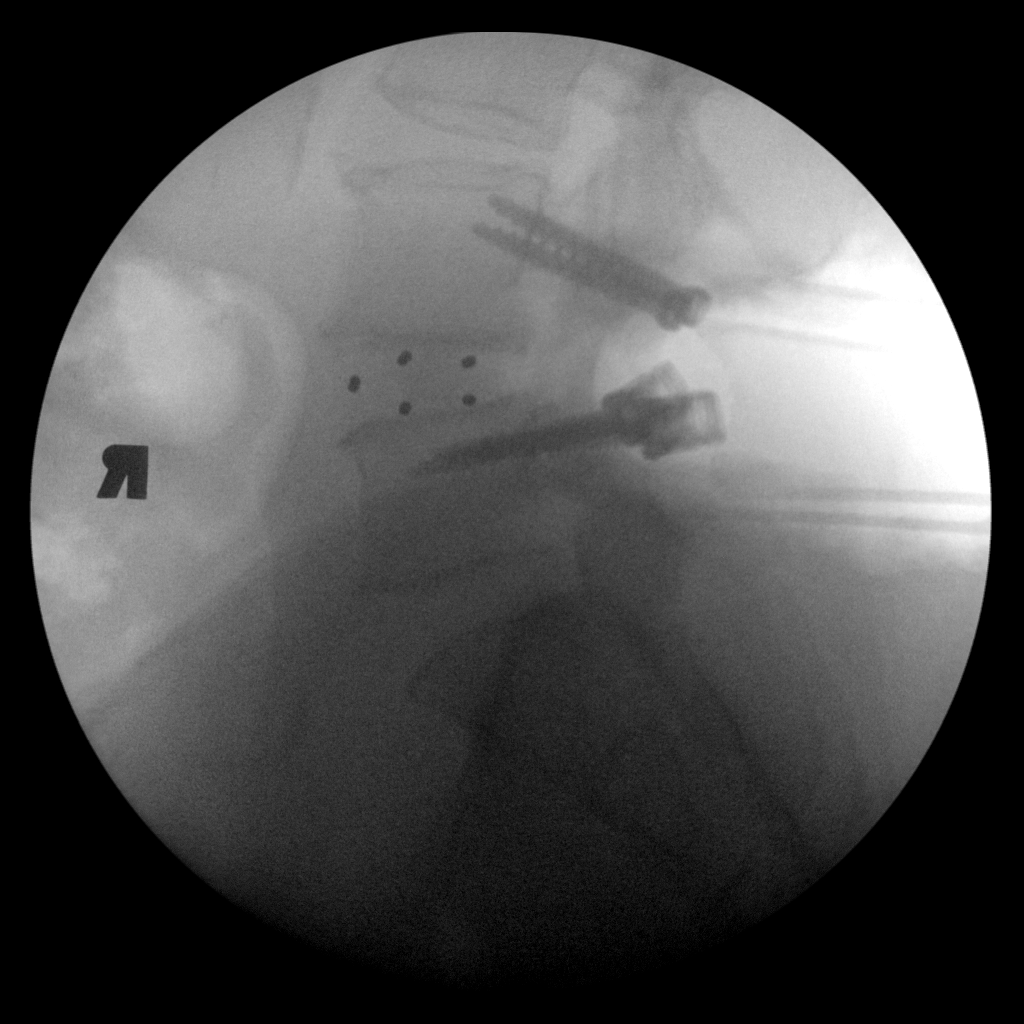
[im 3/3]
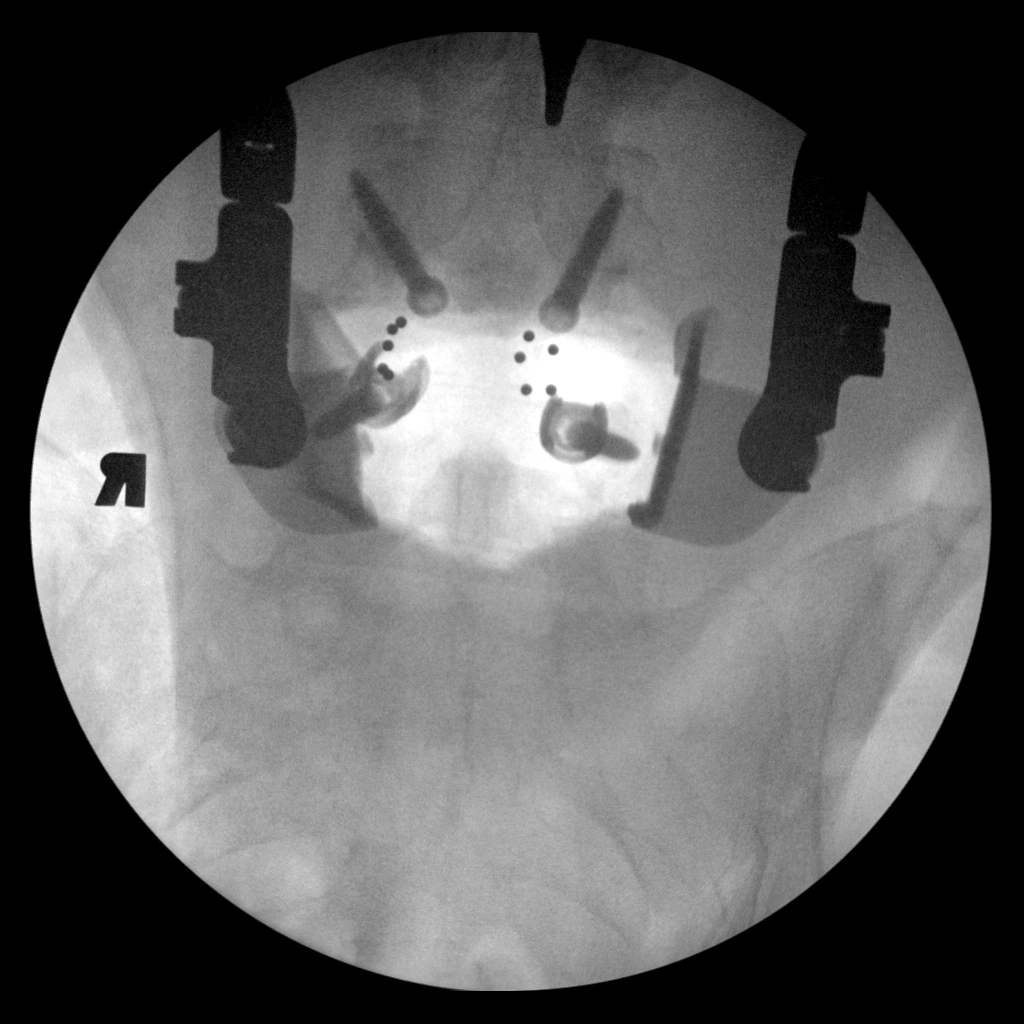

[3 of 3 positions shown; findings below may reference images not displayed]

FINDINGS: Numbering based on prior studies.

FILM #1: Performed at [DATE] a.m.. Surgical instrument is identified
posterior to the L4 vertebral body.

FILM #2: Performed [DATE] a.m.. Transpedicular screws have been placed
at L5 and L4. Interbody fusion devices identified at L4-5.

FILM #3: Performed at 10 o'clock a.m.. Frontal view performed,
demonstrating transpedicular screws at L4-5 with interbody fusion
devices.
IMPRESSION: Posterior fusion L4-5.

## 2017-04-01 ENCOUNTER — Ambulatory Visit (INDEPENDENT_AMBULATORY_CARE_PROVIDER_SITE_OTHER): Payer: BC Managed Care – PPO

## 2017-04-01 ENCOUNTER — Encounter: Payer: Self-pay | Admitting: Podiatry

## 2017-04-01 ENCOUNTER — Ambulatory Visit (INDEPENDENT_AMBULATORY_CARE_PROVIDER_SITE_OTHER): Payer: BC Managed Care – PPO | Admitting: Podiatry

## 2017-04-01 DIAGNOSIS — M79671 Pain in right foot: Secondary | ICD-10-CM

## 2017-04-01 DIAGNOSIS — M7661 Achilles tendinitis, right leg: Secondary | ICD-10-CM

## 2017-04-01 DIAGNOSIS — M779 Enthesopathy, unspecified: Secondary | ICD-10-CM

## 2017-04-01 MED ORDER — TRIAMCINOLONE ACETONIDE 10 MG/ML IJ SUSP
10.0000 mg | Freq: Once | INTRAMUSCULAR | Status: AC
  Administered 2017-04-01: 10 mg

## 2017-04-01 NOTE — Progress Notes (Signed)
Subjective:     Patient ID: Wanda Hicks, female   DOB: 10-19-1961, 56 y.o.   MRN: [id number]  HPI patient states the back of my heel has become very sore over the last 3 months and it's making it hard to wear shoe gear or be ambulatory   Review of Systems     Objective:   Physical Exam Neurovascular status intact muscle strength adequate with quite a bit of discomfort at the insertion of the Achilles tendon on the posterior right heel lateral side. I did not note any tendon dysfunction and medial and central are functioning well with no discomfort    Assessment:     Achilles tendinitis that has flared for the last 3 months with history of this problem    Plan:     H&P x-ray reviewed condition discussed. Careful lateral injection administered 3 mg Dexon some Kenalog 5 mg Xylocaine after explaining risk of rupture. I then dispensed short air fracture walker with instructions on usage for the next 3-4 weeks and ice therapy and if symptoms persist she will be seen back  X-rays indicate spur the posterior right heel of a significant nature

## 2017-04-22 ENCOUNTER — Ambulatory Visit: Payer: BC Managed Care – PPO | Admitting: Podiatry

## 2017-05-07 ENCOUNTER — Encounter: Payer: Self-pay | Admitting: Podiatry

## 2017-05-07 ENCOUNTER — Ambulatory Visit (INDEPENDENT_AMBULATORY_CARE_PROVIDER_SITE_OTHER): Payer: BC Managed Care – PPO | Admitting: Podiatry

## 2017-05-07 DIAGNOSIS — M7661 Achilles tendinitis, right leg: Secondary | ICD-10-CM

## 2017-05-07 MED ORDER — TRIAMCINOLONE ACETONIDE 10 MG/ML IJ SUSP
10.0000 mg | Freq: Once | INTRAMUSCULAR | Status: AC
  Administered 2017-05-07: 10 mg

## 2017-05-07 NOTE — Patient Instructions (Addendum)

## 2017-05-09 NOTE — Progress Notes (Signed)
Subjective:    Patient ID: Wanda RunningSuzanne M Hicks, female   DOB: 56 y.o.   MRN: [id number]   HPI patient states that my Achilles tendinitis has improved but it still really hurts    ROS      Objective:  Physical Exam neurovascular status intact muscle strength adequate range of motion within normal limits with patient's Achilles tendon doing quite a bit better at the insertion with pain still present upon significant movement but it is now on the medial side instead of the lateral side     Assessment:   Chronic Achilles tendinitis present      Plan:     Reviewed condition and explained the possibility for a medial injection since we did the lateral last time and I did explain the chances for rupture associated with this and she wants the procedure and today I did a careful injection medial side 3 mg [place] some Kenalog 5 mill grams Xylocaine and instructed on her wearing the boot for the next week at least and longer if improved

## 2017-05-28 ENCOUNTER — Ambulatory Visit (INDEPENDENT_AMBULATORY_CARE_PROVIDER_SITE_OTHER): Payer: BC Managed Care – PPO | Admitting: Podiatry

## 2017-05-28 ENCOUNTER — Encounter: Payer: Self-pay | Admitting: Podiatry

## 2017-05-28 DIAGNOSIS — M7661 Achilles tendinitis, right leg: Secondary | ICD-10-CM

## 2017-05-29 NOTE — Progress Notes (Signed)
Subjective:    Patient ID: Wanda Hicks, female   DOB: 56 y.o.   MRN: [id number]   HPI patient presents stating she is continuing to have pain in the right posterior heel but states it's slightly better with the boot and the previous injections    ROS      Objective:  Physical Exam neurovascular status intact with patient found to have quite a bit of discomfort in the posterior aspect of the right heel at the insertional point tendon into the calcaneus mostly on the medial side mildly on the lateral side. Also has mild equinus condition     Assessment:   Achilles tendinitis right that continues to be a problem      Plan:   H&P condition reviewed and at this point I recommended continued boot usage he'll lift usage ice therapy and if symptoms do not get better she will need to initiate shockwave therapy and I reviewed that with her. Patient is to be seen back depending on her response to continued conservative care

## 2017-06-23 ENCOUNTER — Ambulatory Visit: Payer: Self-pay | Admitting: Podiatry

## 2017-06-23 DIAGNOSIS — M7661 Achilles tendinitis, right leg: Secondary | ICD-10-CM

## 2017-06-23 DIAGNOSIS — B351 Tinea unguium: Secondary | ICD-10-CM

## 2017-06-23 NOTE — Progress Notes (Signed)
Patient presents with ongoing pain in her Right posterior heel, she has tried injections and offloading with a boot but the pain continues.    Pain on palpation to posterior heel n her right foot  ESWT administered to area for 4.5 joules. She tolerated the procedure well and is to follow up next week. Advised on boot usage and avoidance of any extra NSAIDs

## 2017-07-03 ENCOUNTER — Ambulatory Visit (INDEPENDENT_AMBULATORY_CARE_PROVIDER_SITE_OTHER): Payer: Self-pay | Admitting: Podiatry

## 2017-07-03 DIAGNOSIS — M7661 Achilles tendinitis, right leg: Secondary | ICD-10-CM

## 2017-07-03 NOTE — Progress Notes (Signed)
Patient presents with ongoing pain in her Right posterior heel, she has tried injections and offloading with a boot but the pain continues.    Pain on palpation to posterior heel n her right foot  EPAT administered to area. She tolerated the procedure well and is to follow up next week. Advised on boot usage and avoidance of any extra NSAIDs, she will follow up on 7/30 for another treatment. We hope to have the ESWT machine back at that point. No charge for todays treatment

## 2017-07-13 ENCOUNTER — Ambulatory Visit (INDEPENDENT_AMBULATORY_CARE_PROVIDER_SITE_OTHER): Payer: Self-pay | Admitting: Podiatry

## 2017-07-13 DIAGNOSIS — M7661 Achilles tendinitis, right leg: Secondary | ICD-10-CM

## 2017-07-13 DIAGNOSIS — M722 Plantar fascial fibromatosis: Secondary | ICD-10-CM

## 2017-07-13 NOTE — Progress Notes (Signed)
Patient presents with ongoing pain in her Right posterior heel, she has tried injections and offloading with a boot but the pain continues. She states that she has noticed a little improvement since last treatment   Pain on palpation to posterior heel n her right foot  ESWT administered to area for 8 joules. She tolerated the procedure well and is to follow up next week. Advised on boot usage and avoidance of any extra NSAIDs

## 2017-07-21 ENCOUNTER — Ambulatory Visit: Payer: Self-pay | Admitting: Podiatry

## 2017-07-21 DIAGNOSIS — M722 Plantar fascial fibromatosis: Secondary | ICD-10-CM

## 2017-07-21 DIAGNOSIS — M7661 Achilles tendinitis, right leg: Secondary | ICD-10-CM

## 2017-07-21 NOTE — Progress Notes (Signed)
Patient presents with ongoing pain in her Right posterior heel, she has tried injections and offloading with a boot but the pain continues. She states that she has noticed a little improvement since last treatment   Pain on palpation to posterior heel n her right foot  ESWT administered to area for 8 joules. She tolerated the procedure well and is to follow up in 4 weeks.  Advised on boot usage and avoidance of any extra NSAIDs

## 2017-08-31 ENCOUNTER — Ambulatory Visit (INDEPENDENT_AMBULATORY_CARE_PROVIDER_SITE_OTHER): Payer: BC Managed Care – PPO | Admitting: Podiatry

## 2017-08-31 DIAGNOSIS — M722 Plantar fascial fibromatosis: Secondary | ICD-10-CM

## 2017-08-31 DIAGNOSIS — M7661 Achilles tendinitis, right leg: Secondary | ICD-10-CM

## 2017-08-31 NOTE — Progress Notes (Signed)
Patient presents with ongoing pain in her Right posterior heel, she has tried injections and offloading with a boot but the pain continues. She states that she has noticed a little improvement since last treatment   Pain on palpation to posterior heel n her right foot  ESWT administered to area for 7 joules. She is to follow up if needed with Dr Charlsie Merles in 6 weeks

## 2017-10-09 ENCOUNTER — Other Ambulatory Visit: Payer: Self-pay | Admitting: Cardiovascular Disease

## 2017-10-14 ENCOUNTER — Ambulatory Visit: Payer: BC Managed Care – PPO | Admitting: Podiatry

## 2018-02-09 ENCOUNTER — Other Ambulatory Visit: Payer: Self-pay | Admitting: Neurosurgery

## 2018-02-09 DIAGNOSIS — M5412 Radiculopathy, cervical region: Secondary | ICD-10-CM

## 2018-02-21 ENCOUNTER — Ambulatory Visit
Admission: RE | Admit: 2018-02-21 | Discharge: 2018-02-21 | Disposition: A | Discharge status: Home / Self Care | Payer: BC Managed Care – PPO | Source: Ambulatory Visit | Attending: Neurosurgery | Admitting: Neurosurgery

## 2018-02-21 DIAGNOSIS — M5412 Radiculopathy, cervical region: Secondary | ICD-10-CM

## 2018-02-24 ENCOUNTER — Other Ambulatory Visit: Payer: Self-pay | Admitting: Family Medicine

## 2018-02-24 DIAGNOSIS — H349 Unspecified retinal vascular occlusion: Secondary | ICD-10-CM

## 2018-02-25 ENCOUNTER — Other Ambulatory Visit (HOSPITAL_COMMUNITY): Payer: Self-pay | Admitting: Internal Medicine

## 2018-02-25 ENCOUNTER — Ambulatory Visit (HOSPITAL_COMMUNITY)
Admission: RE | Admit: 2018-02-25 | Discharge: 2018-02-25 | Disposition: A | Discharge status: Home / Self Care | Payer: BC Managed Care – PPO | Source: Ambulatory Visit | Attending: Vascular Surgery | Admitting: Vascular Surgery

## 2018-02-25 DIAGNOSIS — H539 Unspecified visual disturbance: Secondary | ICD-10-CM | POA: Insufficient documentation

## 2018-03-01 ENCOUNTER — Other Ambulatory Visit: Payer: Self-pay

## 2018-03-01 ENCOUNTER — Ambulatory Visit (HOSPITAL_COMMUNITY): Payer: BC Managed Care – PPO | Attending: Cardiovascular Disease

## 2018-03-01 DIAGNOSIS — H349 Unspecified retinal vascular occlusion: Secondary | ICD-10-CM | POA: Diagnosis not present

## 2018-03-01 DIAGNOSIS — E785 Hyperlipidemia, unspecified: Secondary | ICD-10-CM | POA: Insufficient documentation

## 2018-03-01 DIAGNOSIS — I10 Essential (primary) hypertension: Secondary | ICD-10-CM | POA: Insufficient documentation

## 2018-03-01 DIAGNOSIS — E119 Type 2 diabetes mellitus without complications: Secondary | ICD-10-CM | POA: Diagnosis not present

## 2018-03-15 NOTE — Progress Notes (Signed)
Cardiology Office Note   Date:  03/16/2018   ID:  Wanda Hicks, DOB 01-16-1961, MRN [id number]  PCP:  Geoffry ParadiseAronson, Richard, MD  Cardiologist: Dr. Royann Shiversroitoru  Chief Complaint  Patient presents with  . Follow-up  . Palpitations     History of Present Illness: Wanda Hicks is a 57 y.o. female who presents for ongoing assessment and management chronic diastolic heart failure in the setting of hypertension; hyperlipidemia, type 1 diabetes, no known coronary artery disease.  Patient was last seen by Dr. Royann Shiversroitoru on 07/31/2016, she had recently undergone uncomplicated lumbar spinal fusion 3 weeks prior.  At the time of that office visit she was doing very well had no evidence of decompensated heart failure, blood pressure was well controlled, and she was asymptomatic.  She has normal left ventricular systolic function and mild aortic valve sclerosis by echo in 2015. Normal nuclear stress test in 2015.  She has had a follow-up echocardiogram and carotid Doppler studies completed in March 2019.  This was ordered by primary care physician as she was found to have vision disturbances.  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 She has been on statin therapy several years.  She is here today to discuss results of echo and carotid studies.  Her vision has improved.  Past Medical History:  Diagnosis Date  . Arthritis   . Bronchitis   . Chronic diastolic heart failure (HCC) 10/19/2014  . Diabetes mellitus without complication (HCC)    Type 1; pt has insulin pump  . Family history of adverse reaction to anesthesia    Patients mother has N/V  . GERD (gastroesophageal reflux disease)   . Heart murmur   . Hypertension   . Pneumonia    hx of  . Positive PPD   . Shortness of breath dyspnea    uses Albuterol inhaler PRN    Past Surgical History:  Procedure Laterality Date  . CARPAL TUNNEL RELEASE Bilateral   . CERVICAL DISCECTOMY     X 2  . CESAREAN SECTION    .  COLONOSCOPY    . EYE SURGERY     as a child  . MAXIMUM ACCESS (MAS)POSTERIOR LUMBAR INTERBODY FUSION (PLIF) 1 LEVEL N/A 07/10/2016   Procedure: Lumbar four-five Maximum access posterior lumbar interbody fusion with resection of synovial cyst;  Surgeon: Maeola HarmanJoseph Stern, MD;  Location: [facility];  Service: Neurosurgery;  Laterality: N/A;  . TRIGGER FINGER RELEASE Bilateral   . ULNAR NERVE REPAIR Bilateral      Current Outpatient Medications  Medication Sig Dispense Refill  . ACCU-CHEK AVIVA PLUS test strip   0  . ACCU-CHEK FASTCLIX LANCETS MISC     . acetaminophen (TYLENOL) 500 MG tablet Take 1,000 mg by mouth 2 (two) times daily.    Marland Kitchen. ADVAIR DISKUS 100-50 MCG/DOSE AEPB Inhale 1 puff into the lungs daily as needed.     Marland Kitchen. albuterol (PROVENTIL HFA;VENTOLIN HFA) 108 (90 Base) MCG/ACT inhaler Inhale 1 puff into the lungs every 6 (six) hours as needed for wheezing or shortness of breath.    Marland Kitchen. aspirin 325 MG tablet Take 325 mg by mouth daily.    . Calcium Carb-Cholecalciferol (CALCIUM 600/VITAMIN D3) 600-800 MG-UNIT TABS Take 1 tablet by mouth daily.    Marland Kitchen. etodolac (LODINE) 400 MG tablet     . fluticasone (FLONASE) 50 MCG/ACT nasal spray Place 1 spray into both nostrils daily.    . Insulin Human (INSULIN PUMP) SOLN Inject into the  skin.    Marland Kitchen JARDIANCE 10 MG TABS tablet Take 10 mg by mouth daily.  0  . KRILL OIL PO Take 1 Dose by mouth daily.    Marland Kitchen levothyroxine (SYNTHROID, LEVOTHROID) 112 MCG tablet Take 1 tablet by mouth daily.    Marland Kitchen losartan (COZAAR) 50 MG tablet TAKE 1 TABLET DAILY 90 tablet 3  . Multiple Vitamin (MULTIVITAMIN) tablet Take 1 tablet by mouth daily.    . Multiple Vitamins-Minerals (ICAPS AREDS 2) CAPS Take by mouth.    Marland Kitchen NOVOLOG 100 UNIT/ML injection Inject into the skin daily.     Marland Kitchen PRILOSEC OTC 20 MG tablet Take 1 tablet by mouth daily.    . rosuvastatin (CRESTOR) 20 MG tablet Take 1 tablet (20 mg total) by mouth daily. 30 tablet 0  . tiZANidine (ZANAFLEX) 2 MG tablet     .  traMADol (ULTRAM) 50 MG tablet Take 1 tablet (50 mg total) by mouth every 6 (six) hours as needed for moderate pain. 60 tablet 0  . metoprolol succinate (TOPROL XL) 25 MG 24 hr tablet Take 0.5 tablets (12.5 mg total) by mouth daily. 15 tablet 3   No current facility-administered medications for this visit.     Allergies:   No known allergies and Latex    Social History:  The patient  reports that she has never smoked. She has never used smokeless tobacco. She reports that she does not drink alcohol or use drugs.   Family History:  The patient's family history includes Alcoholism in her father; Cancer in her maternal grandfather; Hypertension in her mother; Stroke in her paternal grandfather.    ROS: All other systems are reviewed and negative. Unless otherwise mentioned in H&P    PHYSICAL EXAM: VS:  BP 130/70   Pulse 74   Ht 5\' 4"  (1.626 m)   Wt 209 lb (94.8 kg)   BMI 35.87 kg/m  , BMI Body mass index is 35.87 kg/m. GEN: Well nourished, well developed, in no acute distress  HEENT: normal  Neck: no JVD, carotid bruits, or masses Cardiac: RRR; 1/6 systolic murmur, rubs, or gallops,no edema  Respiratory:  Clear to auscultation bilaterally, normal work of breathing GI: soft, nontender, nondistended, + BS MS: no deformity or atrophy  Skin: warm and dry, no rash Neuro:  Strength and sensation are intact Psych: euthymic mood, full affect   EKG: Normal sinus rhythm, mild LVH with repolarization abnormality.  Heart rate of 74 bpm.  Recent Labs: No results found for requested labs within last 8760 hours.    Lipid Panel No results found for: CHOL, TRIG, HDL, CHOLHDL, VLDL, LDLCALC, LDLDIRECT    Wt Readings from Last 3 Encounters:  03/16/18 209 lb (94.8 kg)  07/31/16 212 lb (96.2 kg)  07/10/16 208 lb (94.3 kg)      Other studies Reviewed:  Echocardiogram 03/01/2018 Left ventricle: The cavity size was normal. Wall thickness was   increased in a pattern of mild LVH.  Systolic function was   vigorous. The estimated ejection fraction was in the range of 75%   to 80%. There was dynamic obstruction during Valsalvaat an   indeterminate location, with mid-cavity obliteration, a peak   velocity of 397 cm/sec, and a peak gradient of 63 mm Hg. Wall   motion was normal; there were no regional wall motion   abnormalities. Doppler parameters are consistent with abnormal   left ventricular relaxation (grade 1 diastolic dysfunction). - Mitral valve: Mildly calcified annulus. Mean gradient (D): 4 mm  Hg. Peak gradient (D): 9 mm Hg. Valve area by pressure half-time:   2.12 cm^2.  Carotid Ultrasound: 02/25/2018 Final Interpretation: Right Carotid: There is no evidence of stenosis in the right ICA.  Left Carotid: There is no evidence of stenosis in the left ICA.  Vertebrals: Bilateral vertebral arteries demonstrate antegrade flow  ASSESSMENT AND PLAN:  1.  Hyperdynamic left ventricular systolic function: This was found to recent echocardiogram with an EF of 75% 80%.  There is total obliteration of the left ventricle during systole.  I will start her on metoprolol XL 12.5 mg in the afternoon with option to titrate up as she tolerates.  She is done in an effort to slow her heart rate and allow her to have more cardiac output.  She does feel frequent palpitations and sometimes lightheadedness.  Hopefully this will assist in improving the symptoms.  I have explained her echo and carotid artery study results.  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  I have also printed out these results for her.  She verbalizes understanding.  2. Hollenhorst Plaque: Continue statin therapy.  3.  Type 1 diabetes: Insulin dependent. Followed by PCP.   Current medicines are reviewed at length with the patient today.    Labs/ tests ordered today include: None  Bettey Mare. Liborio Nixon, ANP, AACC   03/16/2018 6:00 PM    [facility] [address], [place], [place]  [address] Phone: [phone]; Fax: [phone]

## 2018-03-16 ENCOUNTER — Ambulatory Visit: Payer: BC Managed Care – PPO | Admitting: Adult Health

## 2018-03-16 ENCOUNTER — Encounter: Payer: Self-pay | Admitting: Adult Health

## 2018-03-16 VITALS — BP 130/70 | HR 74 | Ht 64.0 in | Wt 209.0 lb

## 2018-03-16 DIAGNOSIS — H539 Unspecified visual disturbance: Secondary | ICD-10-CM

## 2018-03-16 DIAGNOSIS — I5032 Chronic diastolic (congestive) heart failure: Secondary | ICD-10-CM | POA: Diagnosis not present

## 2018-03-16 DIAGNOSIS — E7800 Pure hypercholesterolemia, unspecified: Secondary | ICD-10-CM | POA: Diagnosis not present

## 2018-03-16 MED ORDER — METOPROLOL SUCCINATE ER 25 MG PO TB24: 12.5000 mg | ORAL_TABLET | Freq: Every day | ORAL | 3 refills | Status: DC

## 2018-03-16 NOTE — Patient Instructions (Signed)
Medication Instructions:  START METOPROLOL SUCCINATE XL 12.5MG  DAILY AT NOON  If you need a refill on your cardiac medications before your next appointment, please call your pharmacy.  Follow-Up: Your physician wants you to follow-up in: 1 MONTH WITH DR CROITORU -OR- KATHRYN LAWRENCE (NURSE PRACTIONIER), DNP.    Thank you for choosing [facility] at [facility]!!

## 2018-03-16 NOTE — Progress Notes (Signed)
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 [facility]. The beta blocker is probably a good idea, but I would interpretthat echo as showing relative hypovolemia, possibly due to the diuretic effect of Jardiance. She needs to be reminded to drink extra water to compensate for that. Thanks [employer]

## 2018-04-13 NOTE — Progress Notes (Signed)
Cardiology Office Note   Date:  04/14/2018   ID:  Wanda Hicks, DOB [birth year], MRN [id number]  PCP:  Geoffry Paradise, MD  Cardiologist:  Dr. Royann Shivers   Chief Complaint  Patient presents with  . Hypertension  . Congestive Heart Failure     History of Present Illness: Wanda Hicks is a 57 y.o. female who presents for ongoing assessment and management of hypertension, chronic diastolic CHF, and hyperlipidemia, with known history of Type II diabetes. Was last seen in the office on 03/16/2018 after having had a echocardiogram and stress test to discuss results. She was found to have hyperdynamic LV fx. She was started on metoprolol 12.5 mg in the afternoon and titrate up as she tolerates. She is here for follow up concerning response to medications.   She comes today more hypertensive than she was on last visit. She has been working all day and but is not stressed. She has been taking her medications as directed.   Past Medical History:  Diagnosis Date  . Arthritis   . Bronchitis   . Chronic diastolic heart failure (HCC) 10/19/2014  . Diabetes mellitus without complication (HCC)    Type 1; pt has insulin pump  . Family history of adverse reaction to anesthesia    Patients mother has N/V  . GERD (gastroesophageal reflux disease)   . Heart murmur   . Hypertension   . Pneumonia    hx of  . Positive PPD   . Shortness of breath dyspnea    uses Albuterol inhaler PRN    Past Surgical History:  Procedure Laterality Date  . CARPAL TUNNEL RELEASE Bilateral   . CERVICAL DISCECTOMY     X 2  . CESAREAN SECTION    . COLONOSCOPY    . EYE SURGERY     as a child  . MAXIMUM ACCESS (MAS)POSTERIOR LUMBAR INTERBODY FUSION (PLIF) 1 LEVEL N/A 07/10/2016   Procedure: Lumbar four-five Maximum access posterior lumbar interbody fusion with resection of synovial cyst;  Surgeon: Maeola Harman, MD;  Location: [facility];  Service: Neurosurgery;  Laterality: N/A;  . TRIGGER FINGER RELEASE Bilateral     . ULNAR NERVE REPAIR Bilateral      Current Outpatient Medications  Medication Sig Dispense Refill  . ACCU-CHEK AVIVA PLUS test strip   0  . ACCU-CHEK FASTCLIX LANCETS MISC     . acetaminophen (TYLENOL) 500 MG tablet Take 1,000 mg by mouth 2 (two) times daily.    Marland Kitchen ADVAIR DISKUS 100-50 MCG/DOSE AEPB Inhale 1 puff into the lungs daily as needed.     Marland Kitchen albuterol (PROVENTIL HFA;VENTOLIN HFA) 108 (90 Base) MCG/ACT inhaler Inhale 1 puff into the lungs every 6 (six) hours as needed for wheezing or shortness of breath.    Marland Kitchen aspirin 325 MG tablet Take 325 mg by mouth daily.    . Calcium Carb-Cholecalciferol (CALCIUM 600/VITAMIN D3) 600-800 MG-UNIT TABS Take 1 tablet by mouth daily.    Marland Kitchen etodolac (LODINE) 400 MG tablet     . fluticasone (FLONASE) 50 MCG/ACT nasal spray Place 1 spray into both nostrils daily.    . Insulin Human (INSULIN PUMP) SOLN Inject into the skin.    Marland Kitchen JARDIANCE 10 MG TABS tablet Take 10 mg by mouth daily.  0  . KRILL OIL PO Take 1 Dose by mouth daily.    Marland Kitchen levothyroxine (SYNTHROID, LEVOTHROID) 112 MCG tablet Take 1 tablet by mouth daily.    Marland Kitchen losartan (COZAAR) 50 MG tablet TAKE 1  TABLET DAILY 90 tablet 3  . metoprolol succinate (TOPROL XL) 25 MG 24 hr tablet Take 0.5 tablets (12.5 mg total) by mouth daily. 15 tablet 3  . Multiple Vitamin (MULTIVITAMIN) tablet Take 1 tablet by mouth daily.    . Multiple Vitamins-Minerals (ICAPS AREDS 2) CAPS Take by mouth.    Marland Kitchen NOVOLOG 100 UNIT/ML injection Inject into the skin daily.     Marland Kitchen PRILOSEC OTC 20 MG tablet Take 1 tablet by mouth daily.    . rosuvastatin (CRESTOR) 20 MG tablet Take 1 tablet (20 mg total) by mouth daily. 30 tablet 0  . tiZANidine (ZANAFLEX) 2 MG tablet     . traMADol (ULTRAM) 50 MG tablet Take 1 tablet (50 mg total) by mouth every 6 (six) hours as needed for moderate pain. 60 tablet 0   No current facility-administered medications for this visit.     Allergies:   No known allergies and Latex    Social History:   The patient  reports that she has never smoked. She has never used smokeless tobacco. She reports that she does not drink alcohol or use drugs.   Family History:  The patient's family history includes Alcoholism in her father; Cancer in her maternal grandfather; Hypertension in her mother; Stroke in her paternal grandfather.    ROS: All other systems are reviewed and negative. Unless otherwise mentioned in H&P    PHYSICAL EXAM: VS:  BP (!) 152/70   Pulse 78   Ht  (1.626 m)   Wt 210 lb 6.4 oz (95.4 kg)   BMI 36.12 kg/m  , BMI Body mass index is 36.12 kg/m. GEN: Well nourished, well developed, in no acute distress  HEENT: normal  Neck: no JVD, carotid bruits, or masses Cardiac: RRR; no murmurs, rubs, or gallops,no edema  Respiratory:  Clear to auscultation bilaterally, normal work of breathing GI: soft, nontender, nondistended, + BS MS: no deformity or atrophy  Skin: warm and dry, no rash Neuro:  Strength and sensation are intact Psych: euthymic mood, full affect   EKG: Not completed this office visit.   Recent Labs: No results found for requested labs within last 8760 hours.    Lipid Panel No results found for: CHOL, TRIG, HDL, CHOLHDL, VLDL, LDLCALC, LDLDIRECT    Wt Readings from Last 3 Encounters:  04/14/18 210 lb 6.4 oz (95.4 kg)  03/16/18 209 lb (94.8 kg)  07/31/16 212 lb (96.2 kg)      Other studies Reviewed:  Echo 03/01/2018 Left ventricle: The cavity size was normal. Wall thickness was   increased in a pattern of mild LVH. Systolic function was   vigorous. The estimated ejection fraction was in the range of 75%   to 80%. There was dynamic obstruction during Valsalvaat an   indeterminate location, with mid-cavity obliteration, a peak   velocity of 397 cm/sec, and a peak gradient of 63 mm Hg. Wall   motion was normal; there were no regional wall motion   abnormalities. Doppler parameters are consistent with abnormal   left ventricular relaxation (grade  1 diastolic dysfunction). - Mitral valve: Mildly calcified annulus. Mean gradient (D): 4 mm   Hg. Peak gradient (D): 9 mm Hg. Valve area by pressure half-time:   2.12 cm^2.  NM stress test 10/03/2014  Exercise Capacity:  Fair exercise capacity. BP Response:  Normal blood pressure response. Clinical Symptoms:  There is dyspnea. ECG Impression:  Significant ST abnormalities consistent with ischemia. Comparison with Prior Nuclear Study: No previous nuclear  study performed  Overall Impression:  Low risk stress nuclear study with ECG changes; however perfusion is normal with no ischemia or infarction.  Carotid Doppler US 02/25/2018  Final Interpretation: Right Carotid: There is no evidence of stenosis in the right ICA.  Left Carotid: There is no evidence of stenosis in the left ICA.  Vertebrals: Bilateral vertebral arteries demonstrate antegrade flow.  ASSESSMENT AND PLAN:  1. Hypertension: Not currently controlled despite addition of metoprolol 12.5 mg. She admits to eating out twice a week and adding "low salt" to her foods. Due to diabetes history will need to get her BP much better. I have rechecked It in the exam room manually, 158/88. She denies medical non-compliance. She takes the losartan in the afternoon around lunchtime.  She will increase her losartan to 75 mg daily and come back in one week for BP check. She states it has been rising some at home as well. Continue metoprolol. If BP continues to be consistently elevated despite medication adjustment, or consider  addition of HCTZ (change to Hyzaar).may consider further ischemic testing.   2. Chronic Diastolic CHF: No evidence of fluid overload this visit. Weight is only up 1 lb.   3.Diabetes: Followed by PCP.   4. Cervical Spine DJD with Bone Spur: Getting injection to neck by orthopedic physician on 04/29/2017.      Current medicines are reviewed at length with the patient today.    Labs/ tests ordered today include:  None   Bettey Mare. Liborio Nixon, ANP, AACC   04/14/2018 3:26 PM    [facility] [address], [place], [place] [address] Phone: [phone]; Fax: [phone]

## 2018-04-14 ENCOUNTER — Encounter: Payer: Self-pay | Admitting: Adult Health

## 2018-04-14 ENCOUNTER — Ambulatory Visit: Payer: BC Managed Care – PPO | Admitting: Adult Health

## 2018-04-14 VITALS — BP 152/70 | HR 78 | Ht 64.0 in | Wt 210.4 lb

## 2018-04-14 DIAGNOSIS — I10 Essential (primary) hypertension: Secondary | ICD-10-CM

## 2018-04-14 DIAGNOSIS — I5032 Chronic diastolic (congestive) heart failure: Secondary | ICD-10-CM | POA: Diagnosis not present

## 2018-04-14 MED ORDER — LOSARTAN POTASSIUM 50 MG PO TABS: 75.0000 mg | ORAL_TABLET | Freq: Every day | ORAL | 0 refills | Status: DC

## 2018-04-14 MED ORDER — METOPROLOL SUCCINATE ER 25 MG PO TB24: 12.5000 mg | ORAL_TABLET | Freq: Every day | ORAL | 1 refills | Status: DC

## 2018-04-14 NOTE — Patient Instructions (Signed)
Medication Instructions:  INCREASE LOSARTAN  DAILY (1-1/2 TAB) If you need a refill on your cardiac medications before your next appointment, please call your pharmacy.  Special Instructions:  BP CHECK IN 1 WEEK WITH PARMACIST  Follow-Up: Your physician wants you to follow-up in: 3 MONTHS WITH DR CROITORU -OR- KATHRYN LAWRENCE (NURSE PRACTIONIER), DNP,AACC IF PRIMARY CARDIOLOGIST IS UNAVAILABLE.    Thank you for choosing [facility] at [facility]!!

## 2018-04-15 NOTE — Progress Notes (Signed)
Another option may be to switch metoprolol to carvedilol - better BP reduction (and possible fringe benefit with DM). Thanks [employer]

## 2018-04-21 ENCOUNTER — Ambulatory Visit (INDEPENDENT_AMBULATORY_CARE_PROVIDER_SITE_OTHER): Payer: Self-pay

## 2018-04-21 VITALS — BP 142/58

## 2018-04-21 DIAGNOSIS — I10 Essential (primary) hypertension: Secondary | ICD-10-CM

## 2018-04-21 MED ORDER — HYDROCHLOROTHIAZIDE 12.5 MG PO CAPS: 12.5000 mg | ORAL_CAPSULE | Freq: Every day | ORAL | 3 refills | Status: DC

## 2018-04-21 NOTE — Progress Notes (Signed)
Pt came in as a nurse visit for recheck of BP. BP was taking manually in left arm 142/58. Per Dr. Joni Reining, NP pt is to Start HTCZ 12.5 mg daily.

## 2018-04-21 NOTE — Patient Instructions (Signed)
Medication Instructions:  Start- Hydrochlorothiazide 12.5 mg daily    Any Other Special Instructions Will Be Listed Below (If Applicable). Continue to keep log of BP reading. Contact office if BP doesn't improve.    If you need a refill on your cardiac medications before your next appointment, please call your pharmacy.

## 2018-06-10 ENCOUNTER — Ambulatory Visit: Payer: BC Managed Care – PPO | Admitting: Podiatry

## 2018-06-10 ENCOUNTER — Encounter: Payer: Self-pay | Admitting: Podiatry

## 2018-06-10 DIAGNOSIS — M722 Plantar fascial fibromatosis: Secondary | ICD-10-CM

## 2018-06-10 DIAGNOSIS — L84 Corns and callosities: Secondary | ICD-10-CM | POA: Diagnosis not present

## 2018-06-11 NOTE — Progress Notes (Signed)
Subjective:   Patient ID: Wanda Hicks, female   DOB: 57 y.o.   MRN: [id number]   HPI Patient presents stating she has a lesion underneath his metatarsal that is been sore and she has had plantar fasciitis   ROS      Objective:  Physical Exam  Neurovascular status intact with inflammation around the fifth metatarsal left with fluid buildup and is noted to have mild discomfort in the plantar fascia     Assessment:  Inflammatory keratotic lesion along with plantar fasciitis     Plan:  H&P condition reviewed debridement accomplished discussed wider shoes supportive type shoes and stretching exercises.  Patient will be seen back for us to recheck again in the next several weeks or earlier if any issues should occur

## 2018-06-22 ENCOUNTER — Telehealth: Payer: Self-pay | Admitting: Adult Health

## 2018-06-22 NOTE — Telephone Encounter (Signed)
New Message   Pt is only wanting the [facility] [address] [facility] [place], [place] - [id number] pharmacy on her list and the rest taking off

## 2018-06-22 NOTE — Telephone Encounter (Signed)
Pharmacy list updated

## 2018-06-28 ENCOUNTER — Ambulatory Visit: Payer: Self-pay

## 2018-06-28 DIAGNOSIS — M722 Plantar fascial fibromatosis: Secondary | ICD-10-CM

## 2018-06-28 DIAGNOSIS — M79676 Pain in unspecified toe(s): Secondary | ICD-10-CM

## 2018-07-01 NOTE — Progress Notes (Signed)
Patient presents with ongoing pain in her Right posterior heel, she has tried injections and offloading with a boot but the pain continues.   Pain on palpation to posterior heel n her right foot  ESWT administered to area for 5 joules. She is to follow up next week for a second treatment

## 2018-07-05 ENCOUNTER — Ambulatory Visit: Payer: Self-pay

## 2018-07-05 DIAGNOSIS — M79676 Pain in unspecified toe(s): Secondary | ICD-10-CM

## 2018-07-05 DIAGNOSIS — M7661 Achilles tendinitis, right leg: Secondary | ICD-10-CM

## 2018-07-05 DIAGNOSIS — M722 Plantar fascial fibromatosis: Secondary | ICD-10-CM

## 2018-07-05 NOTE — Patient Instructions (Signed)

## 2018-07-12 ENCOUNTER — Ambulatory Visit: Payer: Self-pay | Admitting: Podiatry

## 2018-07-12 DIAGNOSIS — M722 Plantar fascial fibromatosis: Secondary | ICD-10-CM

## 2018-07-12 DIAGNOSIS — M79676 Pain in unspecified toe(s): Secondary | ICD-10-CM

## 2018-07-13 NOTE — Progress Notes (Signed)
Patient presents with ongoing pain in her Right posterior heel, she has tried injections and offloading with a boot but the pain continues.  She says that she has had a significant improvement in pain since her last treatment.  Mild pain on palpation to posterior heel n her right foot.  ESWT administered to the area for 6.6 J and tolerated well.  Patient follow-up next week for her third treatment.  Orthotics were dispensed today with instructions on use and she is to follow-up with Raiford Nobleick about any concerns with her orthotics.

## 2018-07-14 NOTE — Progress Notes (Signed)
Patient presents with ongoing pain in her Right posterior heel, she has tried injections and offloading with a boot but the pain continues.  She says that she has had a significant improvement in pain since her last treatment.  She feels like the treatment has worked very well so far.  Mild pain on palpation to posterior heel n her right foot.  ESWT administered to the area for  7.6J and tolerated well.  Patient is to follow-up as needed if symptoms worsen or fail to improve.

## 2018-07-26 ENCOUNTER — Ambulatory Visit: Payer: Self-pay

## 2018-07-26 ENCOUNTER — Ambulatory Visit: Payer: BC Managed Care – PPO | Admitting: Orthotics

## 2018-07-26 ENCOUNTER — Other Ambulatory Visit: Payer: BC Managed Care – PPO

## 2018-07-26 DIAGNOSIS — M722 Plantar fascial fibromatosis: Secondary | ICD-10-CM

## 2018-07-26 DIAGNOSIS — L84 Corns and callosities: Secondary | ICD-10-CM

## 2018-07-26 DIAGNOSIS — M79676 Pain in unspecified toe(s): Secondary | ICD-10-CM

## 2018-07-26 NOTE — Progress Notes (Signed)
Heated f/o to lower arch...seem pleased.

## 2018-08-01 ENCOUNTER — Encounter: Payer: Self-pay | Admitting: Cardiovascular Disease

## 2018-08-02 ENCOUNTER — Ambulatory Visit: Payer: BC Managed Care – PPO | Admitting: Cardiovascular Disease

## 2018-08-02 ENCOUNTER — Encounter: Payer: Self-pay | Admitting: Cardiovascular Disease

## 2018-08-02 VITALS — BP 138/66 | HR 78 | Ht 64.0 in | Wt 212.4 lb

## 2018-08-02 DIAGNOSIS — H34219 Partial retinal artery occlusion, unspecified eye: Secondary | ICD-10-CM

## 2018-08-02 DIAGNOSIS — I358 Other nonrheumatic aortic valve disorders: Secondary | ICD-10-CM

## 2018-08-02 DIAGNOSIS — I5032 Chronic diastolic (congestive) heart failure: Secondary | ICD-10-CM

## 2018-08-02 DIAGNOSIS — E669 Obesity, unspecified: Secondary | ICD-10-CM

## 2018-08-02 DIAGNOSIS — I10 Essential (primary) hypertension: Secondary | ICD-10-CM | POA: Diagnosis not present

## 2018-08-02 DIAGNOSIS — E7800 Pure hypercholesterolemia, unspecified: Secondary | ICD-10-CM | POA: Diagnosis not present

## 2018-08-02 DIAGNOSIS — E1169 Type 2 diabetes mellitus with other specified complication: Secondary | ICD-10-CM

## 2018-08-02 NOTE — Progress Notes (Signed)
Cardiology Office Note    Date:  08/03/2018   ID:  Wanda Hicks, Park MeoDOB [birth year], MRN [id number]  PCP:  Geoffry ParadiseAronson, Richard, MD  Cardiologist:   Thurmon FairMihai Indiyah Paone, MD   Chief Complaint  Patient presents with  . Congestive Heart Failure    History of Present Illness:  Wanda RunningSuzanne M Hicks is a 57 y.o. female with a history of chronic diastolic failure in the setting of hypertension, hyperlipidemia and type 1 diabetes mellitus but without known coronary artery disease.  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  The patient specifically denies any chest pain at rest exertion, dyspnea at rest or with exertion, orthopnea, paroxysmal nocturnal dyspnea, syncope, palpitations, focal neurological deficits, intermittent claudication, lower extremity edema, unexplained weight gain, cough, hemoptysis or wheezing.  She continues to have a high normal resting heart rate and occasional orthostatic dizziness.  She has normal left ventricular systolic function and mild aortic valve sclerosis by echo in 2015.  In March 2019 her echo showed a hyperdynamic left ventricle with mid cavity obliteration.  Also March 2019 she had normal carotid Dopplers.  Normal nuclear stress test in 2015.  Past Medical History:  Diagnosis Date  . Arthritis   . Bronchitis   . Chronic diastolic heart failure (HCC) 10/19/2014  . Diabetes mellitus without complication (HCC)    Type 1; pt has insulin pump  . Family history of adverse reaction to anesthesia    Patients mother has N/V  . GERD (gastroesophageal reflux disease)   . Heart murmur   . Hypertension   . Pneumonia    hx of  . Positive PPD   . Shortness of breath dyspnea    uses Albuterol inhaler PRN    Past Surgical History:  Procedure Laterality Date  . CARPAL TUNNEL RELEASE Bilateral   . CERVICAL DISCECTOMY     X 2  . CESAREAN SECTION    . COLONOSCOPY    . EYE SURGERY      as a child  . MAXIMUM ACCESS (MAS)POSTERIOR LUMBAR INTERBODY FUSION (PLIF) 1 LEVEL N/A 07/10/2016   Procedure: Lumbar four-five Maximum access posterior lumbar interbody fusion with resection of synovial cyst;  Surgeon: Maeola HarmanJoseph Stern, MD;  Location: [facility];  Service: Neurosurgery;  Laterality: N/A;  . TRIGGER FINGER RELEASE Bilateral   . ULNAR NERVE REPAIR Bilateral     Current Medications: Outpatient Medications Prior to Visit  Medication Sig Dispense Refill  . ACCU-CHEK AVIVA PLUS test strip   0  . ACCU-CHEK FASTCLIX LANCETS MISC     . acetaminophen (TYLENOL) 500 MG tablet Take 1,000 mg by mouth 2 (two) times daily.    Marland Kitchen. ADVAIR DISKUS 100-50 MCG/DOSE AEPB Inhale 1 puff into the lungs daily as needed.     Marland Kitchen. albuterol (PROVENTIL HFA;VENTOLIN HFA) 108 (90 Base) MCG/ACT inhaler Inhale 1 puff into the lungs every 6 (six) hours as needed for wheezing or shortness of breath.    Marland Kitchen. aspirin 325 MG tablet Take 325 mg by mouth daily.    . Calcium Carb-Cholecalciferol (CALCIUM 600/VITAMIN D3) 600-800 MG-UNIT TABS Take 1 tablet by mouth daily.    Marland Kitchen. etodolac (LODINE) 400 MG tablet     . fluticasone (FLONASE) 50 MCG/ACT nasal spray Place 1 spray into both nostrils daily.    . Insulin Human (INSULIN PUMP) SOLN Inject into the skin.    Marland Kitchen. JARDIANCE 10 MG TABS tablet Take 10 mg by mouth daily.  0  . KRILL OIL PO Take 1 Dose by mouth daily.    Marland Kitchen levothyroxine (SYNTHROID, LEVOTHROID) 112 MCG tablet Take 1 tablet by mouth daily.    Marland Kitchen losartan (COZAAR) 50 MG tablet Take 1.5 tablets (75 mg total) by mouth daily. 90 tablet 0  . magnesium oxide (MAG-OX) 400 MG tablet Take 400 mg by mouth daily. Take 2 tablets daily    . metoprolol succinate (TOPROL XL) 25 MG 24 hr tablet Take 0.5 tablets (12.5 mg total) by mouth daily. 90 tablet 1  . Multiple Vitamin (MULTIVITAMIN) tablet Take 1 tablet by mouth daily.    . Multiple Vitamins-Minerals (ICAPS AREDS 2) CAPS Take by mouth.    Marland Kitchen NOVOLOG 100 UNIT/ML injection  Inject into the skin daily.     Marland Kitchen PRILOSEC OTC 20 MG tablet Take 1 tablet by mouth daily.    . rosuvastatin (CRESTOR) 20 MG tablet Take 1 tablet (20 mg total) by mouth daily. 30 tablet 0  . tiZANidine (ZANAFLEX) 2 MG tablet     . traMADol (ULTRAM) 50 MG tablet Take 1 tablet (50 mg total) by mouth every 6 (six) hours as needed for moderate pain. 60 tablet 0  . hydrochlorothiazide (MICROZIDE) 12.5 MG capsule Take 1 capsule (12.5 mg total) by mouth daily. 30 capsule 3   No facility-administered medications prior to visit.      Allergies:   No known allergies and Latex   Social History   Socioeconomic History  . Marital status: Married    Spouse name: Not on file  . Number of children: Not on file  . Years of education: Not on file  . Highest education level: Not on file  Occupational History  . Not on file  Social Needs  . Financial resource strain: Not on file  . Food insecurity:    Worry: Not on file    Inability: Not on file  . Transportation needs:    Medical: Not on file    Non-medical: Not on file  Tobacco Use  . Smoking status: Never Smoker  . Smokeless tobacco: Never Used  Substance and Sexual Activity  . Alcohol use: No  . Drug use: No  . Sexual activity: Not on file  Lifestyle  . Physical activity:    Days per week: Not on file    Minutes per session: Not on file  . Stress: Not on file  Relationships  . Social connections:    Talks on phone: Not on file    Gets together: Not on file    Attends religious service: Not on file    Active member of club or organization: Not on file    Attends meetings of clubs or organizations: Not on file    Relationship status: Not on file  Other Topics Concern  . Not on file  Social History Narrative  . Not on file     Family History:  The patient's family history includes Alcoholism in her father; Cancer in her maternal grandfather; Hypertension in her mother; Stroke in her paternal grandfather.   ROS:   Please see the  history of present illness.    ROS all other symptoms are reviewed and are negative   PHYSICAL EXAM:   VS:  BP 138/66   Pulse 78   Ht 5\' 4"  (1.626 m)   Wt 212 lb 6.4 oz (96.3 kg)   BMI 36.46 kg/m     General: Alert, oriented x3, no distress, moderate to severely obese Head: no  evidence of trauma, PERRL, EOMI, no exophtalmos or lid lag, no myxedema, no xanthelasma; normal ears, nose and oropharynx Neck: normal jugular venous pulsations and no hepatojugular reflux; brisk carotid pulses without delay and no carotid bruits Chest: clear to auscultation, no signs of consolidation by percussion or palpation, normal fremitus, symmetrical and full respiratory excursions Cardiovascular: normal position and quality of the apical impulse, regular rhythm, normal first and second heart sounds, early peaking 2/6 aortic ejection murmur, no diastolic murmurs, rubs or gallops Abdomen: no tenderness or distention, no masses by palpation, no abnormal pulsatility or arterial bruits, normal bowel sounds, no hepatosplenomegaly Extremities: no clubbing, cyanosis or edema; 2+ radial, ulnar and brachial pulses bilaterally; 2+ right femoral, posterior tibial and dorsalis pedis pulses; 2+ left femoral, posterior tibial and dorsalis pedis pulses; no subclavian or femoral bruits Neurological: grossly nonfocal Psych: Normal mood and affect  Wt Readings from Last 3 Encounters:  08/02/18 212 lb 6.4 oz (96.3 kg)  04/14/18 210 lb 6.4 oz (95.4 kg)  03/16/18 209 lb (94.8 kg)      Studies/Labs Reviewed:   EKG:  EKG is not ordered today.  The ekg ordered 06/19/2016 demonstrates Normal sinus rhythm with borderline voltage criteria for LVH  Recent Labs: No results found for requested labs within last 8760 hours.   Lipid Panel Total cholesterol 163, triglycerides 77, HDL 44, LDL 104, hemoglobin A1c 6.4%  ASSESSMENT:    1. Chronic diastolic heart failure (HCC)   2. Essential hypertension   3. Hypercholesterolemia     4. Hollenhorst plaque   5. Diabetes mellitus type 2 in obese (HCC)   6. Aortic valve sclerosis      PLAN:  In order of problems listed above:  1. CHF: She appears well compensated, excellent functional status.  Reminded her that the Jardiance because of potent diuretic effect.  This may be why she is always slightly quite tachycardic in a little dizzy.  She should drink plenty of fluids. 2. HTN: Well-controlled.  Blood pressure slightly higher than target, but she reports that at home is typically in the 110-120.                       3. HLP: I think the diagnosis of plaques should be considered the equivalent of a vascular event.  Therefore I would think she qualifies for secondary prevention and her target LDL cholesterol should be less than 70.  She is scheduled to have labs with her PCP this week.  If necessary, can talk about adding ezetimibe or Repatha to her relatively high-dose rosuvastatin. 4. DM: She reports excellent glycemic control 5. Murmur of aortic valve sclerosis, unchanged.  Will be louder at times if she has hyperdynamic LV function, such as when she is relatively "dry".    Medication Adjustments/Labs and Tests Ordered: Current medicines are reviewed at length with the patient today.  Concerns regarding medicines are outlined above.  Medication changes, Labs and Tests ordered today are listed in the Patient Instructions below. Patient Instructions  Dr Royann Shiversroitoru recommends that you schedule a follow-up appointment in 12 months. You will receive a reminder letter in the mail two months in advance. If you don't receive a letter, please call our office to schedule the follow-up appointment.  If you need a refill on your cardiac medications before your next appointment, please call your pharmacy.    Signed, Thurmon FairMihai Regla Fitzgibbon, MD  08/03/2018 6:55 PM    [facility] [address]  [address] [place], [place]  [address] Phone: [phone]; Fax: [phone]

## 2018-08-02 NOTE — Patient Instructions (Signed)
Dr Croitoru recommends that you schedule a follow-up appointment in 12 months. You will receive a reminder letter in the mail two months in advance. If you don't receive a letter, please call our office to schedule the follow-up appointment.  If you need a refill on your cardiac medications before your next appointment, please call your pharmacy. 

## 2018-08-03 ENCOUNTER — Encounter: Payer: Self-pay | Admitting: Cardiovascular Disease

## 2018-08-03 NOTE — Progress Notes (Signed)
Patient presents with ongoing pain in her Right posterior heel, she has tried injections and offloading with a boot but the pain continues.  She says that she has had a significant improvement in pain since her last treatment.  She feels like the treatment has worked very well so far and has very little pain at this point.  Mild pain on palpation to posterior heel n her right foot.  ESWT administered to the area for  6.5J and tolerated well.  Patient is to follow-up as needed if symptoms worsen or fail to improve.

## 2018-08-16 ENCOUNTER — Other Ambulatory Visit: Payer: Self-pay | Admitting: Cardiology

## 2018-08-20 ENCOUNTER — Telehealth: Payer: Self-pay | Admitting: Cardiovascular Disease

## 2018-08-20 MED ORDER — LOSARTAN POTASSIUM 50 MG PO TABS: 75.0000 mg | ORAL_TABLET | Freq: Every day | ORAL | 3 refills | Status: DC

## 2018-08-20 NOTE — Telephone Encounter (Signed)
New Message    *STAT* If patient is at the pharmacy, call can be transferred to refill team.   1. Which medications need to be refilled? (please list name of each medication and dose if known) losartan (COZAAR) 50 MG tablet  2. Which pharmacy/location (including street and city if local pharmacy) is medication to be sent to? [facility] [address], [place] - [address]  3. Do they need a 30 day or 90 day supply?

## 2018-09-01 ENCOUNTER — Encounter: Payer: Self-pay | Admitting: Cardiovascular Disease

## 2018-11-15 ENCOUNTER — Telehealth: Payer: Self-pay | Admitting: Cardiovascular Disease

## 2018-11-15 MED ORDER — METOPROLOL SUCCINATE ER 25 MG PO TB24: 25.0000 mg | ORAL_TABLET | Freq: Every day | ORAL | 3 refills | Status: DC

## 2018-11-15 NOTE — Telephone Encounter (Signed)
Let's increase the metoprolol succinate to 25 mg daily. Please confirm that her pill bottle is indeed succinate and not tartrate (which wears off in 12 hours).

## 2018-11-15 NOTE — Telephone Encounter (Signed)
Pt c/o BP issue: STAT if pt c/o blurred vision, one-sided weakness or slurred speech  1. What are your last 5 BP readings? 146 to 160 Systolic- and Bystolic 75 to 85 most of the time in the evening and heart rate is high also  2. Are you having any other symptoms (ex. Dizziness, headache, blurred vision, passed out)? A little headache at time, which is unusual for her   3. What is your BP issue? Blood Pressure running high in the evening

## 2018-11-15 NOTE — Telephone Encounter (Signed)
Spoke with pt regarding elevated Bps; pt stated that her BP this morning was 120s systolic and 70s diastolic, but she is concerned about her pressure readings for the evening which have been in the 146-160 range systolic and 75-85 range diastolic; states that she has episodes when HR will be elevated in the 90-100 range. Pt taking all antihypertensive meds as prescribed. Pt reports that Dr. Salena Saner instructed her to take an extra 12.5mg  of metoprolol when BP rises; Pt is a [employer] fund[employer] and wants to know if medications need to be adjusted. Advised patient that message will be forwarded to Dr. Salena Saner and give her a follow up call with his recommendation; pt agreeable with plan

## 2018-11-15 NOTE — Telephone Encounter (Signed)
Pt updated with Dr. Erin Hearingroitoru's recommendation and verbalized understanding. New prescription sent to pharmacy on file

## 2019-02-18 ENCOUNTER — Telehealth: Payer: Self-pay

## 2019-02-18 MED ORDER — LOSARTAN POTASSIUM 25 MG PO TABS: 75.0000 mg | ORAL_TABLET | Freq: Every day | ORAL | 1 refills | Status: DC

## 2019-02-18 NOTE — Telephone Encounter (Signed)
Losartan 50 mg on backorder indefinitely.  Pharmacy requesting an alternative - Losartan 25 mg 3 tablets QD.  Okay to refill? Please advise.

## 2019-02-18 NOTE — Telephone Encounter (Signed)
Rx(s) sent to pharmacy electronically.  

## 2019-02-18 NOTE — Telephone Encounter (Signed)
Yes please, 25X3 daily, 3 mos supply, 3Rf MCr

## 2019-05-11 ENCOUNTER — Other Ambulatory Visit: Payer: Self-pay | Admitting: Cardiovascular Disease

## 2019-05-11 NOTE — Telephone Encounter (Signed)
Rx has been sent to the pharmacy electronically. ° °

## 2019-08-10 ENCOUNTER — Other Ambulatory Visit: Payer: Self-pay | Admitting: Cardiovascular Disease

## 2019-08-12 ENCOUNTER — Telehealth (INDEPENDENT_AMBULATORY_CARE_PROVIDER_SITE_OTHER): Payer: BC Managed Care – PPO | Admitting: Cardiovascular Disease

## 2019-08-12 DIAGNOSIS — I5032 Chronic diastolic (congestive) heart failure: Secondary | ICD-10-CM | POA: Diagnosis not present

## 2019-08-12 DIAGNOSIS — E1059 Type 1 diabetes mellitus with other circulatory complications: Secondary | ICD-10-CM

## 2019-08-12 DIAGNOSIS — E7800 Pure hypercholesterolemia, unspecified: Secondary | ICD-10-CM | POA: Diagnosis not present

## 2019-08-12 DIAGNOSIS — I10 Essential (primary) hypertension: Secondary | ICD-10-CM | POA: Diagnosis not present

## 2019-08-12 NOTE — Patient Instructions (Signed)

## 2019-08-12 NOTE — Progress Notes (Signed)
Virtual Visit via Video Note   This visit type was conducted due to national recommendations for restrictions regarding the COVID-19 Pandemic (e.g. social distancing) in an effort to limit this patient's exposure and mitigate transmission in our community.  Due to her co-morbid illnesses, this patient is at least at moderate risk for complications without adequate follow up.  This format is felt to be most appropriate for this patient at this time.  All issues noted in this document were discussed and addressed.  A limited physical exam was performed with this format.  Please refer to the patient's chart for her consent to telehealth for [facility].   Date:  08/12/2019   ID:  Wanda Hicks, DOB 05-Sep-1961, MRN [id number]  Patient Location: Other:  work Provider Location: Home  PCP:  Geoffry ParadiseAronson, Richard, MD  Cardiologist:  Thurmon FairMihai Ahriana Gunkel, MD  Electrophysiologist:  None   Evaluation Performed:  Follow-Up Visit  Chief Complaint:  CHF  History of Present Illness:    Wanda RunningSuzanne M Strite is a 58 y.o. 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  From a cardiovascular standpoint she is done very well and has not required hospitalization or diuretic dose adjustment for heart failure.  She denies any problems with leg edema or dyspnea.  Jardiance was added to her chronic insulin therapy since she had gained weight and had developed some features of insulin resistance.  She has been losing weight and glycemic control is good.  Unfortunately she has had issues with intermittent problems with hyperglycemia since her insulin infusion cannula becomes kinked, since she has a lot of loose skin following her weight loss.  She becomes symptomatic when her blood glucose reaches about 190 (tachycardia, tachypnea, weakness).  She thinks that she might need a tummy  tuck just to be able to get the insulin properly.  She has been more physically active since her arthritis has been well controlled.  She is aggressively engaged in [employer] watchers.  She walks every day and also uses resistance bands and light weights.  She gets well over 3 hours of exercise a week.  She was hoping that she be able to back off her antihypertensives with weight loss, but her typical blood pressure is 118/62-135/70. She has not had any focal neurological events to suggest stroke or TIA or any visual changes.  She has normal left ventricular systolic function and mild aortic valve sclerosis by echo in 2015.  In March 2019 her echo showed a hyperdynamic left ventricle with mid cavity obliteration.  Also March 2019 she had normal carotid Dopplers.  Normal nuclear stress test in 2015.  The patient does not have symptoms concerning for COVID-19 infection (fever, chills, cough, or new shortness of breath).    Past Medical History:  Diagnosis Date  . Arthritis   . Bronchitis   . Chronic diastolic heart failure (HCC) 10/19/2014  . Diabetes mellitus without complication (HCC)    Type 1; pt has insulin pump  . Family history of adverse reaction to anesthesia    Patients mother has N/V  . GERD (gastroesophageal reflux disease)   . Heart murmur   . Hypertension   . Pneumonia    hx of  . Positive PPD   . Shortness of breath dyspnea    uses Albuterol inhaler PRN   Past Surgical History:  Procedure Laterality Date  . CARPAL TUNNEL RELEASE Bilateral   .  CERVICAL DISCECTOMY     X 2  . CESAREAN SECTION    . COLONOSCOPY    . EYE SURGERY     as a child  . MAXIMUM ACCESS (MAS)POSTERIOR LUMBAR INTERBODY FUSION (PLIF) 1 LEVEL N/A 07/10/2016   Procedure: Lumbar four-five Maximum access posterior lumbar interbody fusion with resection of synovial cyst;  Surgeon: Erline Levine, MD;  Location: [facility];  Service: Neurosurgery;  Laterality: N/A;  . TRIGGER FINGER RELEASE Bilateral   .  ULNAR NERVE REPAIR Bilateral      Current Meds  Medication Sig  . acetaminophen (TYLENOL) 500 MG tablet Take 1,000 mg by mouth 2 (two) times daily.  Marland Kitchen ADVAIR DISKUS 100-50 MCG/DOSE AEPB Inhale 1 puff into the lungs daily as needed.   Marland Kitchen albuterol (PROVENTIL HFA;VENTOLIN HFA) 108 (90 Base) MCG/ACT inhaler Inhale 1 puff into the lungs every 6 (six) hours as needed for wheezing or shortness of breath.  Marland Kitchen aspirin 325 MG tablet Take 81 mg by mouth daily.   . Calcium Carb-Cholecalciferol (CALCIUM 600/VITAMIN D3) 600-800 MG-UNIT TABS Take 1 tablet by mouth daily.  . fluticasone (FLONASE) 50 MCG/ACT nasal spray Place 1 spray into both nostrils daily.  . hydrochlorothiazide (MICROZIDE) 12.5 MG capsule TAKE ONE CAPSULE BY MOUTH DAILY  . Insulin Human (INSULIN PUMP) SOLN Inject into the skin.  Marland Kitchen JARDIANCE 10 MG TABS tablet Take 10 mg by mouth daily.  Marland Kitchen levothyroxine (SYNTHROID, LEVOTHROID) 112 MCG tablet Take 1 tablet by mouth daily.  Marland Kitchen losartan (COZAAR) 25 MG tablet Take 3 tablets (75 mg total) by mouth daily.  . magnesium oxide (MAG-OX) 400 MG tablet Take 400 mg by mouth daily. Take 2 tablets daily  . metoprolol succinate (TOPROL XL) 25 MG 24 hr tablet Take 1 tablet (25 mg total) by mouth daily.  . Multiple Vitamin (MULTIVITAMIN) tablet Take 1 tablet by mouth daily.  Marland Kitchen NOVOLOG 100 UNIT/ML injection Inject into the skin daily.   Marland Kitchen PRILOSEC OTC 20 MG tablet Take 1 tablet by mouth daily.  . rosuvastatin (CRESTOR) 20 MG tablet Take 1 tablet (20 mg total) by mouth daily.  Marland Kitchen tiZANidine (ZANAFLEX) 2 MG tablet   . traMADol (ULTRAM) 50 MG tablet Take 1 tablet (50 mg total) by mouth every 6 (six) hours as needed for moderate pain.     Allergies:   No known allergies and Latex   Social History   Tobacco Use  . Smoking status: Never Smoker  . Smokeless tobacco: Never Used  Substance Use Topics  . Alcohol use: No  . Drug use: No     Family Hx: The patient's family history includes Alcoholism in her  father; Cancer in her maternal grandfather; Hypertension in her mother; Stroke in her paternal grandfather.  ROS:   Please see the history of present illness.    All other systems reviewed and are negative.   Prior CV studies:   The following studies were reviewed today: Notes/labs from Dr. Reynaldo Minium  Labs/Other Tests and Data Reviewed:    EKG:  No ECG reviewed.  Recent Labs: No results found for requested labs within last 8760 hours.   Recent Lipid Panel No results found for: CHOL, TRIG, HDL, CHOLHDL, LDLCALC, LDLDIRECT  Wt Readings from Last 3 Encounters:  08/02/18 212 lb 6.4 oz (96.3 kg)  04/14/18 210 lb 6.4 oz (95.4 kg)  03/16/18 209 lb (94.8 kg)     Objective:    Vital Signs:  Ht 5\' 4"  (1.626 m)   BMI 36.46  kg/m    VITAL SIGNS:  reviewed GEN:  no acute distress EYES:  sclerae anicteric, EOMI - Extraocular Movements Intact RESPIRATORY:  normal respiratory effort, symmetric expansion CARDIOVASCULAR:  no peripheral edema SKIN:  no rash, lesions or ulcers. MUSCULOSKELETAL:  no obvious deformities. NEURO:  alert and oriented x 3, no obvious focal deficit PSYCH:  normal affect  ASSESSMENT & PLAN:    1. CHF: Very well compensated.  Not taking loop diuretics but is on a low-dose of hydrochlorothiazide as well as Jardiance and ARB. 2. HTN: Excellent control, target under 130/80.  This is important for both congestive heart failure and protection of renal function in the setting of type 1 diabetes. 3. DM: Check controlled, but she has events where her insulin infusion cannula becomes kinked leading to symptomatic hypoglycemia.  It is possible that she is more symptomatic now because she is also taking Jardiance which increases the glucosuria effect.  Advised her to stay very well-hydrated. 4. HLP: Scheduled for repeat labs with Dr. Jacky Kindle in September.  Target LDL cholesterol less than 70.  COVID-19 Education: The signs and symptoms of COVID-19 were discussed with the  patient and how to seek care for testing (follow up with PCP or arrange E-visit).  The importance of social distancing was discussed today.  Time:   Today, I have spent 26 minutes with the patient with telehealth technology discussing the above problems.     Medication Adjustments/Labs and Tests Ordered: Current medicines are reviewed at length with the patient today.  Concerns regarding medicines are outlined above.   Tests Ordered: No orders of the defined types were placed in this encounter.   Medication Changes: No orders of the defined types were placed in this encounter.   Follow Up:  In Person 1 year  Signed, Thurmon Fair, MD  08/12/2019 10:01 AM    [facility]

## 2019-09-09 ENCOUNTER — Encounter: Payer: Self-pay | Admitting: Plastic Surgery

## 2019-09-09 ENCOUNTER — Other Ambulatory Visit: Payer: Self-pay

## 2019-09-09 ENCOUNTER — Ambulatory Visit (INDEPENDENT_AMBULATORY_CARE_PROVIDER_SITE_OTHER): Payer: BC Managed Care – PPO | Admitting: Plastic Surgery

## 2019-09-09 VITALS — BP 129/77 | HR 88 | Temp 98.2°F | Resp 16 | Ht 64.0 in | Wt 140.0 lb

## 2019-09-09 DIAGNOSIS — M793 Panniculitis, unspecified: Secondary | ICD-10-CM | POA: Diagnosis not present

## 2019-09-09 DIAGNOSIS — I5032 Chronic diastolic (congestive) heart failure: Secondary | ICD-10-CM | POA: Diagnosis not present

## 2019-09-09 DIAGNOSIS — E1059 Type 1 diabetes mellitus with other circulatory complications: Secondary | ICD-10-CM | POA: Diagnosis not present

## 2019-09-09 DIAGNOSIS — M4316 Spondylolisthesis, lumbar region: Secondary | ICD-10-CM | POA: Diagnosis not present

## 2019-09-09 NOTE — Progress Notes (Signed)
Patient ID: Wanda Hicks, female    DOB: May 19, 1961, 58 y.o.   MRN: [id number]   Chief Complaint  Patient presents with  . Skin Problem    The patient is a 58 year old white female here for evaluation of her abdomen.  The patient is 5 feet 4 inches tall.  She weighs 140 pounds.  She has had over 70 pound weight loss over the past 9 months.  She has been doing weight watchers and increasing her activity in order to achieve this goal.  She is insulin-dependent diabetic.  She has a insulin pump.  She has noticed that since she has lost the weight she is unable to easily use her pump.  She has to go through several pads of the pump because they are not adhering to her skin and soft tissue because of the looseness.  She also has frequent rashes in the skin folds now that she has had the weight loss.  She has thyroid disease and heart disease as well as hyperlipidemia.  Dr. Geoffry Paradiseichard Aronson has been working with her diabetes care.  Her last hemoglobin A1c was 5.6.  She is doing very well with her sugar control on an ongoing basis however with the weight loss she has had several episodes of diabetic ketoacidosis.  She believes this is related to the pads not connecting well.  Her last episode was 1 week ago.  She has mild rectus diastases but no hernia.  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   Review of Systems  Constitutional: Positive for activity change. Negative for appetite change.  HENT: Negative.   Eyes: Negative.   Respiratory: Negative.  Negative for chest tightness and shortness of breath.   Cardiovascular: Negative.   Gastrointestinal: Negative.  Negative for abdominal distention, abdominal pain and blood in stool.  Endocrine: Negative.   Genitourinary: Negative.   Musculoskeletal: Negative.   Skin: Positive for color change.  Neurological: Negative.   Psychiatric/Behavioral: Negative.     Past Medical History:  Diagnosis Date  . Arthritis   . Bronchitis   .  Chronic diastolic heart failure (HCC) 10/19/2014  . Diabetes mellitus without complication (HCC)    Type 1; pt has insulin pump  . Family history of adverse reaction to anesthesia    Patients mother has N/V  . GERD (gastroesophageal reflux disease)   . Heart murmur   . Hypertension   . Pneumonia    hx of  . Positive PPD   . Shortness of breath dyspnea    uses Albuterol inhaler PRN    Past Surgical History:  Procedure Laterality Date  . CARPAL TUNNEL RELEASE Bilateral   . CERVICAL DISCECTOMY     X 2  . CESAREAN SECTION    . COLONOSCOPY    . EYE SURGERY     as a child  . MAXIMUM ACCESS (MAS)POSTERIOR LUMBAR INTERBODY FUSION (PLIF) 1 LEVEL N/A 07/10/2016   Procedure: Lumbar four-five Maximum access posterior lumbar interbody fusion with resection of synovial cyst;  Surgeon: Maeola HarmanJoseph Stern, MD;  Location: [facility];  Service: Neurosurgery;  Laterality: N/A;  . TRIGGER FINGER RELEASE Bilateral   . ULNAR NERVE REPAIR Bilateral       Current Outpatient Medications:  .  ACCU-CHEK AVIVA PLUS test strip, , Disp: , Rfl: 0 .  ACCU-CHEK FASTCLIX LANCETS MISC, , Disp: , Rfl:  .  acetaminophen (TYLENOL) 500 MG tablet, Take 1,000 mg by mouth 2 (two) times daily.,  Disp: , Rfl:  .  ADVAIR DISKUS 100-50 MCG/DOSE AEPB, Inhale 1 puff into the lungs daily as needed. , Disp: , Rfl:  .  albuterol (PROVENTIL HFA;VENTOLIN HFA) 108 (90 Base) MCG/ACT inhaler, Inhale 1 puff into the lungs every 6 (six) hours as needed for wheezing or shortness of breath., Disp: , Rfl:  .  aspirin 325 MG tablet, Take 81 mg by mouth daily. , Disp: , Rfl:  .  Calcium Carb-Cholecalciferol (CALCIUM 600/VITAMIN D3) 600-800 MG-UNIT TABS, Take 1 tablet by mouth daily., Disp: , Rfl:  .  etodolac (LODINE) 400 MG tablet, , Disp: , Rfl:  .  fluticasone (FLONASE) 50 MCG/ACT nasal spray, Place 1 spray into both nostrils daily., Disp: , Rfl:  .  hydrochlorothiazide (MICROZIDE) 12.5 MG capsule, TAKE ONE CAPSULE BY MOUTH DAILY, Disp: 90  capsule, Rfl: 1 .  Insulin Human (INSULIN PUMP) SOLN, Inject into the skin., Disp: , Rfl:  .  JARDIANCE 10 MG TABS tablet, Take 10 mg by mouth daily., Disp: , Rfl: 0 .  KRILL OIL PO, Take 1 Dose by mouth daily., Disp: , Rfl:  .  levothyroxine (SYNTHROID, LEVOTHROID) 112 MCG tablet, Take 1 tablet by mouth daily., Disp: , Rfl:  .  losartan (COZAAR) 25 MG tablet, TAKE THREE TABLETS BY MOUTH DAILY, Disp: 270 tablet, Rfl: 3 .  magnesium oxide (MAG-OX) 400 MG tablet, Take 400 mg by mouth daily. Take 2 tablets daily, Disp: , Rfl:  .  metoprolol succinate (TOPROL XL) 25 MG 24 hr tablet, Take 1 tablet (25 mg total) by mouth daily., Disp: 90 tablet, Rfl: 3 .  Multiple Vitamin (MULTIVITAMIN) tablet, Take 1 tablet by mouth daily., Disp: , Rfl:  .  Multiple Vitamins-Minerals (ICAPS AREDS 2) CAPS, Take by mouth., Disp: , Rfl:  .  NOVOLOG 100 UNIT/ML injection, Inject into the skin daily. , Disp: , Rfl:  .  PRILOSEC OTC 20 MG tablet, Take 1 tablet by mouth daily., Disp: , Rfl:  .  rosuvastatin (CRESTOR) 20 MG tablet, Take 1 tablet (20 mg total) by mouth daily., Disp: 30 tablet, Rfl: 0 .  tiZANidine (ZANAFLEX) 2 MG tablet, , Disp: , Rfl:  .  traMADol (ULTRAM) 50 MG tablet, Take 1 tablet (50 mg total) by mouth every 6 (six) hours as needed for moderate pain., Disp: 60 tablet, Rfl: 0   Objective:   Vitals:   09/09/19 1745  BP: 129/77  Pulse: 88  Resp: 16  Temp: 98.2 F (36.8 C)  SpO2: 100%    Physical Exam Vitals signs and nursing note reviewed.  Constitutional:      Appearance: Normal appearance.  HENT:     Head: Normocephalic and atraumatic.  Eyes:     Extraocular Movements: Extraocular movements intact.  Cardiovascular:     Rate and Rhythm: Normal rate.     Pulses: Normal pulses.  Pulmonary:     Effort: Pulmonary effort is normal. No respiratory distress.     Breath sounds: No wheezing.  Abdominal:     General: Abdomen is flat. There is no distension.     Palpations: There is no mass.      Tenderness: There is no abdominal tenderness.     Hernia: No hernia is present.  Skin:    General: Skin is warm.  Neurological:     General: No focal deficit present.     Mental Status: She is alert and oriented to person, place, and time.  Psychiatric:        Mood  and Affect: Mood normal.        Behavior: Behavior normal.        Thought Content: Thought content normal.     Assessment & Plan:  DM type 1 causing vascular disease ([place])  Panniculitis  Spondylolisthesis of lumbar region  Chronic diastolic heart failure ([place])  The patient is a reasonable candidate for panniculectomy.  We discussed the difference between that and an abdominoplasty.  She seems to understand the limitations.  I think in regards to her diabetic issues she would do better with having the excess tissue removed.  This would not be without risk of complication because of her medical condition. Pictures were obtained of the patient and placed in the chart with the patient's or guardian's permission.   [facility], DO

## 2019-09-12 ENCOUNTER — Emergency Department (HOSPITAL_COMMUNITY): Payer: BC Managed Care – PPO

## 2019-09-12 ENCOUNTER — Encounter (HOSPITAL_COMMUNITY): Payer: Self-pay

## 2019-09-12 ENCOUNTER — Inpatient Hospital Stay (HOSPITAL_COMMUNITY)
Admission: EM | Admit: 2019-09-12 | Discharge: 2019-09-15 | DRG: 919 | Disposition: A | Discharge status: Home / Self Care | Payer: BC Managed Care – PPO | Attending: Internal Medicine | Admitting: Internal Medicine

## 2019-09-12 ENCOUNTER — Other Ambulatory Visit: Payer: Self-pay

## 2019-09-12 DIAGNOSIS — D72829 Elevated white blood cell count, unspecified: Secondary | ICD-10-CM | POA: Diagnosis present

## 2019-09-12 DIAGNOSIS — I361 Nonrheumatic tricuspid (valve) insufficiency: Secondary | ICD-10-CM | POA: Diagnosis not present

## 2019-09-12 DIAGNOSIS — Z811 Family history of alcohol abuse and dependence: Secondary | ICD-10-CM

## 2019-09-12 DIAGNOSIS — E860 Dehydration: Secondary | ICD-10-CM | POA: Diagnosis present

## 2019-09-12 DIAGNOSIS — Z7982 Long term (current) use of aspirin: Secondary | ICD-10-CM | POA: Diagnosis not present

## 2019-09-12 DIAGNOSIS — I959 Hypotension, unspecified: Secondary | ICD-10-CM | POA: Diagnosis present

## 2019-09-12 DIAGNOSIS — Z809 Family history of malignant neoplasm, unspecified: Secondary | ICD-10-CM | POA: Diagnosis not present

## 2019-09-12 DIAGNOSIS — E039 Hypothyroidism, unspecified: Secondary | ICD-10-CM | POA: Diagnosis present

## 2019-09-12 DIAGNOSIS — I5032 Chronic diastolic (congestive) heart failure: Secondary | ICD-10-CM | POA: Diagnosis present

## 2019-09-12 DIAGNOSIS — Z9641 Presence of insulin pump (external) (internal): Secondary | ICD-10-CM | POA: Diagnosis present

## 2019-09-12 DIAGNOSIS — Z794 Long term (current) use of insulin: Secondary | ICD-10-CM

## 2019-09-12 DIAGNOSIS — R7989 Other specified abnormal findings of blood chemistry: Secondary | ICD-10-CM

## 2019-09-12 DIAGNOSIS — E1110 Type 2 diabetes mellitus with ketoacidosis without coma: Secondary | ICD-10-CM | POA: Diagnosis present

## 2019-09-12 DIAGNOSIS — I421 Obstructive hypertrophic cardiomyopathy: Secondary | ICD-10-CM | POA: Diagnosis present

## 2019-09-12 DIAGNOSIS — E873 Alkalosis: Secondary | ICD-10-CM | POA: Diagnosis present

## 2019-09-12 DIAGNOSIS — Z981 Arthrodesis status: Secondary | ICD-10-CM | POA: Diagnosis not present

## 2019-09-12 DIAGNOSIS — I340 Nonrheumatic mitral (valve) insufficiency: Secondary | ICD-10-CM | POA: Diagnosis not present

## 2019-09-12 DIAGNOSIS — Z8249 Family history of ischemic heart disease and other diseases of the circulatory system: Secondary | ICD-10-CM | POA: Diagnosis not present

## 2019-09-12 DIAGNOSIS — E1010 Type 1 diabetes mellitus with ketoacidosis without coma: Secondary | ICD-10-CM | POA: Diagnosis present

## 2019-09-12 DIAGNOSIS — T85694A Other mechanical complication of insulin pump, initial encounter: Secondary | ICD-10-CM | POA: Diagnosis present

## 2019-09-12 DIAGNOSIS — I214 Non-ST elevation (NSTEMI) myocardial infarction: Secondary | ICD-10-CM

## 2019-09-12 DIAGNOSIS — Z79899 Other long term (current) drug therapy: Secondary | ICD-10-CM | POA: Diagnosis not present

## 2019-09-12 DIAGNOSIS — Z20828 Contact with and (suspected) exposure to other viral communicable diseases: Secondary | ICD-10-CM | POA: Diagnosis present

## 2019-09-12 DIAGNOSIS — I110 Hypertensive heart disease with heart failure: Secondary | ICD-10-CM | POA: Diagnosis present

## 2019-09-12 DIAGNOSIS — X58XXXA Exposure to other specified factors, initial encounter: Secondary | ICD-10-CM | POA: Diagnosis present

## 2019-09-12 DIAGNOSIS — Z823 Family history of stroke: Secondary | ICD-10-CM

## 2019-09-12 DIAGNOSIS — I2510 Atherosclerotic heart disease of native coronary artery without angina pectoris: Secondary | ICD-10-CM | POA: Diagnosis present

## 2019-09-12 DIAGNOSIS — Z79890 Hormone replacement therapy (postmenopausal): Secondary | ICD-10-CM | POA: Diagnosis not present

## 2019-09-12 DIAGNOSIS — I10 Essential (primary) hypertension: Secondary | ICD-10-CM | POA: Diagnosis present

## 2019-09-12 DIAGNOSIS — R778 Other specified abnormalities of plasma proteins: Secondary | ICD-10-CM

## 2019-09-12 DIAGNOSIS — R9431 Abnormal electrocardiogram [ECG] [EKG]: Secondary | ICD-10-CM | POA: Diagnosis not present

## 2019-09-12 DIAGNOSIS — R739 Hyperglycemia, unspecified: Secondary | ICD-10-CM

## 2019-09-12 DIAGNOSIS — Z7951 Long term (current) use of inhaled steroids: Secondary | ICD-10-CM

## 2019-09-12 DIAGNOSIS — J449 Chronic obstructive pulmonary disease, unspecified: Secondary | ICD-10-CM

## 2019-09-12 DIAGNOSIS — K219 Gastro-esophageal reflux disease without esophagitis: Secondary | ICD-10-CM | POA: Diagnosis present

## 2019-09-12 LAB — BASIC METABOLIC PANEL
Anion gap: 20 — ABNORMAL HIGH (ref 5–15)
Anion gap: 12 (ref 5–15)
Anion gap: 17 — ABNORMAL HIGH (ref 5–15)
BUN: 21 mg/dL — ABNORMAL HIGH (ref 6–20)
BUN: 21 mg/dL — ABNORMAL HIGH (ref 6–20)
BUN: 23 mg/dL — ABNORMAL HIGH (ref 6–20)
CO2: 18 mmol/L — ABNORMAL LOW (ref 22–32)
CO2: 18 mmol/L — ABNORMAL LOW (ref 22–32)
CO2: 15 mmol/L — ABNORMAL LOW (ref 22–32)
Calcium: 9.5 mg/dL (ref 8.9–10.3)
Calcium: 8.6 mg/dL — ABNORMAL LOW (ref 8.9–10.3)
Calcium: 8.8 mg/dL — ABNORMAL LOW (ref 8.9–10.3)
Chloride: 100 mmol/L (ref 98–111)
Chloride: 109 mmol/L (ref 98–111)
Chloride: 108 mmol/L (ref 98–111)
Creatinine, Ser: 1.06 mg/dL — ABNORMAL HIGH (ref 0.44–1.00)
Creatinine, Ser: 1.02 mg/dL — ABNORMAL HIGH (ref 0.44–1.00)
Creatinine, Ser: 1.13 mg/dL — ABNORMAL HIGH (ref 0.44–1.00)
GFR calc Af Amer: >60 mL/min (ref >60)
GFR calc Af Amer: >60 mL/min (ref >60)
GFR calc Af Amer: >60 mL/min (ref >60)
GFR calc non Af Amer: 57 mL/min — ABNORMAL LOW (ref >60)
GFR calc non Af Amer: 60 mL/min — ABNORMAL LOW (ref >60)
GFR calc non Af Amer: 53 mL/min — ABNORMAL LOW (ref >60)
Glucose, Bld: 217 mg/dL — ABNORMAL HIGH (ref 70–99)
Glucose, Bld: 133 mg/dL — ABNORMAL HIGH (ref 70–99)
Glucose, Bld: 203 mg/dL — ABNORMAL HIGH (ref 70–99)
Potassium: 4.2 mmol/L (ref 3.5–5.1)
Potassium: 4.1 mmol/L (ref 3.5–5.1)
Potassium: 4.7 mmol/L (ref 3.5–5.1)
Sodium: 138 mmol/L (ref 135–145)
Sodium: 139 mmol/L (ref 135–145)
Sodium: 140 mmol/L (ref 135–145)

## 2019-09-12 LAB — POCT I-STAT EG7
Acid-base deficit: 2 mmol/L (ref 0.0–2.0)
Bicarbonate: 20.4 mmol/L (ref 20.0–28.0)
Calcium, Ion: 1.06 mmol/L — ABNORMAL LOW (ref 1.15–1.40)
HCT: 43 % (ref 36.0–46.0)
Hemoglobin: 14.6 g/dL (ref 12.0–15.0)
O2 Saturation: 99 %
Potassium: 4.1 mmol/L (ref 3.5–5.1)
Sodium: 136 mmol/L (ref 135–145)
TCO2: 21 mmol/L — ABNORMAL LOW (ref 22–32)
pCO2, Ven: 27.1 mmHg — ABNORMAL LOW (ref 44.0–60.0)
pH, Ven: 7.485 — ABNORMAL HIGH (ref 7.250–7.430)
pO2, Ven: 121 mmHg — ABNORMAL HIGH (ref 32.0–45.0)

## 2019-09-12 LAB — URINALYSIS, ROUTINE W REFLEX MICROSCOPIC
Bacteria, UA: NONE SEEN
Bilirubin Urine: NEGATIVE
Glucose, UA: >=500 mg/dL — AB
Hgb urine dipstick: NEGATIVE
Ketones, ur: 80 mg/dL — AB
Leukocytes,Ua: NEGATIVE
Nitrite: NEGATIVE
Protein, ur: NEGATIVE mg/dL
Specific Gravity, Urine: 1.021 (ref 1.005–1.030)
pH: 5 (ref 5.0–8.0)

## 2019-09-12 LAB — CBG MONITORING, ED
Glucose-Capillary: 112 mg/dL — ABNORMAL HIGH (ref 70–99)
Glucose-Capillary: 124 mg/dL — ABNORMAL HIGH (ref 70–99)
Glucose-Capillary: 131 mg/dL — ABNORMAL HIGH (ref 70–99)
Glucose-Capillary: 145 mg/dL — ABNORMAL HIGH (ref 70–99)
Glucose-Capillary: 158 mg/dL — ABNORMAL HIGH (ref 70–99)
Glucose-Capillary: 185 mg/dL — ABNORMAL HIGH (ref 70–99)
Glucose-Capillary: 190 mg/dL — ABNORMAL HIGH (ref 70–99)
Glucose-Capillary: 194 mg/dL — ABNORMAL HIGH (ref 70–99)
Glucose-Capillary: 210 mg/dL — ABNORMAL HIGH (ref 70–99)

## 2019-09-12 LAB — CBC
HCT: 42.6 % (ref 36.0–46.0)
Hemoglobin: 14.4 g/dL (ref 12.0–15.0)
MCH: 32.2 pg (ref 26.0–34.0)
MCHC: 33.8 g/dL (ref 30.0–36.0)
MCV: 95.3 fL (ref 80.0–100.0)
Platelets: 233 10*3/uL (ref 150–400)
RBC: 4.47 MIL/uL (ref 3.87–5.11)
RDW: 14 % (ref 11.5–15.5)
WBC: 10.5 10*3/uL (ref 4.0–10.5)
nRBC: 0 % (ref 0.0–0.2)

## 2019-09-12 LAB — BETA-HYDROXYBUTYRIC ACID: Beta-Hydroxybutyric Acid: 4.46 mmol/L — ABNORMAL HIGH (ref 0.05–0.27)

## 2019-09-12 LAB — SARS CORONAVIRUS 2 (TAT 6-24 HRS): SARS Coronavirus 2: NEGATIVE

## 2019-09-12 LAB — MAGNESIUM: Magnesium: 1.9 mg/dL (ref 1.7–2.4)

## 2019-09-12 LAB — LACTIC ACID, PLASMA: Lactic Acid, Venous: 1.5 mmol/L (ref 0.5–1.9)

## 2019-09-12 LAB — TROPONIN I (HIGH SENSITIVITY)
Troponin I (High Sensitivity): 6 ng/L (ref <18)
Troponin I (High Sensitivity): 209 ng/L (ref <18)

## 2019-09-12 LAB — HEMOGLOBIN A1C
Hgb A1c MFr Bld: 6 % — ABNORMAL HIGH (ref 4.8–5.6)
Mean Plasma Glucose: 125.5 mg/dL

## 2019-09-12 MED ORDER — DEXTROSE-NACL 5-0.45 % IV SOLN
INTRAVENOUS | Status: DC
  Administered 2019-09-12: 17:00:00 via INTRAVENOUS

## 2019-09-12 MED ORDER — FLUTICASONE PROPIONATE 50 MCG/ACT NA SUSP
1.0000 | Freq: Every day | NASAL | Status: DC
  Administered 2019-09-13 – 2019-09-15 (×3): 1 via NASAL
  Filled 2019-09-12: qty 16

## 2019-09-12 MED ORDER — SODIUM CHLORIDE 0.9 % IV BOLUS
1000.0000 mL | Freq: Once | INTRAVENOUS | Status: AC
  Administered 2019-09-12: 1000 mL via INTRAVENOUS

## 2019-09-12 MED ORDER — LEVOTHYROXINE SODIUM 25 MCG PO TABS
125.0000 ug | ORAL_TABLET | Freq: Every day | ORAL | Status: DC
  Administered 2019-09-13 – 2019-09-15 (×3): 125 ug via ORAL
  Filled 2019-09-12 (×3): qty 1

## 2019-09-12 MED ORDER — SODIUM CHLORIDE 0.9 % IV SOLN
INTRAVENOUS | Status: DC
  Administered 2019-09-12: 17:00:00 via INTRAVENOUS

## 2019-09-12 MED ORDER — INSULIN REGULAR(HUMAN) IN NACL 100-0.9 UT/100ML-% IV SOLN
INTRAVENOUS | Status: DC
  Administered 2019-09-12: 1.3 [IU]/h via INTRAVENOUS
  Administered 2019-09-13 (×2): 0.5 [IU]/h via INTRAVENOUS
  Filled 2019-09-12 (×2): qty 100

## 2019-09-12 MED ORDER — POTASSIUM CHLORIDE 10 MEQ/100ML IV SOLN
10.0000 meq | INTRAVENOUS | Status: DC
  Filled 2019-09-12: qty 100

## 2019-09-12 MED ORDER — PROMETHAZINE HCL 25 MG/ML IJ SOLN
12.5000 mg | Freq: Once | INTRAMUSCULAR | Status: AC
  Administered 2019-09-12: 12.5 mg via INTRAVENOUS
  Filled 2019-09-12: qty 1

## 2019-09-12 MED ORDER — IOHEXOL 350 MG/ML SOLN
100.0000 mL | Freq: Once | INTRAVENOUS | Status: AC | PRN
  Administered 2019-09-12: 100 mL via INTRAVENOUS

## 2019-09-12 MED ORDER — INSULIN REGULAR(HUMAN) IN NACL 100-0.9 UT/100ML-% IV SOLN
INTRAVENOUS | Status: DC
  Administered 2019-09-12: 0.9 [IU]/h via INTRAVENOUS
  Filled 2019-09-12: qty 100

## 2019-09-12 MED ORDER — POTASSIUM CHLORIDE 10 MEQ/100ML IV SOLN
10.0000 meq | INTRAVENOUS | Status: AC
  Administered 2019-09-12 (×2): 10 meq via INTRAVENOUS
  Filled 2019-09-12 (×2): qty 100

## 2019-09-12 MED ORDER — SODIUM CHLORIDE 0.9 % IV SOLN
INTRAVENOUS | Status: DC
  Administered 2019-09-12: 18:00:00 via INTRAVENOUS

## 2019-09-12 MED ORDER — IPRATROPIUM-ALBUTEROL 0.5-2.5 (3) MG/3ML IN SOLN: 3.0000 mL | Freq: Four times a day (QID) | RESPIRATORY_TRACT | Status: DC | PRN

## 2019-09-12 MED ORDER — SODIUM CHLORIDE 0.9 % IV BOLUS
1000.0000 mL | Freq: Once | INTRAVENOUS | Status: AC
  Administered 2019-09-12: 19:00:00 1000 mL via INTRAVENOUS

## 2019-09-12 MED ORDER — PANTOPRAZOLE SODIUM 40 MG PO TBEC
40.0000 mg | DELAYED_RELEASE_TABLET | Freq: Every day | ORAL | Status: DC
  Administered 2019-09-13 – 2019-09-15 (×3): 40 mg via ORAL
  Filled 2019-09-12 (×3): qty 1

## 2019-09-12 MED ORDER — HEPARIN (PORCINE) 25000 UT/250ML-% IV SOLN
800.0000 [IU]/h | INTRAVENOUS | Status: DC
  Administered 2019-09-12 – 2019-09-13 (×2): 800 [IU]/h via INTRAVENOUS
  Filled 2019-09-12 (×2): qty 250

## 2019-09-12 MED ORDER — ACETAMINOPHEN 500 MG PO TABS
1000.0000 mg | ORAL_TABLET | Freq: Two times a day (BID) | ORAL | Status: DC
  Administered 2019-09-12 – 2019-09-15 (×5): 1000 mg via ORAL
  Filled 2019-09-12 (×5): qty 2

## 2019-09-12 MED ORDER — SODIUM CHLORIDE 0.9 % IV BOLUS
1500.0000 mL | Freq: Once | INTRAVENOUS | Status: AC
  Administered 2019-09-12: 1500 mL via INTRAVENOUS

## 2019-09-12 MED ORDER — HEPARIN BOLUS VIA INFUSION
3800.0000 [IU] | Freq: Once | INTRAVENOUS | Status: AC
  Administered 2019-09-12: 19:00:00 3800 [IU] via INTRAVENOUS
  Filled 2019-09-12: qty 3800

## 2019-09-12 MED ORDER — ONDANSETRON HCL 4 MG/2ML IJ SOLN
4.0000 mg | Freq: Once | INTRAMUSCULAR | Status: AC
  Administered 2019-09-12: 17:00:00 4 mg via INTRAVENOUS
  Filled 2019-09-12: qty 2

## 2019-09-12 MED ORDER — METOPROLOL TARTRATE 12.5 MG HALF TABLET
12.5000 mg | ORAL_TABLET | Freq: Two times a day (BID) | ORAL | Status: DC
  Administered 2019-09-13: 09:00:00 12.5 mg via ORAL
  Filled 2019-09-12: qty 1

## 2019-09-12 MED ORDER — ASPIRIN EC 81 MG PO TBEC
81.0000 mg | DELAYED_RELEASE_TABLET | Freq: Every day | ORAL | Status: DC
  Administered 2019-09-13 – 2019-09-15 (×3): 81 mg via ORAL
  Filled 2019-09-12 (×4): qty 1

## 2019-09-12 MED ORDER — INSULIN ASPART 100 UNIT/ML ~~LOC~~ SOLN
2.0000 [IU] | Freq: Once | SUBCUTANEOUS | Status: AC
  Administered 2019-09-12: 2 [IU] via INTRAVENOUS

## 2019-09-12 MED ORDER — DEXTROSE-NACL 5-0.45 % IV SOLN
INTRAVENOUS | Status: DC
  Administered 2019-09-12 – 2019-09-14 (×4): via INTRAVENOUS

## 2019-09-12 MED ORDER — MOMETASONE FURO-FORMOTEROL FUM 100-5 MCG/ACT IN AERO
2.0000 | INHALATION_SPRAY | Freq: Two times a day (BID) | RESPIRATORY_TRACT | Status: DC
  Administered 2019-09-13 (×2): 2 via RESPIRATORY_TRACT
  Filled 2019-09-12: qty 8.8

## 2019-09-12 MED ORDER — ENOXAPARIN SODIUM 40 MG/0.4ML ~~LOC~~ SOLN: 40.0000 mg | SUBCUTANEOUS | Status: DC

## 2019-09-12 MED ORDER — MAGNESIUM OXIDE 400 (241.3 MG) MG PO TABS
400.0000 mg | ORAL_TABLET | Freq: Two times a day (BID) | ORAL | Status: DC
  Administered 2019-09-12 – 2019-09-15 (×6): 400 mg via ORAL
  Filled 2019-09-12 (×6): qty 1

## 2019-09-12 MED ORDER — HYPROMELLOSE (GONIOSCOPIC) 2.5 % OP SOLN: 1.0000 [drp] | OPHTHALMIC | Status: DC | PRN

## 2019-09-12 NOTE — ED Provider Notes (Signed)
[facility] EMERGENCY DEPARTMENT Provider Note   CSN: [id number] Arrival date & time: 09/12/19  1128     History   Chief Complaint Chief Complaint  Patient presents with   Hyperglycemia   Emesis    HPI Wanda Hicks is a 58 y.o. female.  Wanda Hicks is a 58 y.o. female with past medical history significant for chronic diastolic heart failure, type 1 diabetes on insulin pump, GERD, heart murmur, hypertension presents for evaluation of hyperglycemia.  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  Patient states she normally goes into DKA with blood sugars greater than 160.  Her blood sugar PCP office was 260 which to give her 4 units of Humalog as well as fluids and Zofran.  Denies chest pain, shortness of breath, abdominal pain, diarrhea, dysuria, diaphoresis.  Patient states she "I do not feel well I feel I can going to DKA."  Apparently patient had systolic blood pressure in the 22G with EMS, systolic blood pressure is 130 here on evaluation.  She denies any additional aggravating or alleviating factors.  History obtained from patient and past medical history. No interpretor was used.  PCP- [facility]     HPI  Past Medical History:  Diagnosis Date   Arthritis    Bronchitis    Chronic diastolic heart failure ([place]) 10/19/2014   Diabetes mellitus without complication (HCC)    Type 1; pt has insulin pump   Family history of adverse reaction to anesthesia    Patients mother has N/V   GERD (gastroesophageal reflux disease)    Heart murmur    Hypertension    Pneumonia    hx of   Positive PPD    Shortness of breath dyspnea    uses Albuterol inhaler PRN    Patient Active Problem List   Diagnosis Date Noted   Panniculitis 09/09/2019   Encounter for long-term (current) use of medications 10/24/2016   Spondylolisthesis of lumbar region 07/10/2016   Chronic diastolic heart failure  ([place]) 10/19/2014   Abnormal ECG 09/14/2014   Dizziness 09/14/2014   HTN (hypertension) 09/14/2014   DM type 1 causing vascular disease ([place]) 09/14/2014   Murmur 09/14/2014   Ankylosing spondylitis ([place]) 09/14/2014   Hypercholesteremia 09/14/2014    Past Surgical History:  Procedure Laterality Date   CARPAL TUNNEL RELEASE Bilateral    CERVICAL DISCECTOMY     X 2   CESAREAN SECTION     COLONOSCOPY     EYE SURGERY     as a child   MAXIMUM ACCESS (MAS)POSTERIOR LUMBAR INTERBODY FUSION (PLIF) 1 LEVEL N/A 07/10/2016   Procedure: Lumbar four-five Maximum access posterior lumbar interbody fusion with resection of synovial cyst;  Surgeon: Erline Levine, MD;  Location: [facility];  Service: Neurosurgery;  Laterality: N/A;   TRIGGER FINGER RELEASE Bilateral    ULNAR NERVE REPAIR Bilateral      OB History   No obstetric history on file.      Home Medications    Prior to Admission medications   Medication Sig Start Date End Date Taking? Authorizing Provider  acetaminophen (TYLENOL) 500 MG tablet Take 1,000 mg by mouth 2 (two) times daily.   Yes [provider]  ADVAIR DISKUS 100-50 MCG/DOSE AEPB Inhale 1 puff into the lungs daily as needed.  08/19/14  Yes [provider]  albuterol (PROVENTIL HFA;VENTOLIN HFA) 108 (90 Base) MCG/ACT inhaler Inhale 1 puff into  the lungs every 6 (six) hours as needed for wheezing or shortness of breath.   Yes [provider]  aspirin 81 MG EC tablet Take 81 mg by mouth daily.    Yes [provider]  etodolac (LODINE) 400 MG tablet Take 400 mg by mouth at bedtime as needed for mild pain.  03/11/17  Yes [provider]  fluticasone (FLONASE) 50 MCG/ACT nasal spray Place 1 spray into both nostrils daily.   Yes [provider]  hydrochlorothiazide (MICROZIDE) 12.5 MG capsule TAKE ONE CAPSULE BY MOUTH DAILY Patient taking differently: Take 12.5 mg by mouth daily.  05/11/19  Yes Croitoru, Mihai, MD    hydroxypropyl methylcellulose / hypromellose (ISOPTO TEARS / GONIOVISC) 2.5 % ophthalmic solution Place 1 drop into both eyes as needed for dry eyes.   Yes [provider]  Insulin Human (INSULIN PUMP) SOLN Inject into the skin.   Yes [provider]  JARDIANCE 10 MG TABS tablet Take 10 mg by mouth daily. 05/22/16  Yes [provider]  KRILL OIL PO Take 1 tablet by mouth daily.    Yes [provider]  levothyroxine (SYNTHROID) 125 MCG tablet Take 125 mcg by mouth daily before breakfast.  08/12/19  Yes [provider]  losartan (COZAAR) 25 MG tablet TAKE THREE TABLETS BY MOUTH DAILY Patient taking differently: Take 50 mg by mouth daily.  08/15/19  Yes Croitoru, Mihai, MD  magnesium oxide (MAG-OX) 400 MG tablet Take 400 mg by mouth 2 (two) times daily.    Yes [provider]  metoprolol succinate (TOPROL XL) 25 MG 24 hr tablet Take 1 tablet (25 mg total) by mouth daily. Patient taking differently: Take 12.5 mg by mouth daily.  11/15/18  Yes Croitoru, Mihai, MD  Multiple Vitamin (MULTIVITAMIN) tablet Take 1 tablet by mouth daily.   Yes [provider]  Multiple Vitamins-Minerals (ICAPS AREDS 2) CAPS Take 1 tablet by mouth 2 (two) times daily.    Yes [provider]  NOVOLOG 100 UNIT/ML injection Inject into the skin daily.  08/15/14  Yes [provider]  PRILOSEC OTC 20 MG tablet Take 1 tablet by mouth daily. 08/28/14  Yes [provider]  rosuvastatin (CRESTOR) 40 MG tablet Take 40 mg by mouth daily. 09/05/19  Yes [provider]  Turmeric 500 MG CAPS Take 500 mg by mouth daily.   Yes [provider]  ACCU-CHEK AVIVA PLUS test strip  09/13/14   [provider]  ACCU-CHEK FASTCLIX LANCETS MISC  08/05/14   [provider]  rosuvastatin (CRESTOR) 20 MG tablet Take 1 tablet (20 mg total) by mouth daily. Patient not taking: Reported on 09/12/2019 03/12/15   Croitoru, Rachelle Hora, MD  traMADol  (ULTRAM) 50 MG tablet Take 1 tablet (50 mg total) by mouth every 6 (six) hours as needed for moderate pain. Patient not taking: Reported on 09/12/2019 07/11/16   Tia Alert, MD    Family History Family History  Problem Relation Age of Onset   Hypertension Mother    Alcoholism Father    Cancer Maternal Grandfather    Stroke Paternal Grandfather     Social History Social History   Tobacco Use   Smoking status: Never Smoker   Smokeless tobacco: Never Used  Substance Use Topics   Alcohol use: No   Drug use: No     Allergies   No known allergies and Latex   Review of Systems Review of Systems  Constitutional: Positive for appetite change. Negative  for activity change, chills, diaphoresis, fatigue, fever and unexpected weight change.  HENT: Negative.   Respiratory: Negative.   Cardiovascular: Negative.   Gastrointestinal: Positive for nausea and vomiting. Negative for abdominal distention, abdominal pain, anal bleeding, blood in stool, constipation, diarrhea and rectal pain.  Genitourinary: Negative.   Musculoskeletal: Negative.   Skin: Negative.   Neurological: Positive for weakness (Generalized). Negative for dizziness, tremors, seizures, facial asymmetry, speech difficulty, light-headedness, numbness and headaches.  All other systems reviewed and are negative.   Physical Exam Updated Vital Signs BP (!) 120/55    Pulse 98    Temp 97.9 F (36.6 C) (Axillary)    Resp (!) 25    Ht  (1.626 m)    Wt 63.5 kg    SpO2 92%    BMI 24.03 kg/m   Physical Exam Vitals signs and nursing note reviewed.  Constitutional:      General: She is not in acute distress.    Appearance: She is well-developed. She is not ill-appearing, toxic-appearing or diaphoretic.  HENT:     Head: Normocephalic and atraumatic.     Nose: Nose normal.     Mouth/Throat:     Mouth: Mucous membranes are dry.  Eyes:     Pupils: Pupils are equal, round, and reactive to light.     Comments: No  horizontal, vertical or rotational nystagmus   Neck:     Musculoskeletal: Normal range of motion.  Cardiovascular:     Rate and Rhythm: Normal rate.     Pulses: Normal pulses.     Heart sounds: Normal heart sounds.  Pulmonary:     Effort: Pulmonary effort is normal. No respiratory distress.     Breath sounds: Normal breath sounds.  Abdominal:     General: Bowel sounds are normal. There is no distension.     Tenderness: There is no abdominal tenderness. There is no right CVA tenderness, left CVA tenderness or guarding.  Musculoskeletal: Normal range of motion.  Skin:    General: Skin is warm and dry.     Capillary Refill: Capillary refill takes less than 2 seconds.  Neurological:     Mental Status: She is alert.     Comments: Mental Status:  Alert, oriented, thought content appropriate. Speech fluent without evidence of aphasia. Able to follow 2 step commands without difficulty.  Cranial Nerves:  II:  Peripheral visual fields grossly normal, pupils equal, round, reactive to light III,IV, VI: ptosis not present, extra-ocular motions intact bilaterally  V,VII: smile symmetric, facial light touch sensation equal VIII: hearing grossly normal bilaterally  IX,X: midline uvula rise  XI: bilateral shoulder shrug equal and strong XII: midline tongue extension  Motor:  5/5 in upper and lower extremities bilaterally including strong and equal grip strength and dorsiflexion/plantar flexion Sensory: Pinprick and light touch normal in all extremities.  Deep Tendon Reflexes: 2+ and symmetric  Cerebellar: normal finger-to-nose with bilateral upper extremities Gait: normal gait and balance CV: distal pulses palpable throughout      ED Treatments / Results  Labs (all labs ordered are listed, but only abnormal results are displayed) Labs Reviewed  BASIC METABOLIC PANEL - Abnormal; Notable for the following components:      Result Value   CO2 18 (*)    Glucose, Bld 217 (*)    BUN 21 (*)     Creatinine, Ser 1.06 (*)    GFR calc non Af Amer 57 (*)    Anion gap 20 (*)  All other components within normal limits  URINALYSIS, ROUTINE W REFLEX MICROSCOPIC - Abnormal; Notable for the following components:   Glucose, UA >=500 (*)    Ketones, ur 80 (*)    All other components within normal limits  BETA-HYDROXYBUTYRIC ACID - Abnormal; Notable for the following components:   Beta-Hydroxybutyric Acid 4.46 (*)    All other components within normal limits  CBG MONITORING, ED - Abnormal; Notable for the following components:   Glucose-Capillary 185 (*)    All other components within normal limits  POCT I-STAT EG7 - Abnormal; Notable for the following components:   pH, Ven 7.485 (*)    pCO2, Ven 27.1 (*)    pO2, Ven 121.0 (*)    TCO2 21 (*)    Calcium, Ion 1.06 (*)    All other components within normal limits  CBG MONITORING, ED - Abnormal; Notable for the following components:   Glucose-Capillary 145 (*)    All other components within normal limits  SARS CORONAVIRUS 2 (TAT 6-24 HRS)  CBC  LACTIC ACID, PLASMA  LACTIC ACID, PLASMA  I-STAT VENOUS BLOOD GAS, ED  TROPONIN I (HIGH SENSITIVITY)  TROPONIN I (HIGH SENSITIVITY)    EKG EKG Interpretation  Date/Time:  Monday September 12 2019 15:50:53 EDT Ventricular Rate:  95 PR Interval:    QRS Duration: 86 QT Interval:  356 QTC Calculation: 448 R Axis:   22 Text Interpretation:  Sinus rhythm Anteroseptal infarct, old Repol abnrm, severe global ischemia (LM/MVD) similar to prior ecg Confirmed by Gerhard MunchLockwood, Robert [phone]([id number]) on 09/12/2019 3:55:28 PM   Radiology Dg Chest 2 View  Result Date: 09/12/2019 CLINICAL DATA:  Shortness of breath and chest pain EXAM: CHEST - 2 VIEW COMPARISON:  September 11, 2007 FINDINGS: Lungs are clear. Heart size and pulmonary vascularity are normal. No adenopathy. No pneumothorax. There is postoperative change in the lower cervical region. There are foci of degenerative change in the thoracic spine.  IMPRESSION: No edema or consolidation. Electronically Signed   By: Bretta BangWilliam  Woodruff III M.D.   On: 09/12/2019 12:55   Ct Angio Chest/abd/pel For Dissection W And/or W/wo  Result Date: 09/12/2019 CLINICAL DATA:  Hypotension EXAM: CT ANGIOGRAPHY CHEST, ABDOMEN AND PELVIS TECHNIQUE: Multidetector CT imaging through the chest, abdomen and pelvis was performed using the standard protocol during bolus administration of intravenous contrast. Multiplanar reconstructed images and MIPs were obtained and reviewed to evaluate the vascular anatomy. CONTRAST:  100mL OMNIPAQUE IOHEXOL 350 MG/ML SOLN COMPARISON:  None. FINDINGS: CTA CHEST FINDINGS Cardiovascular: Preferential opacification of the thoracic aorta. Minimal thoracic aortic atherosclerosis. Normal contour and caliber of the aorta without evidence of aneurysm, dissection, or other acute aortic pathology. 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 Normal heart size. Scattered three-vessel coronary artery calcifications, including calcification near the ostium of the left main coronary artery (series 7, image 63). No pericardial effusion. Mediastinum/Nodes: No enlarged mediastinal, hilar, or axillary lymph nodes. Thyroid gland, trachea, and esophagus demonstrate no significant findings. Lungs/Pleura: Diffuse bilateral interlobular septal thickening, and peribronchovascular thickening. Trace bilateral pleural effusions. Musculoskeletal: Anasarca. No acute or significant osseous findings. Review of the MIP images confirms the above findings. Review of the MIP images confirms the above findings. CTA ABDOMEN AND PELVIS FINDINGS VASCULAR Moderate mixed calcific atherosclerosis of the aorta. No evidence of aneurysm, dissection, or other acute aortic pathology. Incidental small accessory superior pole right renal artery otherwise standard branching pattern of the aorta. Review of the MIP images confirms the above findings.  NON-VASCULAR  Hepatobiliary: No solid liver abnormality is seen. No gallstones, gallbladder wall thickening, or biliary dilatation. Pancreas: Unremarkable. No pancreatic ductal dilatation or surrounding inflammatory changes. Spleen: Normal in size without significant abnormality. Adrenals/Urinary Tract: Adrenal glands are unremarkable. Kidneys are normal, without renal calculi, solid lesion, or hydronephrosis. Bladder is unremarkable. Stomach/Bowel: Stomach is within normal limits. Appendix appears normal. No evidence of bowel wall thickening, distention, or inflammatory changes. Lymphatic: No enlarged abdominal or pelvic lymph nodes. Reproductive: Fat containing bilateral ovarian dermoids, measuring 5.4 cm on the right and 3.9 cm on the left (series 7, image 349). Other: No abdominal wall hernia or abnormality. Anasarca. No abdominopelvic ascites. Musculoskeletal: No acute or significant osseous findings. Review of the MIP images confirms the above findings. IMPRESSION: 1. Normal contour and caliber of the thoracic and abdominal aorta without evidence of dissection, aneurysm, or other acute aortic pathology. Aortic Atherosclerosis (ICD10-I70.0). 2.  Pulmonary edema. 3. Scattered three-vessel coronary artery calcifications, including calcification near the ostium of the left main coronary artery (series 7, image 63). 4.  Anasarca. 5. Fat containing bilateral ovarian dermoids, measuring 5.4 cm on the right and 3.9 cm on the left (series 7, image 349). Electronically Signed   By: Lauralyn Primes M.D.   On: 09/12/2019 15:47   Transthoracic Echocardiography  Patient: Racquel, Arkin MR #: [id number] Study Date: 03/01/2018 Gender: F Age: 45 Height: 162.6 cm Weight: 96.2 kg BSA: 2.13 m^2 Pt. Status: Room:  ATTENDING Kristeen Miss, M.D. SONOGRAPHER Junious Dresser, RDCS REFERRING [facility] [id number] PERFORMING [facility], Outpatient ORDERING Olevia Perches REFERRING  Mcneil Sober S  cc: ------------------------------------------------------------------- LV EF: 75% - 80% Procedures .Critical Care Performed by: Linwood Dibbles, PA-C Authorized by: Linwood Dibbles, PA-C   Critical care provider statement:    Critical care time (minutes):  61   Critical care was necessary to treat or prevent imminent or life-threatening deterioration of the following conditions:  Circulatory failure and metabolic crisis (HYpotension requiring 3 L IVF, DKA)   Critical care was time spent personally by me on the following activities:  Discussions with consultants, evaluation of patient's response to treatment, examination of patient, ordering and performing treatments and interventions, ordering and review of laboratory studies, ordering and review of radiographic studies, pulse oximetry, re-evaluation of patient's condition, obtaining history from patient or surrogate and review of old charts   (including critical care time)  Medications Ordered in ED Medications  insulin regular, human (MYXREDLIN) 100 units/ 100 mL infusion (has no administration in time range)  ondansetron (ZOFRAN) injection 4 mg (has no administration in time range)  dextrose 5 %-0.45 % sodium chloride infusion (has no administration in time range)  0.9 %  sodium chloride infusion (has no administration in time range)  sodium chloride 0.9 % bolus 1,000 mL (0 mLs Intravenous Stopped 09/12/19 1453)  sodium chloride 0.9 % bolus 1,000 mL (0 mLs Intravenous Stopped 09/12/19 1453)  promethazine (PHENERGAN) injection 12.5 mg (12.5 mg Intravenous Given 09/12/19 1253)  insulin aspart (novoLOG) injection 2 Units (2 Units Intravenous Given 09/12/19 1307)  sodium chloride 0.9 % bolus 1,000 mL (1,000 mLs Intravenous New Bag/Given 09/12/19 1438)  iohexol (OMNIPAQUE) 350 MG/ML injection 100 mL (100 mLs Intravenous Contrast Given 09/12/19 1512)     Initial Impression / Assessment and Plan / ED Course  I have  reviewed the triage vital signs and the nursing notes.  Pertinent labs & imaging results that were available during my care of the patient were reviewed by me and  considered in my medical decision making (see chart for details).  58 year old female appears otherwise well presents for evaluation of hyperglycemia and emesis.  Brittle DM type 1. Given 4 units Humalog by PCP office as well as 500 IV fluids and Zofran.  EMS systolic blood pressure in 80 however 130 here.  Repeat vital signs here notes patient to be hypotensive with systolic in the 70s.  2 L IV fluid ordered.  Last echo with EF 75 to 80%. EKG with defuse ST depressions throughout. Denies CP, SOB. Will add trop to DKA labs. States patient goes into DKA at around 160 CBG.  She is afebrile, nonseptic appearing.  Labs and imaging personally reviewed CBC without leukocytosis BMP with elevated glucose at 217, CO2 at 18, anion gap at 20, report no salicylate use Trop negative, delta pending EG7 with ph at 7.485, normal bicarb... No evidence of acidosis.  Repeat CBG with labs at glucose of 217 after 1 L fluids. Will order small dose of insulin.  1433: Repeat BP 119/60 in room.  Improvement in blood pressures with fluids however becomes hypotensive will order CTA dissection to r/o cardiac/AP pathology as cause of her hypotension.  Patient repeat CBG at 149 from patient personal monitor. She does not want repeat finger stick. Discussed with patient repeat documentable CBG. Agreeable at this time.  Repeat CBG 145. BP with significant improvement to 120/55.  Elevated Beta Hydrox--- Likley DKA will order insulin drip with glucose stabilizer to close gap and admit to hospitalist for continued observation.  Repeat EKG without new changes. Delta trop pending.  1615: Consulted with Dr. Luberta Robertson with [facility] who will evaluation patient for admission. Recommend lactic acid and cardiology consult given diffuse ST depression.  1620:  Consulted with Jeraldine Loots NP with Cardiology who will evaluate patient for new EKG changes. Low suspicion for sepsis or sirs as cause of hypotension. Patient continues to deny CP, SOB.  The patient appears reasonably stabilized for admission considering the current resources, flow, and capabilities available in the ED at this time, and I doubt any other [facility] requiring further screening and/or treatment in the ED prior to admission.  Patient has been seen and evaluated by attending physician, Dr. Particia Nearing who agrees with the treatment, plan and disposition.       Final Clinical Impressions(s) / ED Diagnoses   Final diagnoses:  Dehydration  Hypotension, unspecified hypotension type  Hyperglycemia    ED Discharge Orders    None       Kavina Cantave A, PA-C 09/12/19 1628    Jacalyn Lefevre, MD 09/16/19 1234

## 2019-09-12 NOTE — H&P (Signed)
History and Physical:    Wanda Hicks   HKV:[id number] DOB: 02-03-1961 DOA: 09/12/2019  Referring MD/provider: PA Lowanda Foster PCP: Geoffry Paradise, MD   Patient coming from: Home  Chief Complaint: DKA  History of Present Illness:   Wanda Hicks is an 58 y.o. female with past medical history significant for type 1 diabetes for many years on an insulin pump.  States she was in her usual state of health until 3 to 4 days prior to admission when she realized that she was somewhat hypotensive and bradycardic.  She notes that she normally runs with a heart rate 80s and 90s but she dropped down to the 50s for no clear reason.  She states she cut down on her losartan and beta-blocker because of the blood pressure and bradycardia.    She states as of this morning she developed DKA.  She notes that when she goes into DKA she starts "Kussmaul breathing".  She also developed nausea and vomiting multiple times today.  Patient is very familiar with her diabetes and states that she often goes into DKA when her glucose reaches 190.  Of note she has had recent difficulty getting her insulin delivered to her properly because she is having difficulty with her insulin pump getting kinked in her skin.  She apparently has excess skin now due to significant weight loss recently.  Denies fevers or chills or malaise.  No cough or shortness of breath although she states "my breathing was fast because I was Kussmaul breathing".  Denies any chest pain at all.  No exertional DOE.  No orthopnea or PND.  ED Course:  The patient is noted to be hypotensive with systolic in the 70s.  He was also noted to have a metabolic acidosis with an anion gap of 20 and a beta hydroxybutyrate at 4.5.  She was treated with aggressive fluid resuscitation with improvement in her blood pressure.  She was started on glucose stabilizer.  EKG showed diffuse ST depressions and she was seen by cardiology Dr. Royann Shivers who is also her outpatient  cardiologist.  ROS:   ROS   Review of Systems: General: No fever, chills, weight changes Skin: No rashes, lesions, wounds Respiratory: [place],, shortness of breath, hemoptysis Cardiovascular: No palpitations, chest pain GI: No nausea, vomiting, diarrhea, constipation GU: No dysuria, increased frequency CNS: No numbness, dizziness, headache Blood/lymphatics: No easy bruising, bleeding Mood/affect: No anxiety/depression    Past Medical History:   Past Medical History:  Diagnosis Date   Arthritis    Bronchitis    Chronic diastolic heart failure (HCC) 10/19/2014   Diabetes mellitus without complication (HCC)    Type 1; pt has insulin pump   Family history of adverse reaction to anesthesia    Patients mother has N/V   GERD (gastroesophageal reflux disease)    Heart murmur    Hypertension    Pneumonia    hx of   Positive PPD    Shortness of breath dyspnea    uses Albuterol inhaler PRN    Past Surgical History:   Past Surgical History:  Procedure Laterality Date   CARPAL TUNNEL RELEASE Bilateral    CERVICAL DISCECTOMY     X 2   CESAREAN SECTION     COLONOSCOPY     EYE SURGERY     as a child   MAXIMUM ACCESS (MAS)POSTERIOR LUMBAR INTERBODY FUSION (PLIF) 1 LEVEL N/A 07/10/2016   Procedure: Lumbar four-five Maximum access posterior lumbar interbody fusion with resection of  synovial cyst;  Surgeon: Erline Levine, MD;  Location: [facility];  Service: Neurosurgery;  Laterality: N/A;   TRIGGER FINGER RELEASE Bilateral    ULNAR NERVE REPAIR Bilateral     Social History:   Social History   Socioeconomic History   Marital status: Married    Spouse name: Not on file   Number of children: Not on file   Years of education: Not on file   Highest education level: Not on file  Occupational History   Not on file  Social Needs   Financial resource strain: Not on file   Food insecurity    Worry: Not on file    Inability: Not on file    Transportation needs    Medical: Not on file    Non-medical: Not on file  Tobacco Use   Smoking status: Never Smoker   Smokeless tobacco: Never Used  Substance and Sexual Activity   Alcohol use: No   Drug use: No   Sexual activity: Not on file  Lifestyle   Physical activity    Days per week: Not on file    Minutes per session: Not on file   Stress: Not on file  Relationships   Social connections    Talks on phone: Not on file    Gets together: Not on file    Attends religious service: Not on file    Active member of club or organization: Not on file    Attends meetings of clubs or organizations: Not on file    Relationship status: Not on file   Intimate partner violence    Fear of current or ex partner: Not on file    Emotionally abused: Not on file    Physically abused: Not on file    Forced sexual activity: Not on file  Other Topics Concern   Not on file  Social History Narrative   Not on file    Allergies   No known allergies and Latex  Family history:   Family History  Problem Relation Age of Onset   Hypertension Mother    Alcoholism Father    Cancer Maternal Grandfather    Stroke Paternal Grandfather     Current Medications:   Prior to Admission medications   Medication Sig Start Date End Date Taking? Authorizing Provider  acetaminophen (TYLENOL) 500 MG tablet Take 1,000 mg by mouth 2 (two) times daily.   Yes [provider]  ADVAIR DISKUS 100-50 MCG/DOSE AEPB Inhale 1 puff into the lungs daily as needed.  08/19/14  Yes [provider]  albuterol (PROVENTIL HFA;VENTOLIN HFA) 108 (90 Base) MCG/ACT inhaler Inhale 1 puff into the lungs every 6 (six) hours as needed for wheezing or shortness of breath.   Yes [provider]  aspirin 81 MG EC tablet Take 81 mg by mouth daily.    Yes [provider]  etodolac (LODINE) 400 MG tablet Take 400 mg by mouth at bedtime as needed for mild pain.  03/11/17  Yes [provider]  fluticasone (FLONASE) 50 MCG/ACT nasal spray Place 1 spray into both nostrils daily.   Yes [provider]  hydrochlorothiazide (MICROZIDE) 12.5 MG capsule TAKE ONE CAPSULE BY MOUTH DAILY Patient taking differently: Take 12.5 mg by mouth daily.  05/11/19  Yes Croitoru, Mihai, MD  hydroxypropyl methylcellulose / hypromellose (ISOPTO TEARS / GONIOVISC) 2.5 % ophthalmic solution Place 1 drop into both eyes as needed for dry eyes.   Yes [provider]  Insulin Human (  INSULIN PUMP) SOLN Inject into the skin.   Yes [provider]  JARDIANCE 10 MG TABS tablet Take 10 mg by mouth daily. 05/22/16  Yes [provider]  KRILL OIL PO Take 1 tablet by mouth daily.    Yes [provider]  levothyroxine (SYNTHROID) 125 MCG tablet Take 125 mcg by mouth daily before breakfast.  08/12/19  Yes [provider]  losartan (COZAAR) 25 MG tablet TAKE THREE TABLETS BY MOUTH DAILY Patient taking differently: Take 50 mg by mouth daily.  08/15/19  Yes Croitoru, Mihai, MD  magnesium oxide (MAG-OX) 400 MG tablet Take 400 mg by mouth 2 (two) times daily.    Yes [provider]  metoprolol succinate (TOPROL XL) 25 MG 24 hr tablet Take 1 tablet (25 mg total) by mouth daily. Patient taking differently: Take 12.5 mg by mouth daily.  11/15/18  Yes Croitoru, Mihai, MD  Multiple Vitamin (MULTIVITAMIN) tablet Take 1 tablet by mouth daily.   Yes [provider]  Multiple Vitamins-Minerals (ICAPS AREDS 2) CAPS Take 1 tablet by mouth 2 (two) times daily.    Yes [provider]  NOVOLOG 100 UNIT/ML injection Inject into the skin daily.  08/15/14  Yes [provider]  PRILOSEC OTC 20 MG tablet Take 1 tablet by mouth daily. 08/28/14  Yes [provider]  rosuvastatin (CRESTOR) 40 MG tablet Take 40 mg by mouth daily. 09/05/19  Yes [provider]  Turmeric 500 MG CAPS Take 500 mg by mouth daily.   Yes [provider]    ACCU-CHEK AVIVA PLUS test strip  09/13/14   [provider]  ACCU-CHEK FASTCLIX LANCETS MISC  08/05/14   [provider]  rosuvastatin (CRESTOR) 20 MG tablet Take 1 tablet (20 mg total) by mouth daily. Patient not taking: Reported on 09/12/2019 03/12/15   Croitoru, Rachelle Hora, MD  traMADol (ULTRAM) 50 MG tablet Take 1 tablet (50 mg total) by mouth every 6 (six) hours as needed for moderate pain. Patient not taking: Reported on 09/12/2019 07/11/16   Tia Alert, MD    Physical Exam:   Vitals:   09/12/19 1545 09/12/19 1630 09/12/19 1715 09/12/19 1800  BP: (!) 120/55 (!) 104/42 (!) 96/46 (!) 99/48  Pulse: 97 (!) 108 (!) 105 (!) 105  Resp: (!) 28 (!) 29 (!) 27 (!) 26  Temp:      TempSrc:      SpO2: 92% 92% (!) 89% 91%  Weight:      Height:         Physical Exam: Blood pressure (!) 99/48, pulse (!) 105, temperature 97.9 F (36.6 C), temperature source Axillary, resp. rate (!) 26, height  (1.626 m), weight 63.5 kg, SpO2 91 %. Gen: Friendly well-appearing female lying flat in bed in no acute respiratory distress.  Does have persistent deep breathing consistent with Kussmaul breathing. Eyes: Sclerae anicteric. Conjunctiva mildly injected. Chest: Moderately good air entry bilaterally with no adventitious sounds.  CV: Distant, regular, no audible murmurs. Abdomen: NABS, soft, nondistended, nontender. No tenderness to light or deep palpation. No rebound, no guarding. Extremities: No edema.  Skin: Warm and dry. No rashes, lesions or wounds. Neuro: Alert and oriented times 3; grossly nonfocal. Psych: Patient is cooperative, logical and coherent with appropriate mood and affect.  Data Review:    Labs: Basic Metabolic Panel: Recent Labs  Lab 09/12/19 1149 09/12/19 1247  NA 138 136  K 4.2 4.1  CL 100  --   CO2 18*  --  GLUCOSE 217*  --   BUN 21*  --   CREATININE 1.06*  --   CALCIUM 9.5  --    Liver Function Tests: No results for input(s): AST, ALT, ALKPHOS,  BILITOT, PROT, ALBUMIN in the last 168 hours. No results for input(s): LIPASE, AMYLASE in the last 168 hours. No results for input(s): AMMONIA in the last 168 hours. CBC: Recent Labs  Lab 09/12/19 1149 09/12/19 1247  WBC 10.5  --   HGB 14.4 14.6  HCT 42.6 43.0  MCV 95.3  --   PLT 233  --    Cardiac Enzymes: No results for input(s): CKTOTAL, CKMB, CKMBINDEX, TROPONINI in the last 168 hours.  BNP (last 3 results) No results for input(s): PROBNP in the last 8760 hours. CBG: Recent Labs  Lab 09/12/19 1145 09/12/19 1537 09/12/19 1744  GLUCAP 185* 145* 131*    Urinalysis    Component Value Date/Time   COLORURINE YELLOW 09/12/2019 1455   APPEARANCEUR CLEAR 09/12/2019 1455   LABSPEC 1.021 09/12/2019 1455   PHURINE 5.0 09/12/2019 1455   GLUCOSEU >=500 (A) 09/12/2019 1455   HGBUR NEGATIVE 09/12/2019 1455   BILIRUBINUR NEGATIVE 09/12/2019 1455   KETONESUR 80 (A) 09/12/2019 1455   PROTEINUR NEGATIVE 09/12/2019 1455   NITRITE NEGATIVE 09/12/2019 1455   LEUKOCYTESUR NEGATIVE 09/12/2019 1455      Radiographic Studies: Dg Chest 2 View  Result Date: 09/12/2019 CLINICAL DATA:  Shortness of breath and chest pain EXAM: CHEST - 2 VIEW COMPARISON:  September 11, 2007 FINDINGS: Lungs are clear. Heart size and pulmonary vascularity are normal. No adenopathy. No pneumothorax. There is postoperative change in the lower cervical region. There are foci of degenerative change in the thoracic spine. IMPRESSION: No edema or consolidation. Electronically Signed   By: Bretta BangWilliam  Woodruff III M.D.   On: 09/12/2019 12:55   Ct Angio Chest/abd/pel For Dissection W And/or W/wo  Result Date: 09/12/2019 CLINICAL DATA:  Hypotension EXAM: CT ANGIOGRAPHY CHEST, ABDOMEN AND PELVIS TECHNIQUE: Multidetector CT imaging through the chest, abdomen and pelvis was performed using the standard protocol during bolus administration of intravenous contrast. Multiplanar reconstructed images and MIPs were obtained and  reviewed to evaluate the vascular anatomy. CONTRAST:  100mL OMNIPAQUE IOHEXOL 350 MG/ML SOLN COMPARISON:  None. FINDINGS: CTA CHEST FINDINGS Cardiovascular: Preferential opacification of the thoracic aorta. Minimal thoracic aortic atherosclerosis. Normal contour and caliber of the aorta without evidence of aneurysm, dissection, or other acute aortic pathology. 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 Normal heart size. Scattered three-vessel coronary artery calcifications, including calcification near the ostium of the left main coronary artery (series 7, image 63). No pericardial effusion. Mediastinum/Nodes: No enlarged mediastinal, hilar, or axillary lymph nodes. Thyroid gland, trachea, and esophagus demonstrate no significant findings. Lungs/Pleura: Diffuse bilateral interlobular septal thickening, and peribronchovascular thickening. Trace bilateral pleural effusions. Musculoskeletal: Anasarca. No acute or significant osseous findings. Review of the MIP images confirms the above findings. Review of the MIP images confirms the above findings. CTA ABDOMEN AND PELVIS FINDINGS VASCULAR Moderate mixed calcific atherosclerosis of the aorta. No evidence of aneurysm, dissection, or other acute aortic pathology. Incidental small accessory superior pole right renal artery otherwise standard branching pattern of the aorta. Review of the MIP images confirms the above findings. NON-VASCULAR Hepatobiliary: No solid liver abnormality is seen. No gallstones, gallbladder wall thickening, or biliary dilatation. Pancreas: Unremarkable. No pancreatic ductal dilatation or surrounding inflammatory changes. Spleen: Normal in size without significant abnormality. Adrenals/Urinary Tract: Adrenal glands are  unremarkable. Kidneys are normal, without renal calculi, solid lesion, or hydronephrosis. Bladder is unremarkable. Stomach/Bowel: Stomach is within normal limits. Appendix appears normal. No evidence of  bowel wall thickening, distention, or inflammatory changes. Lymphatic: No enlarged abdominal or pelvic lymph nodes. Reproductive: Fat containing bilateral ovarian dermoids, measuring 5.4 cm on the right and 3.9 cm on the left (series 7, image 349). Other: No abdominal wall hernia or abnormality. Anasarca. No abdominopelvic ascites. Musculoskeletal: No acute or significant osseous findings. Review of the MIP images confirms the above findings. IMPRESSION: 1. Normal contour and caliber of the thoracic and abdominal aorta without evidence of dissection, aneurysm, or other acute aortic pathology. Aortic Atherosclerosis (ICD10-I70.0). 2.  Pulmonary edema. 3. Scattered three-vessel coronary artery calcifications, including calcification near the ostium of the left main coronary artery (series 7, image 63). 4.  Anasarca. 5. Fat containing bilateral ovarian dermoids, measuring 5.4 cm on the right and 3.9 cm on the left (series 7, image 349). Electronically Signed   By: Lauralyn Primes M.D.   On: 09/12/2019 15:47    EKG: Independently reviewed.  Sinus rhythm at 95.  Normal intervals.  Normal axis.  2 mm ST depressions in 2.  1 mm ST depressions 3 and F as well as V4-V6.  T wave inversions V5 and V6.  Also bili 1/2 mm ST elevations in V1.   Assessment/Plan:   Principal Problem:   DKA (diabetic ketoacidoses) (HCC) Active Problems:   Abnormal ECG   HTN (hypertension)   Chronic diastolic heart failure (HCC)   COPD (chronic obstructive pulmonary disease) (HCC)   58 year old with advanced type 1 diabetes presents with DKA and relatively low blood sugar.  DKA is most likely secondary to not getting insulin delivered adequately because the tubing to her insulin pump is getting kinked in the excess skin that she has developed due to her weight loss.  Is also noted to have ST depressions inferior laterally on EKG but has no cardiac symptoms.  DKA Will treat with aggressive fluid resuscitation and glucose stabilizer  protocol. Follow potassium and magnesium closely and replete as warranted.  ABNORMAL EKG WITH ST DEPRESSION Most recent troponin has increased from 6-2 09. Patient continues to deny chest pain. Very much appreciate consultation by Dr. Royann Shivers who knows patient very well. He has raised concerns for possible hypertrophic cardiomyopathy and possibly left main disease given relatively diffuse ST depressions and increase although modest, in high-sensitivity troponin low he does not seem to think this is secondary to an acute coronary syndrome. He is also ordered low-dose beta-blocker.  Blood pressure is improving with hydration. Echoardiogram also ordered.  HYPOTENSION Responding well to aggressive fluid resuscitation, will continue. At present she does not appear to have signs or symptoms of cardiogenic shock but will need to follow closely given diffuse T wave depressions  HTN Hold losartan, HCTZ and Toprol-XL as patient is hypotensive at present  HYPOTHYROIDISM Continue Synthroid  COPD Evidence for acute flare To new baseline Dulera PRN duo nebs requested    Other information:   DVT prophylaxis: Lovenox ordered. Code Status: Full code. Family Communication: His husband was at bedside throughout Disposition Plan: home Consults called: cardiology Admission status: inpatient   The medical decision making on this patient was of high complexity and the patient is at high risk for clinical deterioration, therefore this is a level 3 visit.  Horatio Pel Tublu Addy Mcmannis [facility]  If 7PM-7AM, please contact night-coverage [email or web address] Password [facility] 09/12/2019, 6:17 PM

## 2019-09-12 NOTE — ED Notes (Signed)
Pt updated on plan of care, reports that she checked her own blood sugar "just now" with her machine and it was 191. Pt requesting that her hand be warmed and blood sugar be rechecked. Pt given heat pack.

## 2019-09-12 NOTE — ED Notes (Signed)
Pt restarted on glucostabilizer, Dr. Baltazar Najjar aware, no new orders at this time.

## 2019-09-12 NOTE — Consult Note (Signed)
Cardiology Consultation:   Patient ID: Wanda Hicks MRN: [id number]; DOB: 13-Dec-1961  Admit date: 09/12/2019 Date of Consult: 09/12/2019  Primary Care Provider: Burnard Bunting, MD Primary Cardiologist: Sanda Klein, MD  Primary Electrophysiologist:  None    Patient Profile:   Wanda Hicks is a 58 y.o. female with a hx of chronic diastolic heart failure, and, hyperdynamic left ventricular systolic function with murmur, history of asymptomatic Hollenhorst plaques, type 1 diabetes mellitus since age 22 on insulin pump, who is being seen today for the evaluation of abnormal ECG at the request of Dr. Gilford Raid.  History of Present Illness:   Wanda Hicks has had repeated problems with hypoglycemia related to kinking of the insulin administration set.  Today she developed symptomatic hypoglycemia and eventually DKA which led to her emergency room visit.  She was markedly hypotensive, but is improving with intravenous fluids.  Had nausea vomiting and anorexia and feels very weak.  In addition to insulin, she uses Jardiance to reduce her insulin dosage.  She has not had any problems with chest pain but did have some dyspnea at rest/hyperventilation.  She denies palpitations or full-blown syncope.  She has not had focal neurological complaints.  She does not have leg edema or intermittent claudication.  She has not had fever, chills, cough although she has had some whistling sounds in her upper airway she has not had frank wheezing.  Recently, she is noticed substantial weight loss and she was aggressively managing her diet, exercising daily and was engaged in [employer].  She had noticed hypotension several weeks ago and had cut back her losartan to only 50 mg once daily.  She also noted relative bradycardia (heart rate 60, but no symptoms of bradycardia) and reduced her dose of beta-blocker in half.  Labs confirm significant ketosis and hyperglycemia, although she is not frankly acidotic due  to significant respiratory alkalosis.   Previous echocardiogram in March 2019 showed a hyperdynamic left ventricle with cavity obliteration and a high velocity "gradient", although it was not clear whether this was LVOT obstruction.  She had a normal nuclear stress test in 2015.  She had normal carotid Dopplers in 2019.  Heart Pathway Score:     Past Medical History:  Diagnosis Date  . Arthritis   . Bronchitis   . Chronic diastolic heart failure ([place]) 10/19/2014  . Diabetes mellitus without complication (HCC)    Type 1; pt has insulin pump  . Family history of adverse reaction to anesthesia    Wanda Hicks has N/V  . GERD (gastroesophageal reflux disease)   . Heart murmur   . Hypertension   . Pneumonia    hx of  . Positive PPD   . Shortness of breath dyspnea    uses Albuterol inhaler PRN    Past Surgical History:  Procedure Laterality Date  . CARPAL TUNNEL RELEASE Bilateral   . CERVICAL DISCECTOMY     X 2  . CESAREAN SECTION    . COLONOSCOPY    . EYE SURGERY     as a child  . MAXIMUM ACCESS (MAS)POSTERIOR LUMBAR INTERBODY FUSION (PLIF) 1 LEVEL N/A 07/10/2016   Procedure: Lumbar four-five Maximum access posterior lumbar interbody fusion with resection of synovial cyst;  Surgeon: Erline Levine, MD;  Location: [facility];  Service: Neurosurgery;  Laterality: N/A;  . TRIGGER FINGER RELEASE Bilateral   . ULNAR NERVE REPAIR Bilateral      Home Medications:  Prior to Admission medications   Medication Sig  Start Date End Date Taking? Authorizing Provider  acetaminophen (TYLENOL) 500 MG tablet Take 1,000 mg by mouth 2 (two) times daily.   Yes [provider]  ADVAIR DISKUS 100-50 MCG/DOSE AEPB Inhale 1 puff into the lungs daily as needed.  08/19/14  Yes [provider]  albuterol (PROVENTIL HFA;VENTOLIN HFA) 108 (90 Base) MCG/ACT inhaler Inhale 1 puff into the lungs every 6 (six) hours as needed for wheezing or shortness of breath.   Yes [provider]   aspirin 81 MG EC tablet Take 81 mg by mouth daily.    Yes [provider]  etodolac (LODINE) 400 MG tablet Take 400 mg by mouth at bedtime as needed for mild pain.  03/11/17  Yes [provider]  fluticasone (FLONASE) 50 MCG/ACT nasal spray Place 1 spray into both nostrils daily.   Yes [provider]  hydrochlorothiazide (MICROZIDE) 12.5 MG capsule TAKE ONE CAPSULE BY MOUTH DAILY Patient taking differently: Take 12.5 mg by mouth daily.  05/11/19  Yes Harpreet Pompey, MD  hydroxypropyl methylcellulose / hypromellose (ISOPTO TEARS / GONIOVISC) 2.5 % ophthalmic solution Place 1 drop into both eyes as needed for dry eyes.   Yes [provider]  Insulin Human (INSULIN PUMP) SOLN Inject into the skin.   Yes [provider]  JARDIANCE 10 MG TABS tablet Take 10 mg by mouth daily. 05/22/16  Yes [provider]  KRILL OIL PO Take 1 tablet by mouth daily.    Yes [provider]  levothyroxine (SYNTHROID) 125 MCG tablet Take 125 mcg by mouth daily before breakfast.  08/12/19  Yes [provider]  losartan (COZAAR) 25 MG tablet TAKE THREE TABLETS BY MOUTH DAILY Patient taking differently: Take 50 mg by mouth daily.  08/15/19  Yes Mikeya Tomasetti, MD  magnesium oxide (MAG-OX) 400 MG tablet Take 400 mg by mouth 2 (two) times daily.    Yes [provider]  metoprolol succinate (TOPROL XL) 25 MG 24 hr tablet Take 1 tablet (25 mg total) by mouth daily. Patient taking differently: Take 12.5 mg by mouth daily.  11/15/18  Yes Amberlyn Martinezgarcia, MD  Multiple Vitamin (MULTIVITAMIN) tablet Take 1 tablet by mouth daily.   Yes [provider]  Multiple Vitamins-Minerals (ICAPS AREDS 2) CAPS Take 1 tablet by mouth 2 (two) times daily.    Yes [provider]  NOVOLOG 100 UNIT/ML injection Inject into the skin daily.  08/15/14  Yes [provider]  PRILOSEC OTC 20 MG tablet Take 1 tablet by mouth daily. 08/28/14  Yes [provider]  rosuvastatin (CRESTOR) 40 MG tablet Take 40 mg by mouth daily. 09/05/19  Yes [provider]  Turmeric 500 MG CAPS Take 500 mg by mouth daily.   Yes [provider]  ACCU-CHEK AVIVA PLUS test strip  09/13/14   [provider]  ACCU-CHEK FASTCLIX LANCETS MISC  08/05/14   [provider]  rosuvastatin (CRESTOR) 20 MG tablet Take 1 tablet (20 mg total) by mouth daily. Patient not taking: Reported on 09/12/2019 03/12/15   Kaliyan Osbourn, Rachelle Hora, MD  traMADol (ULTRAM) 50 MG tablet Take 1 tablet (50 mg total) by mouth every 6 (six) hours as needed for moderate pain. Patient not taking: Reported on 09/12/2019 07/11/16   Tia Alert, MD    Inpatient Medications: Scheduled Meds:  Continuous Infusions: . sodium chloride 125 mL/hr at 09/12/19 1650  . dextrose 5 % and 0.45% NaCl 125 mL/hr at 09/12/19 1650  . insulin Stopped (09/12/19  1747)   PRN Meds:   Allergies:    Allergies  Allergen Reactions  . No Known Allergies   . Latex Rash    Can tolerate some latex, but if on skin for a long period of time will cause a rash    Social History:   Social History   Socioeconomic History  . Marital status: Married    Spouse name: Not on file  . Number of children: Not on file  . Years of education: Not on file  . Highest education level: Not on file  Occupational History  . Not on file  Social Needs  . Financial resource strain: Not on file  . Food insecurity    Worry: Not on file    Inability: Not on file  . Transportation needs    Medical: Not on file    Non-medical: Not on file  Tobacco Use  . Smoking status: Never Smoker  . Smokeless tobacco: Never Used  Substance and Sexual Activity  . Alcohol use: No  . Drug use: No  . Sexual activity: Not on file  Lifestyle  . Physical activity    Days per week: Not on file    Minutes per session: Not on file  . Stress: Not on file  Relationships  . Social Musician on phone: Not on  file    Gets together: Not on file    Attends religious service: Not on file    Active member of club or organization: Not on file    Attends meetings of clubs or organizations: Not on file    Relationship status: Not on file  . Intimate partner violence    Fear of current or ex partner: Not on file    Emotionally abused: Not on file    Physically abused: Not on file    Forced sexual activity: Not on file  Other Topics Concern  . Not on file  Social History Narrative  . Not on file    Family History:    Family History  Problem Relation Age of Onset  . Hypertension Hicks   . Alcoholism Father   . Cancer Maternal Grandfather   . Stroke Paternal Grandfather      ROS:  Please see the history of present illness.   All other ROS reviewed and negative.     Physical Exam/Data:   Vitals:   09/12/19 1415 09/12/19 1545 09/12/19 1630 09/12/19 1715  BP: (!) 110/46 (!) 120/55 (!) 104/42 (!) 96/46  Pulse: 92 97 (!) 108 (!) 105  Resp: (!) 26 (!) 28 (!) 29 (!) 27  Temp:      TempSrc:      SpO2: 94% 92% 92% (!) 89%  Weight:      Height:        Intake/Output Summary (Last 24 hours) at 09/12/2019 1754 Last data filed at 09/12/2019 1634 Gross per 24 hour  Intake 3000 ml  Output -  Net 3000 ml   Last 3 Weights 09/12/2019 09/09/2019 08/02/2018  Weight (lbs) 140 lb 140 lb 212 lb 6.4 oz  Weight (kg) 63.504 kg 63.504 kg 96.344 kg     Body mass index is 24.03 kg/m.  General:  Well nourished, well developed, in no acute distress.  She appears very weak, tired and a little pale HEENT: normal Lymph: no adenopathy Neck: no JVD Endocrine:  No thryomegaly Vascular: No carotid bruits; FA pulses 2+ bilaterally without bruits  Cardiac:  normal S1, S2; RRR;  she has a loud 3/6 mid peaking systolic ejection murmur heard in the right upper sternal border radiating towards the base but not really heard in her carotids; she also has a holosystolic murmur heard throughout the precordial area  radiating into the axilla.  Both murmurs intensify with a Valsalva maneuver.  There are no diastolic murmurs, rubs or gallops Lungs:  clear to auscultation bilaterally, no wheezing, rhonchi or rales  Abd: soft, nontender, no hepatomegaly  Ext: no edema Musculoskeletal:  No deformities, BUE and BLE strength normal and equal Skin: warm and dry  Neuro:  CNs 2-12 intact, no focal abnormalities noted Psych:  Normal affect   EKG:  The EKG was personally reviewed and demonstrates: Sinus rhythm, QS pattern in leads V1-V2, subtle ST segment elevation in V1 and aVR with diffuse marked downsloping ST segment depression and T wave inversion in all other leads.  Similar repolarization abnormalities were seen on prior tracings, although not quite the same amplitude Telemetry:  Telemetry was personally reviewed and demonstrates: Sinus tachycardia  Relevant CV Studies: 03/01/2018 echocardiogram - Left ventricle: The cavity size was normal. Wall thickness was   increased in a pattern of mild LVH. Systolic function was   vigorous. The estimated ejection fraction was in the range of 75%   to 80%. There was dynamic obstruction during Valsalvaat an   indeterminate location, with mid-cavity obliteration, a peak   velocity of 397 cm/sec, and a peak gradient of 63 mm Hg. Wall   motion was normal; there were no regional wall motion   abnormalities. Doppler parameters are consistent with abnormal   left ventricular relaxation (grade 1 diastolic dysfunction). - Mitral valve: Mildly calcified annulus. Mean gradient (D): 4 mm   Hg. Peak gradient (D): 9 mm Hg. Valve area by pressure half-time:   2.12 cm^2.  Laboratory Data:  High Sensitivity Troponin:   Recent Labs  Lab 09/12/19 1215 09/12/19 1520  TROPONINIHS 6 209*     Chemistry Recent Labs  Lab 09/12/19 1149 09/12/19 1247  NA 138 136  K 4.2 4.1  CL 100  --   CO2 18*  --   GLUCOSE 217*  --   BUN 21*  --   CREATININE 1.06*  --   CALCIUM 9.5  --    GFRNONAA 57*  --   GFRAA >60  --   ANIONGAP 20*  --     No results for input(s): PROT, ALBUMIN, AST, ALT, ALKPHOS, BILITOT in the last 168 hours. Hematology Recent Labs  Lab 09/12/19 1149 09/12/19 1247  WBC 10.5  --   RBC 4.47  --   HGB 14.4 14.6  HCT 42.6 43.0  MCV 95.3  --   MCH 32.2  --   MCHC 33.8  --   RDW 14.0  --   PLT 233  --    BNPNo results for input(s): BNP, PROBNP in the last 168 hours.  DDimer No results for input(s): DDIMER in the last 168 hours.   Radiology/Studies:  Dg Chest 2 View  Result Date: 09/12/2019 CLINICAL DATA:  Shortness of breath and chest pain EXAM: CHEST - 2 VIEW COMPARISON:  September 11, 2007 FINDINGS: Lungs are clear. Heart size and pulmonary vascularity are normal. No adenopathy. No pneumothorax. There is postoperative change in the lower cervical region. There are foci of degenerative change in the thoracic spine. IMPRESSION: No edema or consolidation. Electronically Signed   By: Bretta Bang III M.D.   On: 09/12/2019 12:55   Ct Angio  Chest/abd/pel For Dissection W And/or W/wo  Result Date: 09/12/2019 CLINICAL DATA:  Hypotension EXAM: CT ANGIOGRAPHY CHEST, ABDOMEN AND PELVIS TECHNIQUE: Multidetector CT imaging through the chest, abdomen and pelvis was performed using the standard protocol during bolus administration of intravenous contrast. Multiplanar reconstructed images and MIPs were obtained and reviewed to evaluate the vascular anatomy. CONTRAST:  OMNIPAQUE IOHEXOL 350 MG/ML SOLN COMPARISON:  None. FINDINGS: CTA CHEST FINDINGS Cardiovascular: Preferential opacification of the thoracic aorta. Minimal thoracic aortic atherosclerosis. Normal contour and caliber of the aorta without evidence of aneurysm, dissection, or other acute aortic pathology. 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 Normal heart size. Scattered three-vessel coronary artery calcifications, including calcification near the ostium of the  left main coronary artery (series 7, image 63). No pericardial effusion. Mediastinum/Nodes: No enlarged mediastinal, hilar, or axillary lymph nodes. Thyroid gland, trachea, and esophagus demonstrate no significant findings. Lungs/Pleura: Diffuse bilateral interlobular septal thickening, and peribronchovascular thickening. Trace bilateral pleural effusions. Musculoskeletal: Anasarca. No acute or significant osseous findings. Review of the MIP images confirms the above findings. Review of the MIP images confirms the above findings. CTA ABDOMEN AND PELVIS FINDINGS VASCULAR Moderate mixed calcific atherosclerosis of the aorta. No evidence of aneurysm, dissection, or other acute aortic pathology. Incidental small accessory superior pole right renal artery otherwise standard branching pattern of the aorta. Review of the MIP images confirms the above findings. NON-VASCULAR Hepatobiliary: No solid liver abnormality is seen. No gallstones, gallbladder wall thickening, or biliary dilatation. Pancreas: Unremarkable. No pancreatic ductal dilatation or surrounding inflammatory changes. Spleen: Normal in size without significant abnormality. Adrenals/Urinary Tract: Adrenal glands are unremarkable. Kidneys are normal, without renal calculi, solid lesion, or hydronephrosis. Bladder is unremarkable. Stomach/Bowel: Stomach is within normal limits. Appendix appears normal. No evidence of bowel wall thickening, distention, or inflammatory changes. Lymphatic: No enlarged abdominal or pelvic lymph nodes. Reproductive: Fat containing bilateral ovarian dermoids, measuring 5.4 cm on the right and 3.9 cm on the left (series 7, image 349). Other: No abdominal wall hernia or abnormality. Anasarca. No abdominopelvic ascites. Musculoskeletal: No acute or significant osseous findings. Review of the MIP images confirms the above findings. IMPRESSION: 1. Normal contour and caliber of the thoracic and abdominal aorta without evidence of dissection,  aneurysm, or other acute aortic pathology. Aortic Atherosclerosis (ICD10-I70.0). 2.  Pulmonary edema. 3. Scattered three-vessel coronary artery calcifications, including calcification near the ostium of the left main coronary artery (series 7, image 63). 4.  Anasarca. 5. Fat containing bilateral ovarian dermoids, measuring 5.4 cm on the right and 3.9 cm on the left (series 7, image 349). Electronically Signed   By: Lauralyn Primes M.D.   On: 09/12/2019 15:47    Assessment and Plan:   1. 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  Not sure whether this is a true structural abnormality or just similar pathophysiology in a patient with left ventricular hypertrophy and a very small left ventricular cavity due to hypovolemia.  Her blood pressure is better and she is receiving intravenous fluids.  We will administer a very low-dose of beta-blocker and plan to titrate the dose up.  Recheck her echocardiogram specifically looking for evidence of dynamic LV outflow tract obstruction and mitral insufficiency. 2. Abnormal troponin: The increase in high-sensitivity troponin is clearly significant, but not in the range of atypical acute coronary syndrome.  She does not have any chest discomfort and has not had any chest tightness recently.  Although there is  evidence of calcification in her coronary arteries (including near the ostium of the left main), the overall burden of atherosclerotic calcification is relatively mild for a patient with 54 years of insulin requiring diabetes mellitus.  Obviously, the subtle ST segment elevation changes on her ECG are concerning for global ischemia from left main disease.  Will probably perform a coronary CT angiography after we have achieved metabolic homeostasis.  It is not unreasonable to treat with intravenous heparin while we wait. 3. Chronic diastolic heart failure: Clearly she is not currently hypervolemic, quite to the  contrary she is volume depleted.  Chronic therapy with SGLT2 inhibitors may increase the likelihood that she develops hypovolemia which can worsen the hemodynamic consequences of her cardiac illness.  May need to reconsider the use of these medications, especially in a patient that is so prone to DKA.      For questions or updates, please contact [facility] Please consult [email or web address] for contact info under     Signed, Thurmon FairMihai Malaiyah Achorn, MD  09/12/2019 5:54 PM

## 2019-09-12 NOTE — ED Triage Notes (Signed)
Pt bib ems for hyperglycemia and emesis. Pt states she thinks shes having an insulin pump failure, it keeps reading "kinked" and states she usually goes into DKA when her CBG is greater than 160. CBG 258 with ems. Pt went to PCP office this morning and they gave her 4 units of Humalog at 1253.  EMS gave 542ml fluid, 4 zofran en route.   Bp 83/32 HR 58

## 2019-09-12 NOTE — Progress Notes (Signed)
ANTICOAGULATION CONSULT NOTE - Initial Consult  Pharmacy Consult for Heparin Indication: chest pain/ACS  Allergies  Allergen Reactions  . No Known Allergies   . Latex Rash    Can tolerate some latex, but if on skin for a long period of time will cause a rash    Patient Measurements: Height: 5\' 4"  (162.6 cm) Weight: 140 lb (63.5 kg) IBW/kg (Calculated) : 54.7 Heparin Dosing Weight: 63.5 kg   Vital Signs: Temp: 97.9 F (36.6 C) (09/28 1137) Temp Source: Axillary (09/28 1137) BP: 99/48 (09/28 1800) Pulse Rate: 105 (09/28 1800)  Labs: Recent Labs    09/12/19 1149 09/12/19 1215 09/12/19 1247 09/12/19 1520  HGB 14.4  --  14.6  --   HCT 42.6  --  43.0  --   PLT 233  --   --   --   CREATININE 1.06*  --   --   --   TROPONINIHS  --  6  --  209*    Estimated Creatinine Clearance: 48.7 mL/min (A) (by C-G formula based on SCr of 1.06 mg/dL (H)).   Medical History: Past Medical History:  Diagnosis Date  . Arthritis   . Bronchitis   . Chronic diastolic heart failure ([place]) 10/19/2014  . Diabetes mellitus without complication (HCC)    Type 1; pt has insulin pump  . Family history of adverse reaction to anesthesia    Patients mother has N/V  . GERD (gastroesophageal reflux disease)   . Heart murmur   . Hypertension   . Pneumonia    hx of  . Positive PPD   . Shortness of breath dyspnea    uses Albuterol inhaler PRN    Medications:  Scheduled:  . acetaminophen  1,000 mg Oral BID  . [START ON 09/13/2019] aspirin  81 mg Oral Daily  . fluticasone  1 spray Each Nare Daily  . heparin  3,800 Units Intravenous Once  . [START ON 09/13/2019] levothyroxine  125 mcg Oral QAC breakfast  . magnesium oxide  400 mg Oral BID  . metoprolol tartrate  12.5 mg Oral BID  . mometasone-formoterol  2 puff Inhalation BID  . omeprazole  20 mg Oral Daily   Infusions:  . sodium chloride 150 mL/hr at 09/12/19 1820  . dextrose 5 % and 0.45% NaCl 125 mL/hr at 09/12/19 1820  . heparin    .  insulin Stopped (09/12/19 1851)  . potassium chloride 10 mEq (09/12/19 1837)    Assessment: 58 y.o. female presenting with hyperglycemia and emesis. PMH includes CHF, DM, GERD, heart murmur, HTN. No anticoagulation PTA. EKG with defuse ST depressions. Hgb 14.6, Plts 233, Scr 1.06. Pharmacy consulted for heparin dosing.   Goal of Therapy:  Heparin level 0.3-0.7 units/ml Monitor platelets by anticoagulation protocol: Yes   Plan:  Heparin bolus 3800 units Heparin gtt 800 units/hr Check 6hr HL Daily HL and CBC Monitor s/sx of bleeding  Lorel Monaco, PharmD PGY1 Ambulatory Care Resident [employer] # [phone]

## 2019-09-12 NOTE — ED Notes (Signed)
Pt states she is concerned about her breathing, reports she feels like she has kussmaul respirations. CBG 112, insulin gtt still paused per glucostabilizer. Dr. Jamse Arn paged, verbal order 2mg  ativan PO if needed.

## 2019-09-13 ENCOUNTER — Inpatient Hospital Stay (HOSPITAL_COMMUNITY): Payer: BC Managed Care – PPO

## 2019-09-13 DIAGNOSIS — I361 Nonrheumatic tricuspid (valve) insufficiency: Secondary | ICD-10-CM

## 2019-09-13 DIAGNOSIS — I340 Nonrheumatic mitral (valve) insufficiency: Secondary | ICD-10-CM

## 2019-09-13 DIAGNOSIS — I10 Essential (primary) hypertension: Secondary | ICD-10-CM

## 2019-09-13 DIAGNOSIS — I2510 Atherosclerotic heart disease of native coronary artery without angina pectoris: Secondary | ICD-10-CM

## 2019-09-13 LAB — BASIC METABOLIC PANEL
Anion gap: 9 (ref 5–15)
Anion gap: 14 (ref 5–15)
Anion gap: 9 (ref 5–15)
Anion gap: 7 (ref 5–15)
BUN: 22 mg/dL — ABNORMAL HIGH (ref 6–20)
BUN: 23 mg/dL — ABNORMAL HIGH (ref 6–20)
BUN: 18 mg/dL (ref 6–20)
BUN: 17 mg/dL (ref 6–20)
CO2: 16 mmol/L — ABNORMAL LOW (ref 22–32)
CO2: 13 mmol/L — ABNORMAL LOW (ref 22–32)
CO2: 18 mmol/L — ABNORMAL LOW (ref 22–32)
CO2: 16 mmol/L — ABNORMAL LOW (ref 22–32)
Calcium: 8.3 mg/dL — ABNORMAL LOW (ref 8.9–10.3)
Calcium: 8.1 mg/dL — ABNORMAL LOW (ref 8.9–10.3)
Calcium: 8.6 mg/dL — ABNORMAL LOW (ref 8.9–10.3)
Calcium: 8.5 mg/dL — ABNORMAL LOW (ref 8.9–10.3)
Chloride: 112 mmol/L — ABNORMAL HIGH (ref 98–111)
Chloride: 111 mmol/L (ref 98–111)
Chloride: 114 mmol/L — ABNORMAL HIGH (ref 98–111)
Chloride: 115 mmol/L — ABNORMAL HIGH (ref 98–111)
Creatinine, Ser: 1.03 mg/dL — ABNORMAL HIGH (ref 0.44–1.00)
Creatinine, Ser: 1.08 mg/dL — ABNORMAL HIGH (ref 0.44–1.00)
Creatinine, Ser: 0.99 mg/dL (ref 0.44–1.00)
Creatinine, Ser: 0.94 mg/dL (ref 0.44–1.00)
GFR calc Af Amer: >60 mL/min (ref >60)
GFR calc Af Amer: >60 mL/min (ref >60)
GFR calc Af Amer: >60 mL/min (ref >60)
GFR calc Af Amer: >60 mL/min (ref >60)
GFR calc non Af Amer: 59 mL/min — ABNORMAL LOW (ref >60)
GFR calc non Af Amer: 56 mL/min — ABNORMAL LOW (ref >60)
GFR calc non Af Amer: >60 mL/min (ref >60)
GFR calc non Af Amer: >60 mL/min (ref >60)
Glucose, Bld: 163 mg/dL — ABNORMAL HIGH (ref 70–99)
Glucose, Bld: 203 mg/dL — ABNORMAL HIGH (ref 70–99)
Glucose, Bld: 125 mg/dL — ABNORMAL HIGH (ref 70–99)
Glucose, Bld: 199 mg/dL — ABNORMAL HIGH (ref 70–99)
Potassium: 4.7 mmol/L (ref 3.5–5.1)
Potassium: 3.6 mmol/L (ref 3.5–5.1)
Potassium: 3.8 mmol/L (ref 3.5–5.1)
Potassium: 3.7 mmol/L (ref 3.5–5.1)
Sodium: 137 mmol/L (ref 135–145)
Sodium: 138 mmol/L (ref 135–145)
Sodium: 141 mmol/L (ref 135–145)
Sodium: 138 mmol/L (ref 135–145)

## 2019-09-13 LAB — GLUCOSE, CAPILLARY
Glucose-Capillary: 102 mg/dL — ABNORMAL HIGH (ref 70–99)
Glucose-Capillary: 111 mg/dL — ABNORMAL HIGH (ref 70–99)
Glucose-Capillary: 112 mg/dL — ABNORMAL HIGH (ref 70–99)
Glucose-Capillary: 114 mg/dL — ABNORMAL HIGH (ref 70–99)
Glucose-Capillary: 119 mg/dL — ABNORMAL HIGH (ref 70–99)
Glucose-Capillary: 121 mg/dL — ABNORMAL HIGH (ref 70–99)
Glucose-Capillary: 127 mg/dL — ABNORMAL HIGH (ref 70–99)
Glucose-Capillary: 129 mg/dL — ABNORMAL HIGH (ref 70–99)
Glucose-Capillary: 145 mg/dL — ABNORMAL HIGH (ref 70–99)
Glucose-Capillary: 152 mg/dL — ABNORMAL HIGH (ref 70–99)
Glucose-Capillary: 153 mg/dL — ABNORMAL HIGH (ref 70–99)
Glucose-Capillary: 158 mg/dL — ABNORMAL HIGH (ref 70–99)
Glucose-Capillary: 158 mg/dL — ABNORMAL HIGH (ref 70–99)
Glucose-Capillary: 160 mg/dL — ABNORMAL HIGH (ref 70–99)
Glucose-Capillary: 163 mg/dL — ABNORMAL HIGH (ref 70–99)
Glucose-Capillary: 171 mg/dL — ABNORMAL HIGH (ref 70–99)
Glucose-Capillary: 175 mg/dL — ABNORMAL HIGH (ref 70–99)
Glucose-Capillary: 180 mg/dL — ABNORMAL HIGH (ref 70–99)
Glucose-Capillary: 187 mg/dL — ABNORMAL HIGH (ref 70–99)
Glucose-Capillary: 189 mg/dL — ABNORMAL HIGH (ref 70–99)
Glucose-Capillary: 198 mg/dL — ABNORMAL HIGH (ref 70–99)
Glucose-Capillary: 201 mg/dL — ABNORMAL HIGH (ref 70–99)
Glucose-Capillary: 220 mg/dL — ABNORMAL HIGH (ref 70–99)

## 2019-09-13 LAB — CBC
HCT: 39.2 % (ref 36.0–46.0)
Hemoglobin: 12.8 g/dL (ref 12.0–15.0)
MCH: 31.5 pg (ref 26.0–34.0)
MCHC: 32.7 g/dL (ref 30.0–36.0)
MCV: 96.6 fL (ref 80.0–100.0)
Platelets: 237 10*3/uL (ref 150–400)
RBC: 4.06 MIL/uL (ref 3.87–5.11)
RDW: 14.6 % (ref 11.5–15.5)
WBC: 12.8 10*3/uL — ABNORMAL HIGH (ref 4.0–10.5)
nRBC: 0 % (ref 0.0–0.2)

## 2019-09-13 LAB — ECHOCARDIOGRAM COMPLETE
Height: 64 in
Weight: 2240.01 oz

## 2019-09-13 LAB — TROPONIN I (HIGH SENSITIVITY): Troponin I (High Sensitivity): 6259 ng/L (ref <18)

## 2019-09-13 LAB — HEPARIN LEVEL (UNFRACTIONATED)
Heparin Unfractionated: 0.46 IU/mL (ref 0.30–0.70)
Heparin Unfractionated: 0.31 IU/mL (ref 0.30–0.70)

## 2019-09-13 LAB — HIV ANTIBODY (ROUTINE TESTING W REFLEX): HIV Screen 4th Generation wRfx: NONREACTIVE

## 2019-09-13 MED ORDER — SODIUM CHLORIDE 0.9 % IV BOLUS
500.0000 mL | Freq: Once | INTRAVENOUS | Status: AC
  Administered 2019-09-13: 500 mL via INTRAVENOUS

## 2019-09-13 MED ORDER — METOPROLOL TARTRATE 25 MG PO TABS
25.0000 mg | ORAL_TABLET | Freq: Two times a day (BID) | ORAL | Status: DC
  Administered 2019-09-13 – 2019-09-15 (×4): 25 mg via ORAL
  Filled 2019-09-13 (×4): qty 1

## 2019-09-13 MED ORDER — POLYVINYL ALCOHOL 1.4 % OP SOLN: 1.0000 [drp] | OPHTHALMIC | Status: DC | PRN

## 2019-09-13 MED ORDER — PROMETHAZINE HCL 25 MG/ML IJ SOLN
12.5000 mg | Freq: Once | INTRAMUSCULAR | Status: DC
  Filled 2019-09-13 (×2): qty 1

## 2019-09-13 MED ORDER — SODIUM CHLORIDE 0.9 % IV BOLUS
1500.0000 mL | Freq: Once | INTRAVENOUS | Status: AC
  Administered 2019-09-13: 1500 mL via INTRAVENOUS

## 2019-09-13 NOTE — Progress Notes (Signed)
Spoke to Dr. Karleen Hampshire in regards to DM coordinator's recommendations for patient as well as latest CBG. Pt's last CBG 121, according to glucostabilizer it states to turn pump off. Pt had issues with pump being turned off too soon overnight with nausea and sugar spikes. DM coordinator recommended to increase D51/2 in order to keep CBG from dropping. Dr. Karleen Hampshire spoke with DM coordinator and the plan after speaking with Dr. Karleen Hampshire is for insulin drip to remain at 0.1 (do NOT turn off), put patient on carb mod diet, leave D51/2 rate at 178mL/hr.

## 2019-09-13 NOTE — ED Notes (Signed)
Paged admitting provider to verify orders.

## 2019-09-13 NOTE — Progress Notes (Addendum)
Inpatient Diabetes Program Recommendations  AACE/ADA: New Consensus Statement on Inpatient Glycemic Control (2015)  Target Ranges:  Prepandial:   less than 140 mg/dL      Peak postprandial:   less than 180 mg/dL (1-2 hours)      Critically ill patients:  140 - 180 mg/dL   Results for Wanda Hicks, Wanda Hicks (MRN [id number]) as of 09/13/2019 10:48  Ref. Range 09/12/2019 23:57 09/13/2019 00:56 09/13/2019 02:00 09/13/2019 03:00 09/13/2019 04:04 09/13/2019 05:19 09/13/2019 05:57 09/13/2019 06:55 09/13/2019 08:02 09/13/2019 08:56 09/13/2019 10:03  Glucose-Capillary Latest Ref Range: 70 - 99 mg/dL 158 (H) 127 (H) 112 (H) 114 (H) 158 (H) 201 (H) 220 (H) 163 (H) 158 (H) 152 (H) 145 (H)    Admit with: DKA/ CP/ ACS  History: Type 1 Diabetes  Home DM Meds: Insulin Pump       Jardiance 10 mg Daily  Current Orders: IV Insulin Drip     Patient wants Panniculectomy to remove excess skin.  Having questions about an auto-injector?  Went to PCP office yesterday morning and they gave her an injection of Humalog.  Went home and got worse.  EMS called and transported to ED.  PCP: Dr. Elisabeth Pigeon with [facility]  IV Insulin Drip started yesterday 09/28 at 5pm--CBGs were WNL so the GlucoStabilizer directed an IV Insulin drip rate of 0 units per hour.  Dextrose containing IVF started at 6pm last night and IV Insulin finally restarted at 9pm.     MD- Please note that patient was taking Jardiance (SGLT-2 inhibitor) along with her Insulin Pump prior to admission.  Per the ADA, it can takes much longer for the DKA to resolve when a patient is taking an SGLT-2 inhibitor medication prior to developing DKA (Euglycemic DKA).  Note that BMET from 6:44am today shows Anion Gap of 14 and CO2 still low at 13.    MD----It may help to resolve the DKA by increasing the rate of the D5 1/2 NS IVF.  Also, please place orders for more BMETs as DKA not yet resolved.    Do Not recommend transition off the IV  Insulin Drip until CO2 at least 20 and Anion gap down to 12 or less.  When BMET shows patient is ready to transition to SQ Insulin, patient would like to transition back to her Insulin pump.  Has supplies at bedside.  Best way to transition is have pt resume pump, continue IV Insulin drip for 1 hour after pump restarted, and then d/c IV Insulin Drip.     --Will follow patient during hospitalization--  Wyn Quaker RN, MSN, CDE Diabetes Coordinator Inpatient Glycemic Control Team Team Pager: [phone] (8a-5p)

## 2019-09-13 NOTE — Progress Notes (Signed)
[place] for Heparin Indication: chest pain/ACS  Allergies  Allergen Reactions  . No Known Allergies   . Latex Rash    Can tolerate some latex, but if on skin for a long period of time will cause a rash    Patient Measurements: Height: 5\' 4"  (162.6 cm) Weight: 140 lb (63.5 kg) IBW/kg (Calculated) : 54.7 Heparin Dosing Weight: 63.5 kg   Vital Signs: Temp: 99.6 F (37.6 C) (09/29 0059) Temp Source: Oral (09/29 0059) BP: 94/51 (09/29 0200) Pulse Rate: 100 (09/29 0200)  Labs: Recent Labs    09/12/19 1149 09/12/19 1215 09/12/19 1247 09/12/19 1520 09/12/19 1842 09/12/19 2219 09/13/19 0205  HGB 14.4  --  14.6  --   --   --   --   HCT 42.6  --  43.0  --   --   --   --   PLT 233  --   --   --   --   --   --   HEPARINUNFRC  --   --   --   --   --   --  0.46  CREATININE 1.06*  --   --   --  1.02* 1.13*  --   TROPONINIHS  --  6  --  209*  --   --   --     Estimated Creatinine Clearance: 45.7 mL/min (A) (by C-G formula based on SCr of 1.13 mg/dL (H)).  Assessment: 58 y.o. female with elevated cardiac markers for heparin  Goal of Therapy:  Heparin level 0.3-0.7 units/ml Monitor platelets by anticoagulation protocol: Yes   Plan:  Continue Heparin at current rate  Phillis Knack, PharmD, BCPS

## 2019-09-13 NOTE — Progress Notes (Addendum)
ANTICOAGULATION CONSULT NOTE  Pharmacy Consult for Heparin Indication: chest pain/ACS  Patient Measurements: Height: 5\' 4"  (162.6 cm) Weight: 140 lb (63.5 kg) IBW/kg (Calculated) : 54.7 Heparin Dosing Weight: 63.5 kg   Vital Signs: Temp: 98.7 F (37.1 C) (09/29 0855) Temp Source: Oral (09/29 0855) BP: 111/54 (09/29 0855) Pulse Rate: 94 (09/29 0855)  Labs: Recent Labs    09/12/19 1149 09/12/19 1215 09/12/19 1247 09/12/19 1520  09/12/19 2219 09/13/19 0205 09/13/19 0347 09/13/19 0644 09/13/19 0828  HGB 14.4  --  14.6  --   --   --   --   --  12.8  --   HCT 42.6  --  43.0  --   --   --   --   --  39.2  --   PLT 233  --   --   --   --   --   --   --  237  --   HEPARINUNFRC  --   --   --   --   --   --  0.46  --   --  0.31  CREATININE 1.06*  --   --   --    < > 1.13*  --  1.03* 1.08*  --   TROPONINIHS  --  6  --  209*  --   --   --   --   --   --    < > = values in this interval not displayed.    Estimated Creatinine Clearance: 47.8 mL/min (A) (by C-G formula based on SCr of 1.08 mg/dL (H)).  Assessment: 58 y.o. female presenting with hyperglycemia and emesis. PMH includes CHF, DM, GERD, heart murmur, HTN. No anticoagulation PTA. EKG with defuse ST depressions. Hgb 12.8, Plts 237, Scr 1.08. Pharmacy consulted for heparin dosing.   0800 HL 0.31 is therapeutic at low end of goal range. 2nd troponin level at 1215 was positive.  Goal of Therapy:  Heparin level 0.3-0.7 units/ml Monitor platelets by anticoagulation protocol: Yes   Plan:  Continue Heparin at current 800 units/hour Daily HL and CBC Monitor s/sx of bleeding   [place], Student-PharmD 09/13/2019 9:50 AM   I agree with the assessment and plan.  Harvel Quale 09/13/2019 10:38 AM

## 2019-09-13 NOTE — Progress Notes (Signed)
*   Echocardiogram 2D Echocardiogram has been performed.  Matilde Bash 09/13/2019, 10:15 AM

## 2019-09-13 NOTE — Progress Notes (Signed)
PROGRESS NOTE    HIEDI TOUCHTON  IPJ:[id number] DOB: June 22, 1961 DOA: 09/12/2019 PCP: Burnard Bunting, MD    Brief Narrative:  58 year old lady with prior history of type 1 diabetes on insulin pump for DKA and hypotension, she was found to have diffuse ST-T wave depression on EKG and cardiology consulted.   Assessment & Plan:   Principal Problem:   DKA (diabetic ketoacidoses) ([place]) Active Problems:   Abnormal ECG   HTN (hypertension)   Chronic diastolic heart failure (H[facility])   COPD (chronic obstructive pulmonary disease) ([place])   DKA Improving but her bicarb is still 18 anion gap is closed and CBGs have been well less than 200. Continue with the low-dose insulin gtt. and a repeat BMP in 6 hours and if bicarb is normalized then she can be transitioned to insulin pump. Restart diet and continue with IV fluids. Patient denies any nausea vomiting abdominal pain, chest pain or shortness of breath or dysuria at this time. Get hemoglobin A1c.   Hypertension Well-controlled blood pressure parameters Resume metoprolol 25 mg twice daily    Elevated troponins and abnormal EKG: Possible HOCM as per cardiology repeat EKG echocardiogram looking for evidence of dynamic left ventricular outflow tract obstruction and mitral insufficiency.  Patient was started on beta-blockers.  She was also started on IV heparin and plan for CT coronaries.   History of chronic diastolic heart failure She appears to be volume depleted due to DKA Continue with IV fluids.   Hypothyroidism Continue with Synthroid   COPD No wheezing heard on exam today.   Mild leukocytosis Unclear etiology continue to monitor. No signs of infection so far.  DVT prophylaxis: SCDs/IV heparin Code Status: Full code Family Communication: Family at bedside  disposition Plan: Pending clinical improvement and cardiology.   Consultants:   Cardiology  Procedures: Echocardiogram Antimicrobials:  None  Subjective: Patient denies any chest pain, shortness of breath, nausea, vomiting, abdominal pain  Objective: Vitals:   09/13/19 0256 09/13/19 0304 09/13/19 0406 09/13/19 0855  BP: (!) 99/51  (!) 94/44 (!) 111/54  Pulse: 99  (!) 106 94  Resp: (!) 23  (!) 26 (!) 21  Temp:  99 F (37.2 C)  98.7 F (37.1 C)  TempSrc:  Oral  Oral  SpO2: 96%  96% 99%  Weight:      Height:        Intake/Output Summary (Last 24 hours) at 09/13/2019 0942 Last data filed at 09/13/2019 0600 Gross per 24 hour  Intake 3452.17 ml  Output 350 ml  Net 3102.17 ml   Filed Weights   09/12/19 1311  Weight: 63.5 kg    Examination:  General exam: Appears calm and comfortable  Respiratory system: Clear to auscultation. Respiratory effort normal. Cardiovascular system: S1 & S2 heard, RRR. Marland Kitchen No pedal edema. Gastrointestinal system: Abdomen is nondistended, soft and nontender. No organomegaly or masses felt. Normal bowel sounds heard. Central nervous system: Alert and oriented. No focal neurological deficits. Extremities: Symmetric 5 x 5 power. Skin: No rashes, lesions or ulcers Psychiatry:  Mood & affect appropriate.     Data Reviewed: I have personally reviewed following labs and imaging studies  CBC: Recent Labs  Lab 09/12/19 1149 09/12/19 1247 09/13/19 0644  WBC 10.5  --  12.8*  HGB 14.4 14.6 12.8  HCT 42.6 43.0 39.2  MCV 95.3  --  96.6  PLT 233  --  734   Basic Metabolic Panel: Recent Labs  Lab 09/12/19 1149 09/12/19 1247 09/12/19 1842 09/12/19 2219  09/13/19 0347 09/13/19 0644  NA 138 136 139 140 137 138  K 4.2 4.1 4.1 4.7 4.7 3.6  CL 100  --  109 108 112* 111  CO2 18*  --  18* 15* 16* 13*  GLUCOSE 217*  --  133* 203* 163* 203*  BUN 21*  --  21* 23* 22* 23*  CREATININE 1.06*  --  1.02* 1.13* 1.03* 1.08*  CALCIUM 9.5  --  8.6* 8.8* 8.3* 8.1*  MG  --   --  1.9  --   --   --    GFR: Estimated Creatinine Clearance: 47.8 mL/min (A) (by C-G formula based on SCr of 1.08 mg/dL  (H)). Liver Function Tests: No results for input(s): AST, ALT, ALKPHOS, BILITOT, PROT, ALBUMIN in the last 168 hours. No results for input(s): LIPASE, AMYLASE in the last 168 hours. No results for input(s): AMMONIA in the last 168 hours. Coagulation Profile: No results for input(s): INR, PROTIME in the last 168 hours. Cardiac Enzymes: No results for input(s): CKTOTAL, CKMB, CKMBINDEX, TROPONINI in the last 168 hours. BNP (last 3 results) No results for input(s): PROBNP in the last 8760 hours. HbA1C: Recent Labs    09/12/19 1842  HGBA1C 6.0*   CBG: Recent Labs  Lab 09/13/19 0519 09/13/19 0557 09/13/19 0655 09/13/19 0802 09/13/19 0856  GLUCAP 201* 220* 163* 158* 152*   Lipid Profile: No results for input(s): CHOL, HDL, LDLCALC, TRIG, CHOLHDL, LDLDIRECT in the last 72 hours. Thyroid Function Tests: No results for input(s): TSH, T4TOTAL, FREET4, T3FREE, THYROIDAB in the last 72 hours. Anemia Panel: No results for input(s): VITAMINB12, FOLATE, FERRITIN, TIBC, IRON, RETICCTPCT in the last 72 hours. Sepsis Labs: Recent Labs  Lab 09/12/19 1630  LATICACIDVEN 1.5    Recent Results (from the past 240 hour(s))  SARS CORONAVIRUS 2 (TAT 6-24 HRS) Nasopharyngeal Nasopharyngeal Swab     Status: None   Collection Time: 09/12/19  4:39 PM   Specimen: Nasopharyngeal Swab  Result Value Ref Range Status   SARS Coronavirus 2 NEGATIVE NEGATIVE Final    Comment: (NOTE) SARS-CoV-2 target nucleic acids are NOT DETECTED. The SARS-CoV-2 RNA is generally detectable in upper and lower respiratory specimens during the acute phase of infection. Negative results do not preclude SARS-CoV-2 infection, do not rule out co-infections with other pathogens, and should not be used as the sole basis for treatment or other patient management decisions. Negative results must be combined with clinical observations, patient history, and epidemiological information. The expected result is Negative. Fact  Sheet for Patients: HairSlick.no Fact Sheet for Healthcare Providers: quierodirigir.com This test is not yet approved or cleared by the [place] FDA and  has been authorized for detection and/or diagnosis of SARS-CoV-2 by FDA under an Emergency Use Authorization (EUA). This EUA will remain  in effect (meaning this test can be used) for the duration of the COVID-19 declaration under Section 56 4(b)(1) of the Act, 21 U.S.C. section 360bbb-3(b)(1), unless the authorization is terminated or revoked sooner. Performed at [facility], [address], [place], [place] [address]          Radiology Studies: Dg Chest 2 View  Result Date: 09/12/2019 CLINICAL DATA:  Shortness of breath and chest pain EXAM: CHEST - 2 VIEW COMPARISON:  September 11, 2007 FINDINGS: Lungs are clear. Heart size and pulmonary vascularity are normal. No adenopathy. No pneumothorax. There is postoperative change in the lower cervical region. There are foci of degenerative change in the thoracic spine. IMPRESSION: No  edema or consolidation. Electronically Signed   By: Bretta Bang III M.D.   On: 09/12/2019 12:55   Ct Angio Chest/abd/pel For Dissection W And/or W/wo  Result Date: 09/12/2019 CLINICAL DATA:  Hypotension EXAM: CT ANGIOGRAPHY CHEST, ABDOMEN AND PELVIS TECHNIQUE: Multidetector CT imaging through the chest, abdomen and pelvis was performed using the standard protocol during bolus administration of intravenous contrast. Multiplanar reconstructed images and MIPs were obtained and reviewed to evaluate the vascular anatomy. CONTRAST:  OMNIPAQUE IOHEXOL 350 MG/ML SOLN COMPARISON:  None. FINDINGS: CTA CHEST FINDINGS Cardiovascular: Preferential opacification of the thoracic aorta. Minimal thoracic aortic atherosclerosis. Normal contour and caliber of the aorta without evidence of aneurysm, dissection, or other acute aortic pathology. 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 Normal heart size. Scattered three-vessel coronary artery calcifications, including calcification near the ostium of the left main coronary artery (series 7, image 63). No pericardial effusion. Mediastinum/Nodes: No enlarged mediastinal, hilar, or axillary lymph nodes. Thyroid gland, trachea, and esophagus demonstrate no significant findings. Lungs/Pleura: Diffuse bilateral interlobular septal thickening, and peribronchovascular thickening. Trace bilateral pleural effusions. Musculoskeletal: Anasarca. No acute or significant osseous findings. Review of the MIP images confirms the above findings. Review of the MIP images confirms the above findings. CTA ABDOMEN AND PELVIS FINDINGS VASCULAR Moderate mixed calcific atherosclerosis of the aorta. No evidence of aneurysm, dissection, or other acute aortic pathology. Incidental small accessory superior pole right renal artery otherwise standard branching pattern of the aorta. Review of the MIP images confirms the above findings. NON-VASCULAR Hepatobiliary: No solid liver abnormality is seen. No gallstones, gallbladder wall thickening, or biliary dilatation. Pancreas: Unremarkable. No pancreatic ductal dilatation or surrounding inflammatory changes. Spleen: Normal in size without significant abnormality. Adrenals/Urinary Tract: Adrenal glands are unremarkable. Kidneys are normal, without renal calculi, solid lesion, or hydronephrosis. Bladder is unremarkable. Stomach/Bowel: Stomach is within normal limits. Appendix appears normal. No evidence of bowel wall thickening, distention, or inflammatory changes. Lymphatic: No enlarged abdominal or pelvic lymph nodes. Reproductive: Fat containing bilateral ovarian dermoids, measuring 5.4 cm on the right and 3.9 cm on the left (series 7, image 349). Other: No abdominal wall hernia or abnormality. Anasarca. No abdominopelvic ascites. Musculoskeletal: No acute or significant  osseous findings. Review of the MIP images confirms the above findings. IMPRESSION: 1. Normal contour and caliber of the thoracic and abdominal aorta without evidence of dissection, aneurysm, or other acute aortic pathology. Aortic Atherosclerosis (ICD10-I70.0). 2.  Pulmonary edema. 3. Scattered three-vessel coronary artery calcifications, including calcification near the ostium of the left main coronary artery (series 7, image 63). 4.  Anasarca. 5. Fat containing bilateral ovarian dermoids, measuring 5.4 cm on the right and 3.9 cm on the left (series 7, image 349). Electronically Signed   By: Lauralyn Primes M.D.   On: 09/12/2019 15:47        Scheduled Meds:  acetaminophen  1,000 mg Oral BID   aspirin EC  81 mg Oral Daily   fluticasone  1 spray Each Nare Daily   levothyroxine  125 mcg Oral QAC breakfast   magnesium oxide  400 mg Oral BID   metoprolol tartrate  12.5 mg Oral BID   mometasone-formoterol  2 puff Inhalation BID   pantoprazole  40 mg Oral Daily   promethazine  12.5 mg Intravenous Once   Continuous Infusions:  sodium chloride 150 mL/hr at 09/12/19 1820   dextrose 5 % and 0.45% NaCl 150 mL/hr at 09/13/19 0333   heparin 800 Units/hr (09/13/19  0003)   insulin 2 Units/hr (09/13/19 0804)     LOS: 1 day        Kathlen ModyVijaya Abriel Hattery, MD [facility] Pager [phone]  If 7PM-7AM, please contact night-coverage [email or web address] Password [email or web address] 09/13/2019, 9:42 AM

## 2019-09-13 NOTE — Progress Notes (Signed)
MD- When BMET shows Anion Gap 12 or less and CO2 20 or higher, please begin transition to SQ Insulin process.  Patient would like to resume her insulin pump when ready.  Best way to do this is have pt resume her insulin pump and then Continue the IV Insulin Drip for 1 hour after the Insulin pump is restarted.  Once that 1 hour time period is completed can have the RN d/c the IV Insulin drip.  Please make sure to enter orders for the Insulin Pump     Called pt by phone (this DM Coordinator is working on the [place] with [facility] patients).  Pt has had Type 1 diabetes since the 1960s.  Lost a significant amount of weight and has been having issues with her insulin pump insertion sites due to excess loose skin.  Per the patient, her cannulas are bending when inserted without her knowing.  Per the patient, her basal rates on her insulin pump are so low (only gets about 21 units basal insulin per 24 hour period) that she doesn't get an "occlusion" alarm and then goes into early DKA.  Has tried smaller insertion sites (went from the 37mm to the 63mm) but still having problems.  Has been working with her PCP office (Dr. Reynaldo Minium with [place]) but has not had much success.  Desires to continue to use her insulin pump at home.  Has tried other sites in her body without much success.  Stated to me she has now tried her upper outside abdomen and that has been working.  Has been evaluated by the Plastic Surgeon team for a panniculectomy and is waiting for approval.    Was able to tell me she gets about 21 units basal insulin on her pump every 24 hours.  Was not able to verbalize her Carb Ratio or her Correction factor ratio but uses the Insulin pump bolus calculator to help with all boluses.  Has all new pump supplies at bedside and desires to restart her insulin pump when allowed and her DKA has resolved.  Has an appt with her PCP tomorrow (09/30) in the late afternoon.  Discussed with  pt that I am concerned about her restarting her Jardiance when she is discharged.  Pt stated she plans to hold off restarting it until she sees her PCP tomorrow.  Discussed with pt that Jardiance can place pts at higher risk for Euglycemia DKA and can take longer for DKA to resolve when Jardiance on board.  Pt thanked me for the info and in agreement that she will talk with PCP before restarting.    --Will follow patient during hospitalization--  Wyn Quaker RN, MSN, CDE Diabetes Coordinator Inpatient Glycemic Control Team Team Pager: [phone] (8a-5p)

## 2019-09-13 NOTE — Progress Notes (Signed)
Progress Note  Patient Name: Wanda Hicks Date of Encounter: 09/13/2019  Primary Cardiologist: Sanda Klein, MD   Subjective   Feeling much better.  Inpatient Medications    Scheduled Meds: . acetaminophen  1,000 mg Oral BID  . aspirin EC  81 mg Oral Daily  . fluticasone  1 spray Each Nare Daily  . levothyroxine  125 mcg Oral QAC breakfast  . magnesium oxide  400 mg Oral BID  . metoprolol tartrate  12.5 mg Oral BID  . mometasone-formoterol  2 puff Inhalation BID  . pantoprazole  40 mg Oral Daily  . promethazine  12.5 mg Intravenous Once   Continuous Infusions: . sodium chloride 150 mL/hr at 09/12/19 1820  . dextrose 5 % and 0.45% NaCl 150 mL/hr at 09/13/19 0333  . heparin 800 Units/hr (09/13/19 0003)  . insulin 2 Units/hr (09/13/19 0804)   PRN Meds: ipratropium-albuterol, polyvinyl alcohol   Vital Signs    Vitals:   09/13/19 0256 09/13/19 0304 09/13/19 0406 09/13/19 0855  BP: (!) 99/51  (!) 94/44 (!) 111/54  Pulse: 99  (!) 106 94  Resp: (!) 23  (!) 26 (!) 21  Temp:  99 F (37.2 C)  98.7 F (37.1 C)  TempSrc:  Oral  Oral  SpO2: 96%  96% 99%  Weight:      Height:        Intake/Output Summary (Last 24 hours) at 09/13/2019 0928 Last data filed at 09/13/2019 0600 Gross per 24 hour  Intake 3452.17 ml  Output 350 ml  Net 3102.17 ml   Last 3 Weights 09/12/2019 09/09/2019 08/02/2018  Weight (lbs) 140 lb 140 lb 212 lb 6.4 oz  Weight (kg) 63.504 kg 63.504 kg 96.344 kg      Telemetry    NSR - Personally Reviewed  ECG    No new tracing- Personally Reviewed  Physical Exam  Appears comfortable lying fully supine GEN: No acute distress.   Neck: No JVD Cardiac: RRR, both her systolic murmurs are much less intense (grade 1/6 early to mid peaking aortic ejection murmur, grade 2/6 rather harsh apical holosystolic murmur radiating into the axilla, no rubs or gallops.  Respiratory: Clear to auscultation bilaterally. GI: Soft, nontender, non-distended  MS: No  edema; No deformity. Neuro:  Nonfocal  Psych: Normal affect   Labs    High Sensitivity Troponin:   Recent Labs  Lab 09/12/19 1215 09/12/19 1520  TROPONINIHS 6 209*      Chemistry Recent Labs  Lab 09/12/19 2219 09/13/19 0347 09/13/19 0644  NA 140 137 138  K 4.7 4.7 3.6  CL 108 112* 111  CO2 15* 16* 13*  GLUCOSE 203* 163* 203*  BUN 23* 22* 23*  CREATININE 1.13* 1.03* 1.08*  CALCIUM 8.8* 8.3* 8.1*  GFRNONAA 53* 59* 56*  GFRAA >60 >60 >60  ANIONGAP 17* 9 14     Hematology Recent Labs  Lab 09/12/19 1149 09/12/19 1247 09/13/19 0644  WBC 10.5  --  12.8*  RBC 4.47  --  4.06  HGB 14.4 14.6 12.8  HCT 42.6 43.0 39.2  MCV 95.3  --  96.6  MCH 32.2  --  31.5  MCHC 33.8  --  32.7  RDW 14.0  --  14.6  PLT 233  --  237    BNPNo results for input(s): BNP, PROBNP in the last 168 hours.   DDimer No results for input(s): DDIMER in the last 168 hours.   Radiology    Dg Chest 2  View  Result Date: 09/12/2019 CLINICAL DATA:  Shortness of breath and chest pain EXAM: CHEST - 2 VIEW COMPARISON:  September 11, 2007 FINDINGS: Lungs are clear. Heart size and pulmonary vascularity are normal. No adenopathy. No pneumothorax. There is postoperative change in the lower cervical region. There are foci of degenerative change in the thoracic spine. IMPRESSION: No edema or consolidation. Electronically Signed   By: Bretta Bang III M.D.   On: 09/12/2019 12:55   Ct Angio Chest/abd/pel For Dissection W And/or W/wo  Result Date: 09/12/2019 CLINICAL DATA:  Hypotension EXAM: CT ANGIOGRAPHY CHEST, ABDOMEN AND PELVIS TECHNIQUE: Multidetector CT imaging through the chest, abdomen and pelvis was performed using the standard protocol during bolus administration of intravenous contrast. Multiplanar reconstructed images and MIPs were obtained and reviewed to evaluate the vascular anatomy. CONTRAST:  OMNIPAQUE IOHEXOL 350 MG/ML SOLN COMPARISON:  None. FINDINGS: CTA CHEST FINDINGS Cardiovascular:  Preferential opacification of the thoracic aorta. Minimal thoracic aortic atherosclerosis. Normal contour and caliber of the aorta without evidence of aneurysm, dissection, or other acute aortic pathology. 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 Normal heart size. Scattered three-vessel coronary artery calcifications, including calcification near the ostium of the left main coronary artery (series 7, image 63). No pericardial effusion. Mediastinum/Nodes: No enlarged mediastinal, hilar, or axillary lymph nodes. Thyroid gland, trachea, and esophagus demonstrate no significant findings. Lungs/Pleura: Diffuse bilateral interlobular septal thickening, and peribronchovascular thickening. Trace bilateral pleural effusions. Musculoskeletal: Anasarca. No acute or significant osseous findings. Review of the MIP images confirms the above findings. Review of the MIP images confirms the above findings. CTA ABDOMEN AND PELVIS FINDINGS VASCULAR Moderate mixed calcific atherosclerosis of the aorta. No evidence of aneurysm, dissection, or other acute aortic pathology. Incidental small accessory superior pole right renal artery otherwise standard branching pattern of the aorta. Review of the MIP images confirms the above findings. NON-VASCULAR Hepatobiliary: No solid liver abnormality is seen. No gallstones, gallbladder wall thickening, or biliary dilatation. Pancreas: Unremarkable. No pancreatic ductal dilatation or surrounding inflammatory changes. Spleen: Normal in size without significant abnormality. Adrenals/Urinary Tract: Adrenal glands are unremarkable. Kidneys are normal, without renal calculi, solid lesion, or hydronephrosis. Bladder is unremarkable. Stomach/Bowel: Stomach is within normal limits. Appendix appears normal. No evidence of bowel wall thickening, distention, or inflammatory changes. Lymphatic: No enlarged abdominal or pelvic lymph nodes. Reproductive: Fat containing bilateral  ovarian dermoids, measuring 5.4 cm on the right and 3.9 cm on the left (series 7, image 349). Other: No abdominal wall hernia or abnormality. Anasarca. No abdominopelvic ascites. Musculoskeletal: No acute or significant osseous findings. Review of the MIP images confirms the above findings. IMPRESSION: 1. Normal contour and caliber of the thoracic and abdominal aorta without evidence of dissection, aneurysm, or other acute aortic pathology. Aortic Atherosclerosis (ICD10-I70.0). 2.  Pulmonary edema. 3. Scattered three-vessel coronary artery calcifications, including calcification near the ostium of the left main coronary artery (series 7, image 63). 4.  Anasarca. 5. Fat containing bilateral ovarian dermoids, measuring 5.4 cm on the right and 3.9 cm on the left (series 7, image 349). Electronically Signed   By: Lauralyn Primes M.D.   On: 09/12/2019 15:47    Cardiac Studies   03/01/2018 echocardiogram - Left ventricle: The cavity size was normal. Wall thickness was increased in a pattern of mild LVH. Systolic function was vigorous. The estimated ejection fraction was in the range of 75% to 80%. There was dynamic obstruction during Valsalvaat an indeterminate location, with mid-cavity obliteration, a  peak velocity of 397 cm/sec, and a peak gradient of 63 mm Hg. Wall motion was normal; there were no regional wall motion abnormalities. Doppler parameters are consistent with abnormal left ventricular relaxation (grade 1 diastolic dysfunction). - Mitral valve: Mildly calcified annulus. Mean gradient (D): 4 mm Hg. Peak gradient (D): 9 mm Hg. Valve area by pressure half-time: 2.12 cm^2.  Patient Profile     58 y.o. female with a hx of chronic diastolic heart failure, and, hyperdynamic left ventricular systolic function with murmur suggestive of outflow tract obstruction, history of asymptomatic Hollenhorst plaques, type 1 diabetes mellitus since age 89 on insulin pump with increasingly  frequent episodes of DKA, who is being seen today for the evaluation of abnormal ECG at the request of Dr. Particia NearingHaviland.  Assessment & Plan    1. 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  Not sure whether this is a true structural abnormality or (more likely) just similar pathophysiology in a patient with left ventricular hypertrophy and a very small left ventricular cavity due to hypovolemia.  Her blood pressure is better and she is receiving intravenous fluids.  We will administer a very low-dose of beta-blocker and plan to titrate the dose up.  Recheck her echocardiogram specifically looking for evidence of dynamic LV outflow tract obstruction and mitral insufficiency.  Avoid potent vasodilators.  Gradually increase beta-blockers. 2. CAD/Abnormal troponin:  She has some coronary atherosclerosis with an exceptionally worrisome plaque at the ostium of the left main coronary artery.  No angina.  Will look for evidence of wall motion abnormalities on echocardiogram this morning.  Repeat ECG.  Plan coronary CT angiogram when we can get her heart rate down to the low 60s.  Repeat troponin, if it is heading down I think we could not attributed to the hemodynamic abnormalities of DKA and hypotension, rather than a true acute coronary event. On intravenous heparin while we wait for test results. 3. Chronic diastolic heart failure: Clearly she is not currently hypervolemic, quite to the contrary she is volume depleted.  Chronic therapy with SGLT2 inhibitors may increase the likelihood that she develops hypovolemia which can worsen the hemodynamic consequences of her cardiac illness.  May need to reconsider the use of these medications, especially in a patient that is so prone to DKA.     For questions or updates, please contact [facility] Please consult [email or web address] for contact info under         Signed, Thurmon FairMihai Jolyn Deshmukh, MD  09/13/2019, 9:28 AM

## 2019-09-14 ENCOUNTER — Inpatient Hospital Stay (HOSPITAL_COMMUNITY): Payer: BC Managed Care – PPO

## 2019-09-14 DIAGNOSIS — R778 Other specified abnormalities of plasma proteins: Secondary | ICD-10-CM

## 2019-09-14 DIAGNOSIS — R7989 Other specified abnormal findings of blood chemistry: Secondary | ICD-10-CM

## 2019-09-14 DIAGNOSIS — I421 Obstructive hypertrophic cardiomyopathy: Secondary | ICD-10-CM | POA: Diagnosis present

## 2019-09-14 DIAGNOSIS — I214 Non-ST elevation (NSTEMI) myocardial infarction: Secondary | ICD-10-CM

## 2019-09-14 LAB — CBC
HCT: 36.6 % (ref 36.0–46.0)
Hemoglobin: 12.1 g/dL (ref 12.0–15.0)
MCH: 31.8 pg (ref 26.0–34.0)
MCHC: 33.1 g/dL (ref 30.0–36.0)
MCV: 96.1 fL (ref 80.0–100.0)
Platelets: 193 10*3/uL (ref 150–400)
RBC: 3.81 MIL/uL — ABNORMAL LOW (ref 3.87–5.11)
RDW: 14.7 % (ref 11.5–15.5)
WBC: 7.7 10*3/uL (ref 4.0–10.5)
nRBC: 0 % (ref 0.0–0.2)

## 2019-09-14 LAB — BASIC METABOLIC PANEL
Anion gap: 6 (ref 5–15)
Anion gap: 9 (ref 5–15)
Anion gap: 7 (ref 5–15)
BUN: 12 mg/dL (ref 6–20)
BUN: 12 mg/dL (ref 6–20)
BUN: 13 mg/dL (ref 6–20)
CO2: 18 mmol/L — ABNORMAL LOW (ref 22–32)
CO2: 17 mmol/L — ABNORMAL LOW (ref 22–32)
CO2: 20 mmol/L — ABNORMAL LOW (ref 22–32)
Calcium: 8.2 mg/dL — ABNORMAL LOW (ref 8.9–10.3)
Calcium: 8.7 mg/dL — ABNORMAL LOW (ref 8.9–10.3)
Calcium: 8.7 mg/dL — ABNORMAL LOW (ref 8.9–10.3)
Chloride: 115 mmol/L — ABNORMAL HIGH (ref 98–111)
Chloride: 117 mmol/L — ABNORMAL HIGH (ref 98–111)
Chloride: 114 mmol/L — ABNORMAL HIGH (ref 98–111)
Creatinine, Ser: 0.8 mg/dL (ref 0.44–1.00)
Creatinine, Ser: 0.76 mg/dL (ref 0.44–1.00)
Creatinine, Ser: 0.8 mg/dL (ref 0.44–1.00)
GFR calc Af Amer: >60 mL/min (ref >60)
GFR calc Af Amer: >60 mL/min (ref >60)
GFR calc Af Amer: >60 mL/min (ref >60)
GFR calc non Af Amer: >60 mL/min (ref >60)
GFR calc non Af Amer: >60 mL/min (ref >60)
GFR calc non Af Amer: >60 mL/min (ref >60)
Glucose, Bld: 171 mg/dL — ABNORMAL HIGH (ref 70–99)
Glucose, Bld: 179 mg/dL — ABNORMAL HIGH (ref 70–99)
Glucose, Bld: 135 mg/dL — ABNORMAL HIGH (ref 70–99)
Potassium: 3.6 mmol/L (ref 3.5–5.1)
Potassium: 4.1 mmol/L (ref 3.5–5.1)
Potassium: 4.4 mmol/L (ref 3.5–5.1)
Sodium: 139 mmol/L (ref 135–145)
Sodium: 143 mmol/L (ref 135–145)
Sodium: 141 mmol/L (ref 135–145)

## 2019-09-14 LAB — URINALYSIS, ROUTINE W REFLEX MICROSCOPIC
Bacteria, UA: NONE SEEN
Bilirubin Urine: NEGATIVE
Glucose, UA: >=500 mg/dL — AB
Hgb urine dipstick: NEGATIVE
Ketones, ur: NEGATIVE mg/dL
Leukocytes,Ua: NEGATIVE
Nitrite: NEGATIVE
Protein, ur: NEGATIVE mg/dL
Specific Gravity, Urine: 1.021 (ref 1.005–1.030)
pH: 5 (ref 5.0–8.0)

## 2019-09-14 LAB — GLUCOSE, CAPILLARY
Glucose-Capillary: 109 mg/dL — ABNORMAL HIGH (ref 70–99)
Glucose-Capillary: 110 mg/dL — ABNORMAL HIGH (ref 70–99)
Glucose-Capillary: 111 mg/dL — ABNORMAL HIGH (ref 70–99)
Glucose-Capillary: 114 mg/dL — ABNORMAL HIGH (ref 70–99)
Glucose-Capillary: 114 mg/dL — ABNORMAL HIGH (ref 70–99)
Glucose-Capillary: 122 mg/dL — ABNORMAL HIGH (ref 70–99)
Glucose-Capillary: 125 mg/dL — ABNORMAL HIGH (ref 70–99)
Glucose-Capillary: 126 mg/dL — ABNORMAL HIGH (ref 70–99)
Glucose-Capillary: 128 mg/dL — ABNORMAL HIGH (ref 70–99)
Glucose-Capillary: 137 mg/dL — ABNORMAL HIGH (ref 70–99)
Glucose-Capillary: 138 mg/dL — ABNORMAL HIGH (ref 70–99)
Glucose-Capillary: 138 mg/dL — ABNORMAL HIGH (ref 70–99)
Glucose-Capillary: 147 mg/dL — ABNORMAL HIGH (ref 70–99)
Glucose-Capillary: 148 mg/dL — ABNORMAL HIGH (ref 70–99)
Glucose-Capillary: 161 mg/dL — ABNORMAL HIGH (ref 70–99)
Glucose-Capillary: 163 mg/dL — ABNORMAL HIGH (ref 70–99)
Glucose-Capillary: 165 mg/dL — ABNORMAL HIGH (ref 70–99)
Glucose-Capillary: 167 mg/dL — ABNORMAL HIGH (ref 70–99)
Glucose-Capillary: 169 mg/dL — ABNORMAL HIGH (ref 70–99)
Glucose-Capillary: 180 mg/dL — ABNORMAL HIGH (ref 70–99)
Glucose-Capillary: 181 mg/dL — ABNORMAL HIGH (ref 70–99)
Glucose-Capillary: 183 mg/dL — ABNORMAL HIGH (ref 70–99)

## 2019-09-14 LAB — HEPARIN LEVEL (UNFRACTIONATED): Heparin Unfractionated: 0.43 IU/mL (ref 0.30–0.70)

## 2019-09-14 LAB — MRSA PCR SCREENING: MRSA by PCR: NEGATIVE

## 2019-09-14 LAB — MAGNESIUM: Magnesium: 2.1 mg/dL (ref 1.7–2.4)

## 2019-09-14 MED ORDER — IOHEXOL 350 MG/ML SOLN
80.0000 mL | Freq: Once | INTRAVENOUS | Status: AC | PRN
  Administered 2019-09-14: 80 mL via INTRAVENOUS

## 2019-09-14 MED ORDER — ROSUVASTATIN CALCIUM 20 MG PO TABS
20.0000 mg | ORAL_TABLET | Freq: Every day | ORAL | Status: DC
  Administered 2019-09-14: 20 mg via ORAL
  Filled 2019-09-14: qty 1

## 2019-09-14 MED ORDER — NITROGLYCERIN 0.4 MG SL SUBL
SUBLINGUAL_TABLET | SUBLINGUAL | Status: AC
  Administered 2019-09-14: 0.8 mg
  Filled 2019-09-14: qty 2

## 2019-09-14 MED ORDER — METOPROLOL TARTRATE 5 MG/5ML IV SOLN
INTRAVENOUS | Status: AC
  Administered 2019-09-14: 5 mg
  Filled 2019-09-14: qty 5

## 2019-09-14 MED ORDER — HEPARIN SODIUM (PORCINE) 5000 UNIT/ML IJ SOLN
5000.0000 [IU] | Freq: Three times a day (TID) | INTRAMUSCULAR | Status: DC
  Administered 2019-09-14 – 2019-09-15 (×2): 5000 [IU] via SUBCUTANEOUS
  Filled 2019-09-14 (×2): qty 1

## 2019-09-14 MED ORDER — INSULIN PUMP
Freq: Three times a day (TID) | SUBCUTANEOUS | Status: DC
  Administered 2019-09-14: 10:00:00 via SUBCUTANEOUS
  Filled 2019-09-14: qty 1

## 2019-09-14 MED ORDER — SODIUM BICARBONATE 650 MG PO TABS
650.0000 mg | ORAL_TABLET | Freq: Two times a day (BID) | ORAL | Status: DC
  Administered 2019-09-14 – 2019-09-15 (×3): 650 mg via ORAL
  Filled 2019-09-14 (×4): qty 1

## 2019-09-14 MED ORDER — POTASSIUM CHLORIDE CRYS ER 20 MEQ PO TBCR
40.0000 meq | EXTENDED_RELEASE_TABLET | Freq: Once | ORAL | Status: AC
  Administered 2019-09-14: 40 meq via ORAL
  Filled 2019-09-14: qty 2

## 2019-09-14 MED ORDER — INSULIN PUMP
Freq: Three times a day (TID) | SUBCUTANEOUS | Status: DC
  Administered 2019-09-14: 22:00:00 0.875 via SUBCUTANEOUS
  Administered 2019-09-14: 19:00:00 via SUBCUTANEOUS
  Administered 2019-09-14: 21:00:00 0.875 via SUBCUTANEOUS
  Administered 2019-09-15: 02:00:00 0.3 via SUBCUTANEOUS
  Administered 2019-09-15: 09:00:00 via SUBCUTANEOUS
  Administered 2019-09-15: 0.9 via SUBCUTANEOUS
  Filled 2019-09-14: qty 1

## 2019-09-14 MED ORDER — INSULIN PUMP: Freq: Three times a day (TID) | SUBCUTANEOUS | Status: DC

## 2019-09-14 NOTE — Progress Notes (Signed)
PROGRESS NOTE    Wanda RunningSuzanne M Hicks  ZOX:[id number] DOB: [birth year] DOA: 09/12/2019 PCP: Geoffry ParadiseAronson, Richard, MD   Brief Narrative:  58 year old lady with prior history of type 1 diabetes on insulin pump for DKA and hypotension, she was found to have diffuse ST-T wave depression on EKG and cardiology consulted.    Assessment & Plan:   Principal Problem:   DKA (diabetic ketoacidoses) (HCC) Active Problems:   Abnormal ECG   HTN (hypertension)   Chronic diastolic heart failure (HCC)   COPD (chronic obstructive pulmonary disease) (HCC)  #1 DKA type I diabetic hemoglobin A1c 6.0 Questionable etiology.  Concern for cardiac etiology.  No signs or symptoms of infectious etiology.  Patient admitted with DKA currently on glucose stabilizer.  CBGs improved.  Patient still acidotic with bicarb of 17 this morning.  Anion gap currently closed.  We will continue glucose stabilizer until bicarb equal to or greater than 20 and then subsequently transition back to patient's home insulin pump.  Decrease IV fluid rate to 75 cc/h.  Place on bicarb tablets twice daily x48 hours.  Continue current diet.  Follow.  2.  HOCM Noted on 2D echo.  Patient for cardiac CT today for further evaluation.  Continue current dose Lopressor.  Decrease IV fluids to 75 cc/h and once patient off insulin glucose stabilizer will discontinue IV fluids.  Cardiology following and recommending avoidance of diuretics and potent vasodilators as well as Jardiance.  Per cardiology.  3.  Elevated troponin/NSTEMI/CAD Patient denies any chest pain however he presented in DKA which may have been anginal equivalent.  Patient noted to have elevated troponin which went from 6 >>209>>>6259.  2D echo done with a EF of 65 to 70%, left ventricular diastolic Doppler parameters consistent with impaired relaxation pattern, moderate chordal systolic anterior motion of mitral valve, findings consistent with HOCM.  Continue current dose of beta-blocker.  Will  decrease IV fluids and once patient is off glucose stabilizer will discontinue IV fluids.  Continue IV heparin, statin, aspirin.  Cardiology following and appreciate input and recommendations.  Per cardiology.  4.  Hypothyroidism Continue home dose Synthroid.  Will need repeat thyroid function studies done in 4 to 6 weeks.  5.  Hypertension Continue home regimen metoprolol.  Follow.  6.  History of chronic diastolic heart failure Patient noted to be volume depleted on presentation with DKA.  Patient aggressively hydrated with IV fluids.  Patient with some complaints of starting to feel bloated.  Decrease IV fluids to 75 cc/h and once glucose stabilizer has been discontinued will DC IV fluids.  Monitor volume status closely as patient with HOCM and will need to avoid significant diuresis.  Continue beta-blocker, aspirin, statin.  Cardiology following and appreciate input and recommendations.  7.  COPD Stable.  DVT prophylaxis: Heparin Code Status: Full Family Communication: Updated patient.  No family at bedside. Disposition Plan: Likely home once DKA is resolved, patient cleared by cardiology.   Consultants:   Cardiology: Dr.Croitoru 09/12/2019  Procedures:   CT coronary chest pending 09/14/2019  CT angiogram chest abdomen and pelvis 09/12/2019  2D echo 09/13/2019  Chest x-ray 09/04/2019  Antimicrobials:   None   Subjective: Patient sitting up at bedside on the telephone.  Patient denies any chest pain.  Patient states some shortness of breath.  Patient complaining of feeling bloated and started to retain fluid.  Tolerating oral intake.  Denies any nausea or vomiting.  Objective: Vitals:   09/13/19 2321 09/14/19 0400 09/14/19 0849 09/14/19 1126  BP: (!) 107/54 (!) 104/53 135/60   Pulse: 64 (!) 58 73 67  Resp:   (!) 24 20  Temp: 97.8 F (36.6 C) 98.2 F (36.8 C) 98.5 F (36.9 C) 98.4 F (36.9 C)  TempSrc: Oral Oral Oral Oral  SpO2: 97% 97% 98% 98%  Weight:        Height:        Intake/Output Summary (Last 24 hours) at 09/14/2019 1241 Last data filed at 09/14/2019 0642 Gross per 24 hour  Intake 1603.27 ml  Output --  Net 1603.27 ml   Filed Weights   09/12/19 1311  Weight: 63.5 kg    Examination:  General exam: Appears calm and comfortable  Respiratory system: Clear to auscultation. Respiratory effort normal. Cardiovascular system: S1 & S2 heard, RRR. No JVD, murmurs, rubs, gallops or clicks.  Trace to 1+ bilateral lower extremity edema. Gastrointestinal system: Abdomen is nondistended, soft and nontender. No organomegaly or masses felt. Normal bowel sounds heard. Central nervous system: Alert and oriented. No focal neurological deficits. Extremities: Trace to 1+ bilateral lower extremity edema. Skin: No rashes, lesions or ulcers Psychiatry: Judgement and insight appear normal. Mood & affect appropriate.     Data Reviewed: I have personally reviewed following labs and imaging studies  CBC: Recent Labs  Lab 09/12/19 1149 09/12/19 1247 09/13/19 0644 09/14/19 0611  WBC 10.5  --  12.8* 7.7  HGB 14.4 14.6 12.8 12.1  HCT 42.6 43.0 39.2 36.6  MCV 95.3  --  96.6 96.1  PLT 233  --  237 193   Basic Metabolic Panel: Recent Labs  Lab 09/12/19 1842  09/13/19 0644 09/13/19 1245 09/13/19 1953 09/14/19 0611 09/14/19 0936 09/14/19 1116  NA 139   < > 138 141 138 139  --  143  K 4.1   < > 3.6 3.8 3.7 3.6  --  4.1  CL 109   < > 111 114* 115* 115*  --  117*  CO2 18*   < > 13* 18* 16* 18*  --  17*  GLUCOSE 133*   < > 203* 125* 199* 171*  --  179*  BUN 21*   < > 23* 18 17 12   --  12  CREATININE 1.02*   < > 1.08* 0.99 0.94 0.80  --  0.76  CALCIUM 8.6*   < > 8.1* 8.6* 8.5* 8.2*  --  8.7*  MG 1.9  --   --   --   --   --  2.1  --    < > = values in this interval not displayed.   GFR: Estimated Creatinine Clearance: 64.6 mL/min (by C-G formula based on SCr of 0.76 mg/dL). Liver Function Tests: No results for input(s): AST, ALT, ALKPHOS,  BILITOT, PROT, ALBUMIN in the last 168 hours. No results for input(s): LIPASE, AMYLASE in the last 168 hours. No results for input(s): AMMONIA in the last 168 hours. Coagulation Profile: No results for input(s): INR, PROTIME in the last 168 hours. Cardiac Enzymes: No results for input(s): CKTOTAL, CKMB, CKMBINDEX, TROPONINI in the last 168 hours. BNP (last 3 results) No results for input(s): PROBNP in the last 8760 hours. HbA1C: Recent Labs    09/12/19 1842  HGBA1C 6.0*   CBG: Recent Labs  Lab 09/14/19 0821 09/14/19 0925 09/14/19 1039 09/14/19 1124 09/14/19 1146  GLUCAP 147* 161* 165* 180* 167*   Lipid Profile: No results for input(s): CHOL, HDL, LDLCALC, TRIG, CHOLHDL, LDLDIRECT in the last 72 hours. Thyroid Function Tests:  No results for input(s): TSH, T4TOTAL, FREET4, T3FREE, THYROIDAB in the last 72 hours. Anemia Panel: No results for input(s): VITAMINB12, FOLATE, FERRITIN, TIBC, IRON, RETICCTPCT in the last 72 hours. Sepsis Labs: Recent Labs  Lab 09/12/19 1630  LATICACIDVEN 1.5    Recent Results (from the past 240 hour(s))  SARS CORONAVIRUS 2 (TAT 6-24 HRS) Nasopharyngeal Nasopharyngeal Swab     Status: None   Collection Time: 09/12/19  4:39 PM   Specimen: Nasopharyngeal Swab  Result Value Ref Range Status   SARS Coronavirus 2 NEGATIVE NEGATIVE Final    Comment: (NOTE) SARS-CoV-2 target nucleic acids are NOT DETECTED. The SARS-CoV-2 RNA is generally detectable in upper and lower respiratory specimens during the acute phase of infection. Negative results do not preclude SARS-CoV-2 infection, do not rule out co-infections with other pathogens, and should not be used as the sole basis for treatment or other patient management decisions. Negative results must be combined with clinical observations, patient history, and epidemiological information. The expected result is Negative. Fact Sheet for Patients: HairSlick.no Fact Sheet for  Healthcare Providers: quierodirigir.com This test is not yet approved or cleared by the [place] FDA and  has been authorized for detection and/or diagnosis of SARS-CoV-2 by FDA under an Emergency Use Authorization (EUA). This EUA will remain  in effect (meaning this test can be used) for the duration of the COVID-19 declaration under Section 56 4(b)(1) of the Act, 21 U.S.C. section 360bbb-3(b)(1), unless the authorization is terminated or revoked sooner. Performed at [facility], [address], [place], [place] [address]          Radiology Studies: Dg Chest 2 View  Result Date: 09/12/2019 CLINICAL DATA:  Shortness of breath and chest pain EXAM: CHEST - 2 VIEW COMPARISON:  September 11, 2007 FINDINGS: Lungs are clear. Heart size and pulmonary vascularity are normal. No adenopathy. No pneumothorax. There is postoperative change in the lower cervical region. There are foci of degenerative change in the thoracic spine. IMPRESSION: No edema or consolidation. Electronically Signed   By: Bretta Bang III M.D.   On: 09/12/2019 12:55   Ct Angio Chest/abd/pel For Dissection W And/or W/wo  Result Date: 09/12/2019 CLINICAL DATA:  Hypotension EXAM: CT ANGIOGRAPHY CHEST, ABDOMEN AND PELVIS TECHNIQUE: Multidetector CT imaging through the chest, abdomen and pelvis was performed using the standard protocol during bolus administration of intravenous contrast. Multiplanar reconstructed images and MIPs were obtained and reviewed to evaluate the vascular anatomy. CONTRAST:  OMNIPAQUE IOHEXOL 350 MG/ML SOLN COMPARISON:  None. FINDINGS: CTA CHEST FINDINGS Cardiovascular: Preferential opacification of the thoracic aorta. Minimal thoracic aortic atherosclerosis. Normal contour and caliber of the aorta without evidence of aneurysm, dissection, or other acute aortic pathology. 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 Normal heart size.  Scattered three-vessel coronary artery calcifications, including calcification near the ostium of the left main coronary artery (series 7, image 63). No pericardial effusion. Mediastinum/Nodes: No enlarged mediastinal, hilar, or axillary lymph nodes. Thyroid gland, trachea, and esophagus demonstrate no significant findings. Lungs/Pleura: Diffuse bilateral interlobular septal thickening, and peribronchovascular thickening. Trace bilateral pleural effusions. Musculoskeletal: Anasarca. No acute or significant osseous findings. Review of the MIP images confirms the above findings. Review of the MIP images confirms the above findings. CTA ABDOMEN AND PELVIS FINDINGS VASCULAR Moderate mixed calcific atherosclerosis of the aorta. No evidence of aneurysm, dissection, or other acute aortic pathology. Incidental small accessory superior pole right renal artery otherwise standard branching pattern of the  aorta. Review of the MIP images confirms the above findings. NON-VASCULAR Hepatobiliary: No solid liver abnormality is seen. No gallstones, gallbladder wall thickening, or biliary dilatation. Pancreas: Unremarkable. No pancreatic ductal dilatation or surrounding inflammatory changes. Spleen: Normal in size without significant abnormality. Adrenals/Urinary Tract: Adrenal glands are unremarkable. Kidneys are normal, without renal calculi, solid lesion, or hydronephrosis. Bladder is unremarkable. Stomach/Bowel: Stomach is within normal limits. Appendix appears normal. No evidence of bowel wall thickening, distention, or inflammatory changes. Lymphatic: No enlarged abdominal or pelvic lymph nodes. Reproductive: Fat containing bilateral ovarian dermoids, measuring 5.4 cm on the right and 3.9 cm on the left (series 7, image 349). Other: No abdominal wall hernia or abnormality. Anasarca. No abdominopelvic ascites. Musculoskeletal: No acute or significant osseous findings. Review of the MIP images confirms the above findings.  IMPRESSION: 1. Normal contour and caliber of the thoracic and abdominal aorta without evidence of dissection, aneurysm, or other acute aortic pathology. Aortic Atherosclerosis (ICD10-I70.0). 2.  Pulmonary edema. 3. Scattered three-vessel coronary artery calcifications, including calcification near the ostium of the left main coronary artery (series 7, image 63). 4.  Anasarca. 5. Fat containing bilateral ovarian dermoids, measuring 5.4 cm on the right and 3.9 cm on the left (series 7, image 349). Electronically Signed   By: Eddie Candle M.D.   On: 09/12/2019 15:47        Scheduled Meds:  acetaminophen  1,000 mg Oral BID   aspirin EC  81 mg Oral Daily   fluticasone  1 spray Each Nare Daily   insulin pump   Subcutaneous TID AC, HS, 0200   levothyroxine  125 mcg Oral QAC breakfast   magnesium oxide  400 mg Oral BID   metoprolol tartrate  25 mg Oral BID   mometasone-formoterol  2 puff Inhalation BID   pantoprazole  40 mg Oral Daily   promethazine  12.5 mg Intravenous Once   Continuous Infusions:  sodium chloride 150 mL/hr at 09/12/19 1820   dextrose 5 % and 0.45% NaCl 175 mL/hr at 09/14/19 0639   heparin 800 Units/hr (09/13/19 2308)   insulin 1.1 Units/hr (09/14/19 1140)     LOS: 2 days    Time spent: 40 minutes    Irine Seal, MD [facility]  If 7PM-7AM, please contact night-coverage [email or web address] 09/14/2019, 12:41 PM

## 2019-09-14 NOTE — Progress Notes (Signed)
Progress Note  Patient Name: Wanda Hicks Date of Encounter: 09/14/2019  Primary Cardiologist: Sanda Klein, MD   Subjective   DKA symptoms continue to slowly improve. No angina and no dyspnea. Tolerating the higher dose of beta blocker. Murmur even less intense today.  Inpatient Medications    Scheduled Meds: . acetaminophen  1,000 mg Oral BID  . aspirin EC  81 mg Oral Daily  . fluticasone  1 spray Each Nare Daily  . insulin pump   Subcutaneous TID AC, HS, 0200  . levothyroxine  125 mcg Oral QAC breakfast  . magnesium oxide  400 mg Oral BID  . metoprolol tartrate  25 mg Oral BID  . mometasone-formoterol  2 puff Inhalation BID  . pantoprazole  40 mg Oral Daily  . promethazine  12.5 mg Intravenous Once  . sodium bicarbonate  650 mg Oral BID   Continuous Infusions: . sodium chloride 150 mL/hr at 09/12/19 1820  . dextrose 5 % and 0.45% NaCl 175 mL/hr at 09/14/19 0639  . heparin 800 Units/hr (09/13/19 2308)  . insulin 1.1 Units/hr (09/14/19 1140)   PRN Meds: ipratropium-albuterol, polyvinyl alcohol   Vital Signs    Vitals:   09/13/19 2321 09/14/19 0400 09/14/19 0849 09/14/19 1126  BP: (!) 107/54 (!) 104/53 135/60   Pulse: 64 (!) 58 73 67  Resp:   (!) 24 20  Temp: 97.8 F (36.6 C) 98.2 F (36.8 C) 98.5 F (36.9 C) 98.4 F (36.9 C)  TempSrc: Oral Oral Oral Oral  SpO2: 97% 97% 98% 98%  Weight:      Height:        Intake/Output Summary (Last 24 hours) at 09/14/2019 1247 Last data filed at 09/14/2019 5885 Gross per 24 hour  Intake 1603.27 ml  Output -  Net 1603.27 ml   Last 3 Weights 09/12/2019 09/09/2019 08/02/2018  Weight (lbs) 140 lb 140 lb 212 lb 6.4 oz  Weight (kg) 63.504 kg 63.504 kg 96.344 kg      Telemetry    NSR - Personally Reviewed  ECG    No new tracing  - Personally Reviewed  Physical Exam  Comfortable w HOB@30  degrees GEN: No acute distress.   Neck: No JVD Cardiac: RRR, precordial mid peaking 2/6 systolic murmur radiating towards  RUSB and apexno diastolic murmurs, rubs, or gallops.  Respiratory: Clear to auscultation bilaterally. GI: Soft, nontender, non-distended  MS: No edema; No deformity. Neuro:  Nonfocal  Psych: Normal affect   Labs    High Sensitivity Troponin:   Recent Labs  Lab 09/12/19 1215 09/12/19 1520 09/13/19 1010  TROPONINIHS 6 209* 6,259*      Chemistry Recent Labs  Lab 09/13/19 1953 09/14/19 0611 09/14/19 1116  NA 138 139 143  K 3.7 3.6 4.1  CL 115* 115* 117*  CO2 16* 18* 17*  GLUCOSE 199* 171* 179*  BUN 17 12 12   CREATININE 0.94 0.80 0.76  CALCIUM 8.5* 8.2* 8.7*  GFRNONAA >60 >60 >60  GFRAA >60 >60 >60  ANIONGAP 7 6 9      Hematology Recent Labs  Lab 09/12/19 1149 09/12/19 1247 09/13/19 0644 09/14/19 0611  WBC 10.5  --  12.8* 7.7  RBC 4.47  --  4.06 3.81*  HGB 14.4 14.6 12.8 12.1  HCT 42.6 43.0 39.2 36.6  MCV 95.3  --  96.6 96.1  MCH 32.2  --  31.5 31.8  MCHC 33.8  --  32.7 33.1  RDW 14.0  --  14.6 14.7  PLT 233  --  237 193    BNPNo results for input(s): BNP, PROBNP in the last 168 hours.   DDimer No results for input(s): DDIMER in the last 168 hours.   Radiology    Dg Chest 2 View  Result Date: 09/12/2019 CLINICAL DATA:  Shortness of breath and chest pain EXAM: CHEST - 2 VIEW COMPARISON:  September 11, 2007 FINDINGS: Lungs are clear. Heart size and pulmonary vascularity are normal. No adenopathy. No pneumothorax. There is postoperative change in the lower cervical region. There are foci of degenerative change in the thoracic spine. IMPRESSION: No edema or consolidation. Electronically Signed   By: Bretta Bang III M.D.   On: 09/12/2019 12:55   Ct Angio Chest/abd/pel For Dissection W And/or W/wo  Result Date: 09/12/2019 CLINICAL DATA:  Hypotension EXAM: CT ANGIOGRAPHY CHEST, ABDOMEN AND PELVIS TECHNIQUE: Multidetector CT imaging through the chest, abdomen and pelvis was performed using the standard protocol during bolus administration of intravenous  contrast. Multiplanar reconstructed images and MIPs were obtained and reviewed to evaluate the vascular anatomy. CONTRAST:  OMNIPAQUE IOHEXOL 350 MG/ML SOLN COMPARISON:  None. FINDINGS: CTA CHEST FINDINGS Cardiovascular: Preferential opacification of the thoracic aorta. Minimal thoracic aortic atherosclerosis. Normal contour and caliber of the aorta without evidence of aneurysm, dissection, or other acute aortic pathology. 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 Normal heart size. Scattered three-vessel coronary artery calcifications, including calcification near the ostium of the left main coronary artery (series 7, image 63). No pericardial effusion. Mediastinum/Nodes: No enlarged mediastinal, hilar, or axillary lymph nodes. Thyroid gland, trachea, and esophagus demonstrate no significant findings. Lungs/Pleura: Diffuse bilateral interlobular septal thickening, and peribronchovascular thickening. Trace bilateral pleural effusions. Musculoskeletal: Anasarca. No acute or significant osseous findings. Review of the MIP images confirms the above findings. Review of the MIP images confirms the above findings. CTA ABDOMEN AND PELVIS FINDINGS VASCULAR Moderate mixed calcific atherosclerosis of the aorta. No evidence of aneurysm, dissection, or other acute aortic pathology. Incidental small accessory superior pole right renal artery otherwise standard branching pattern of the aorta. Review of the MIP images confirms the above findings. NON-VASCULAR Hepatobiliary: No solid liver abnormality is seen. No gallstones, gallbladder wall thickening, or biliary dilatation. Pancreas: Unremarkable. No pancreatic ductal dilatation or surrounding inflammatory changes. Spleen: Normal in size without significant abnormality. Adrenals/Urinary Tract: Adrenal glands are unremarkable. Kidneys are normal, without renal calculi, solid lesion, or hydronephrosis. Bladder is unremarkable. Stomach/Bowel:  Stomach is within normal limits. Appendix appears normal. No evidence of bowel wall thickening, distention, or inflammatory changes. Lymphatic: No enlarged abdominal or pelvic lymph nodes. Reproductive: Fat containing bilateral ovarian dermoids, measuring 5.4 cm on the right and 3.9 cm on the left (series 7, image 349). Other: No abdominal wall hernia or abnormality. Anasarca. No abdominopelvic ascites. Musculoskeletal: No acute or significant osseous findings. Review of the MIP images confirms the above findings. IMPRESSION: 1. Normal contour and caliber of the thoracic and abdominal aorta without evidence of dissection, aneurysm, or other acute aortic pathology. Aortic Atherosclerosis (ICD10-I70.0). 2.  Pulmonary edema. 3. Scattered three-vessel coronary artery calcifications, including calcification near the ostium of the left main coronary artery (series 7, image 63). 4.  Anasarca. 5. Fat containing bilateral ovarian dermoids, measuring 5.4 cm on the right and 3.9 cm on the left (series 7, image 349). Electronically Signed   By: Lauralyn Primes M.D.   On: 09/12/2019 15:47    Cardiac Studies     1. Left ventricular ejection fraction, by visual estimation, is  65 to 70%. The left ventricle has normal function. Normal left ventricular size. There is severely increased left ventricular hypertrophy.  2. Elevated mean left atrial pressure.  3. Left ventricular diastolic Doppler parameters are consistent with impaired relaxation pattern of LV diastolic filling.  4. Global right ventricle has normal systolic function.The right ventricular size is normal.  5. Left atrial size was moderately dilated.  6. Right atrial size was normal.  7. Moderate chordal systolic anterior motion of the mitral valve.  8. Moderate mitral annular calcification.  9. The mitral valve is normal in structure. Mild mitral valve regurgitation. No evidence of mitral stenosis. 10. The tricuspid valve is normal in structure. Tricuspid  valve regurgitation is mild. 11. The aortic valve is tricuspid Aortic valve regurgitation was not visualized by color flow Doppler. Mild aortic valve sclerosis without stenosis. 12. The pulmonic valve was not well visualized. Pulmonic valve regurgitation is not visualized by color flow Doppler. 13. Mildly elevated pulmonary artery systolic pressure. 14. The inferior vena cava is normal in size with greater than 50% respiratory variability, suggesting right atrial pressure of 3 mmHg. 15. Hyperdynamic LV systolic function; asymmetric septal hypertrophy; chordal SAM with elevated LVOT gradient (4.2 m/s with valsalva); mild MR; moderate LAE; mild TR with mild pulmonary hypertension. Findings c/w HOCM.  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 HOCM: tolerating higher dose beta blocker. Murmur less intense. No signs of high risk (no personal history of syncope, FHx of unexplained sudden death, excessive LVH, etc. Onset at advanced age). Avoid diuretics (including Jardiance) and potent vasodilators. 2. CAD/NSTEMI: more marked elevation in troponin c/w NSTEMI, despite absence of angina. On IV heparin, betablockers, ASA, statin. Coronary CT angio today. 3. DM/DKA: OK to use lower doses of ARB for nephroprotection.     For questions or updates, please contact [facility] Please consult [email or web address] for contact info under        Signed, Thurmon FairMihai Tomi Grandpre, MD  09/14/2019, 12:47 PM

## 2019-09-14 NOTE — Progress Notes (Signed)
ANTICOAGULATION CONSULT NOTE  Pharmacy Consult for Heparin Indication: chest pain/ACS  Patient Measurements: Height: 5\' 4"  (162.6 cm) Weight: 140 lb (63.5 kg) IBW/kg (Calculated) : 54.7 Heparin Dosing Weight: 63.5 kg   Vital Signs: Temp: 98.5 F (36.9 C) (09/30 0849) Temp Source: Oral (09/30 0849) BP: 135/60 (09/30 0849) Pulse Rate: 73 (09/30 0849)  Labs: Recent Labs    09/12/19 1149 09/12/19 1215 09/12/19 1247 09/12/19 1520  09/13/19 0205  09/13/19 0644 09/13/19 0828 09/13/19 1010 09/13/19 1245 09/13/19 1953 09/14/19 0611  HGB 14.4  --  14.6  --   --   --   --  12.8  --   --   --   --  12.1  HCT 42.6  --  43.0  --   --   --   --  39.2  --   --   --   --  36.6  PLT 233  --   --   --   --   --   --  237  --   --   --   --  193  HEPARINUNFRC  --   --   --   --   --  0.46  --   --  0.31  --   --   --  0.43  CREATININE 1.06*  --   --   --    < >  --    < > 1.08*  --   --  0.99 0.94 0.80  TROPONINIHS  --  6  --  209*  --   --   --   --   --  6,259*  --   --   --    < > = values in this interval not displayed.    Estimated Creatinine Clearance: 64.6 mL/min (by C-G formula based on SCr of 0.8 mg/dL).  Assessment: 58 y.o. female presenting with hyperglycemia and emesis. PMH includes CHF, DM, GERD, heart murmur, HTN. No anticoagulation PTA. EKG with defuse ST depressions. Hgb 12.8, Plts 237, Scr 1.08. Pharmacy consulted for heparin dosing.   9/30 HL 0.43 is therapeutic.  Goal of Therapy:  Heparin level 0.3-0.7 units/ml Monitor platelets by anticoagulation protocol: Yes   Plan:  Continue Heparin at current 800 units/hour Daily HL and CBC Monitor s/sx of bleeding   Alanda Slim, PharmD, [facility] Clinical Pharmacist Please see AMION for all Pharmacists' Contact Phone Numbers 09/14/2019, 11:26 AM

## 2019-09-15 DIAGNOSIS — R778 Other specified abnormalities of plasma proteins: Secondary | ICD-10-CM

## 2019-09-15 LAB — BASIC METABOLIC PANEL
Anion gap: 5 (ref 5–15)
BUN: 11 mg/dL (ref 6–20)
CO2: 21 mmol/L — ABNORMAL LOW (ref 22–32)
Calcium: 8.5 mg/dL — ABNORMAL LOW (ref 8.9–10.3)
Chloride: 114 mmol/L — ABNORMAL HIGH (ref 98–111)
Creatinine, Ser: 0.68 mg/dL (ref 0.44–1.00)
GFR calc Af Amer: >60 mL/min (ref >60)
GFR calc non Af Amer: >60 mL/min (ref >60)
Glucose, Bld: 117 mg/dL — ABNORMAL HIGH (ref 70–99)
Potassium: 3.8 mmol/L (ref 3.5–5.1)
Sodium: 140 mmol/L (ref 135–145)

## 2019-09-15 LAB — CBC
HCT: 35.4 % — ABNORMAL LOW (ref 36.0–46.0)
Hemoglobin: 12 g/dL (ref 12.0–15.0)
MCH: 32.1 pg (ref 26.0–34.0)
MCHC: 33.9 g/dL (ref 30.0–36.0)
MCV: 94.7 fL (ref 80.0–100.0)
Platelets: 181 10*3/uL (ref 150–400)
RBC: 3.74 MIL/uL — ABNORMAL LOW (ref 3.87–5.11)
RDW: 14.7 % (ref 11.5–15.5)
WBC: 6.6 10*3/uL (ref 4.0–10.5)
nRBC: 0 % (ref 0.0–0.2)

## 2019-09-15 LAB — GLUCOSE, CAPILLARY
Glucose-Capillary: 109 mg/dL — ABNORMAL HIGH (ref 70–99)
Glucose-Capillary: 100 mg/dL — ABNORMAL HIGH (ref 70–99)
Glucose-Capillary: 97 mg/dL (ref 70–99)
Glucose-Capillary: 139 mg/dL — ABNORMAL HIGH (ref 70–99)

## 2019-09-15 LAB — URINE CULTURE

## 2019-09-15 MED ORDER — METOPROLOL SUCCINATE ER 50 MG PO TB24: 50.0000 mg | ORAL_TABLET | Freq: Every day | ORAL | 3 refills | Status: DC

## 2019-09-15 NOTE — Progress Notes (Signed)
Discharge instructions given. Pt verbalized understanding and all questions were answered.  

## 2019-09-15 NOTE — Progress Notes (Signed)
Progress Note  Patient Name: Wanda Hicks Date of Encounter: 09/15/2019  Primary Cardiologist: Sanda Klein, MD   Subjective   No dyspnea at rest or walking in room, no dizziness.  Inpatient Medications    Scheduled Meds:  acetaminophen  1,000 mg Oral BID   aspirin EC  81 mg Oral Daily   fluticasone  1 spray Each Nare Daily   heparin injection (subcutaneous)  5,000 Units Subcutaneous Q8H   insulin pump   Subcutaneous TID AC, HS, 0200   levothyroxine  125 mcg Oral QAC breakfast   magnesium oxide  400 mg Oral BID   metoprolol tartrate  25 mg Oral BID   mometasone-formoterol  2 puff Inhalation BID   pantoprazole  40 mg Oral Daily   promethazine  12.5 mg Intravenous Once   rosuvastatin  20 mg Oral q1800   sodium bicarbonate  650 mg Oral BID   Continuous Infusions:  sodium chloride 150 mL/hr at 09/12/19 1820   dextrose 5 % and 0.45% NaCl Stopped (09/14/19 2000)   insulin 0.5 Units/hr (09/14/19 1740)   PRN Meds: ipratropium-albuterol, polyvinyl alcohol   Vital Signs    Vitals:   09/14/19 2001 09/15/19 0010 09/15/19 0403 09/15/19 0850  BP: 123/63 (!) 124/55 (!) 137/57 125/64  Pulse: 62 (!) 59 (!) 58   Resp:    19  Temp: 98.4 F (36.9 C) 98.2 F (36.8 C) 98.5 F (36.9 C) 98.5 F (36.9 C)  TempSrc: Oral Oral Oral Oral  SpO2: 100% 100% 99%   Weight:      Height:        Intake/Output Summary (Last 24 hours) at 09/15/2019 0942 Last data filed at 09/15/2019 0830 Gross per 24 hour  Intake 2167.82 ml  Output 1100 ml  Net 1067.82 ml   Last 3 Weights 09/12/2019 09/09/2019 08/02/2018  Weight (lbs) 140 lb 140 lb 212 lb 6.4 oz  Weight (kg) 63.504 kg 63.504 kg 96.344 kg      Telemetry    NSR - Personally Reviewed  ECG    NSR, marked improvement in repol abnormalities, only subtle ST-T changes remain in I, aVL  - Personally Reviewed  Physical Exam  Appears well, comfortable lying fully flat. GEN: No acute distress.   Neck: No JVD Cardiac: RRR,  5-8/0 systolic murmur, mid peaking no diastolic murmurs, rubs, or gallops.  Respiratory: Clear to auscultation bilaterally. GI: Soft, nontender, non-distended  MS: No edema; No deformity. Neuro:  Nonfocal  Psych: Normal affect   Labs    High Sensitivity Troponin:   Recent Labs  Lab 09/12/19 1215 09/12/19 1520 09/13/19 1010  TROPONINIHS 6 209* 6,259*      Chemistry Recent Labs  Lab 09/14/19 1116 09/14/19 1802 09/15/19 0035  NA 143 141 140  K 4.1 4.4 3.8  CL 117* 114* 114*  CO2 17* 20* 21*  GLUCOSE 179* 135* 117*  BUN 12 13 11   CREATININE 0.76 0.80 0.68  CALCIUM 8.7* 8.7* 8.5*  GFRNONAA >60 >60 >60  GFRAA >60 >60 >60  ANIONGAP 9 7 5      Hematology Recent Labs  Lab 09/13/19 0644 09/14/19 0611 09/15/19 0035  WBC 12.8* 7.7 6.6  RBC 4.06 3.81* 3.74*  HGB 12.8 12.1 12.0  HCT 39.2 36.6 35.4*  MCV 96.6 96.1 94.7  MCH 31.5 31.8 32.1  MCHC 32.7 33.1 33.9  RDW 14.6 14.7 14.7  PLT 237 193 181    BNPNo results for input(s): BNP, PROBNP in the last 168 hours.  DDimer No results for input(s): DDIMER in the last 168 hours.   Radiology    Ct Coronary Morph W/cta Cor W/score W/ca W/cm &/or Wo/cm  Addendum Date: 09/14/2019   ADDENDUM REPORT: 09/14/2019 17:05 CLINICAL DATA:  Chest pain EXAM: Cardiac CTA MEDICATIONS: Sub lingual nitro. 4mg  x 2 TECHNIQUE: The patient was scanned on a Siemens 192 slice scanner. Gantry rotation speed was 250 msecs. Collimation was 0.6 mm. A 100 kV prospective scan was triggered in the ascending thoracic aorta at 35-75% of the R-R interval. Average HR during the scan was 60 bpm. The 3D data set was interpreted on a dedicated work station using MPR, MIP and VRT modes. A total of 80cc of contrast was used. FINDINGS: Non-cardiac: See separate report from [facility]. Mitral annular calcification noted. Pulmonary veins drain normally to the left atrium. Calcium Score: 198 Agatston units. Coronary Arteries: Right dominant with no anomalies  LM: There is calcification at the ostium of the left main with mild (<50%) stenosis. LAD system: Mixed plaque proximal LAD with minimal stenosis. Circumflex system: Small ramus with minimal disease. Relatively small AV LCx, mixed plaque proximally with minimal stenosis. RCA system: Mixed plaque proximal RCA, minimal stenosis. IMPRESSION: 1. Coronary artery calcium score 198 Agatston units. This places the patient in the 94th percentile for age and gender, suggesting high risk for future cardiac events. 2.  Nonobstructive coronary disease. Dalton Mclean Electronically Signed   By: [facility] M.D.   On: 09/14/2019 17:05   Result Date: 09/14/2019 EXAM: OVER-READ INTERPRETATION  CT CHEST The following report is an over-read performed by radiologist Dr. 09/16/2019 of [facility] on 09/14/2019. This over-read does not include interpretation of cardiac or coronary anatomy or pathology. The coronary CTA interpretation by the cardiologist is attached. COMPARISON:  09/12/2011 FINDINGS: Vascular: Heart is normal size. Visualized aorta normal caliber. No filling defects in the visualized pulmonary arteries to suggest pulmonary emboli. Mediastinum/Nodes: No adenopathy in the lower mediastinum or hila. Lungs/Pleura: Moderate bilateral pleural effusions, new since prior study. Compressive atelectasis in the lower lobes. Upper Abdomen: Imaging into the upper abdomen shows no acute findings. Musculoskeletal: Chest wall soft tissues are unremarkable. No acute bony abnormality. IMPRESSION: New moderate bilateral pleural effusions with compressive atelectasis in the lower lobes Electronically Signed: By: 09/14/2011 M.D. On: 09/14/2019 15:38    Cardiac Studies   CCTA 09/14/2019 Mitral annular calcification noted. Pulmonary veins drain normally to the left atrium.  Calcium Score: 198 Agatston units.  Coronary Arteries: Right dominant with no anomalies  LM: There is calcification at the ostium of the  left main with mild (<50%) stenosis.  LAD system: Mixed plaque proximal LAD with minimal stenosis.  Circumflex system: Small ramus with minimal disease. Relatively small AV LCx, mixed plaque proximally with minimal stenosis.  RCA system: Mixed plaque proximal RCA, minimal stenosis.  IMPRESSION: 1. Coronary artery calcium score 198 Agatston units. This places the patient in the 94th percentile for age and gender, suggesting high risk for future cardiac events.  2.  Nonobstructive coronary disease.  ECHO 09/13/2019 1. Left ventricular ejection fraction, by visual estimation, is 65 to 70%. The left ventricle has normal function. Normal left ventricular size. There is severely increased left ventricular hypertrophy. 2. Elevated mean left atrial pressure. 3. Left ventricular diastolic Doppler parameters are consistent with impaired relaxation pattern of LV diastolic filling. 4. Global right ventricle has normal systolic function.The right ventricular size is normal. 5. Left atrial size was moderately dilated. 6. Right  atrial size was normal. 7. Moderate chordal systolic anterior motion of the mitral valve. 8. Moderate mitral annular calcification. 9. The mitral valve is normal in structure. Mild mitral valve regurgitation. No evidence of mitral stenosis. 10. The tricuspid valve is normal in structure. Tricuspid valve regurgitation is mild. 11. The aortic valve is tricuspid Aortic valve regurgitation was not visualized by color flow Doppler. Mild aortic valve sclerosis without stenosis. 12. The pulmonic valve was not well visualized. Pulmonic valve regurgitation is not visualized by color flow Doppler. 13. Mildly elevated pulmonary artery systolic pressure. 14. The inferior vena cava is normal in size with greater than 50% respiratory variability, suggesting right atrial pressure of 3 mmHg. 15. Hyperdynamic LV systolic function; asymmetric septal hypertrophy; chordal SAM with  elevated LVOT gradient (4.2 m/s with valsalva); mild MR; moderate LAE; mild TR with mild pulmonary hypertension. Findings c/w HOCM.  Patient Profile     58 y.o. 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  Assessment & Plan    1. HOCM: tolerating higher dose beta blocker. Good clinical response. No signs of high risk (no personal history of syncope, FHx of unexplained sudden death, excessive LVH, etc., onset at advanced age). Avoid diuretics (including Jardiance) and potent vasodilators. Continue metoprolol 50 mg total  daily (will switch to metoprolol succinate) 2. CAD/NSTEMI: more marked elevation in troponin c/w NSTEMI, despite absence of angina. On betablockers, ASA, statin. Coronary CT angio showed nonobstructive disease. High Ca score is not unexpected after > 50 years type I DM. 3. DM/DKA: OK to use lower doses of ARB for nephroprotection. 4. Persistent metabolic acidosis: likely hyperchloremic acidosis due to large volume of NS administration.     [facility] will sign off.   Medication Recommendations:  Metoprolol succinate 50 mg daily. Restart losartan 25 mg daily. Stop Jardiance and HCTZ. Other recommendations (labs, testing, etc):  Per primary team Follow up as an outpatient:  Will arrange f/u in 2-4 weeks.  I gave her a letter for return to work on Sep 26, 2019.  For questions or updates, please contact [facility] Please consult [email or web address] for contact info under        Signed, Thurmon FairMihai Issac Moure, MD  09/15/2019, 9:42 AM

## 2019-09-15 NOTE — Discharge Summary (Signed)
Physician Discharge Summary  Wanda Hicks OZH:[id number] DOB: 1961/11/29 DOA: 09/12/2019  PCP: Geoffry ParadiseAronson, Richard, MD  Admit date: 09/12/2019 Discharge date: 09/15/2019  Time spent: 60 minutes  Recommendations for Outpatient Follow-up:  1. Follow-up with Dr. Royann Shiversroitoru, cardiology in 2 to 4 weeks. 2. Follow-up with Geoffry ParadiseAronson, Richard, MD in 1 to 2 weeks.  Patient's [place] PepperJardiance has been discontinued per cardiology due to patient's HOCM.  Per cardiology patient to avoid diuretics and potent vasodilators including Jardiance due to HOCM.   Discharge Diagnoses:  Principal Problem:   DKA (diabetic ketoacidoses) (HCC) Active Problems:   HOCM (hypertrophic obstructive cardiomyopathy) (HCC)   NSTEMI (non-ST elevated myocardial infarction) (HCC)   Abnormal ECG   HTN (hypertension)   Chronic diastolic heart failure (HCC)   COPD (chronic obstructive pulmonary disease) (HCC)   Elevated troponin   Discharge Condition: Stable and improved  Diet recommendation: Carb modified diet  Filed Weights   09/12/19 1311  Weight: 63.5 kg    History of present illness:  Per Dr. Ronie Spieshatterjee Novis Wanda Hicks is an 58 y.o. female with past medical history significant for type 1 diabetes for many years on an insulin pump.  States she was in her usual state of health until 3 to 4 days prior to admission when she realized that she was somewhat hypotensive and bradycardic.  She notes that she normally runs with a heart rate 80s and 90s but she dropped down to the 50s for no clear reason.  She states she cut down on her losartan and beta-blocker because of the blood pressure and bradycardia.    She states as of this morning she developed DKA.  She notes that when she goes into DKA she starts "Kussmaul breathing".  She also developed nausea and vomiting multiple times today.  Patient is very familiar with her diabetes and states that she often goes into DKA when her glucose reaches 190.  Of note she has had recent difficulty  getting her insulin delivered to her properly because she is having difficulty with her insulin pump getting kinked in her skin.  She apparently has excess skin now due to significant weight loss recently.  Denies fevers or chills or malaise.  No cough or shortness of breath although she states "my breathing was fast because I was Kussmaul breathing".  Denies any chest pain at all.  No exertional DOE.  No orthopnea or PND.  ED Course:  The patient is noted to be hypotensive with systolic in the 70s.  He was also noted to have a metabolic acidosis with an anion gap of 20 and a beta hydroxybutyrate at 4.5.  She was treated with aggressive fluid resuscitation with improvement in her blood pressure.  She was started on glucose stabilizer.  EKG showed diffuse ST depressions and she was seen by cardiology Dr. Royann Shiversroitoru who is also her outpatient cardiologist.  Hospital Course:  1 DKA type I diabetic hemoglobin A1c 6.0 Questionable etiology.  Concern for cardiac etiology.  No signs or symptoms of infectious etiology.  Patient admitted with DKA and placed on the glucose stabilizer.  Patient's insulin pump was discontinued.  Patient hydrated with IV fluids.  Patient improved clinically.  Anion gap closed.  Acidosis subsequently improved and was greater than 20 by day of discharge.  Patient improved clinically.  Patient was transitioned back to her insulin pump.  Outpatient follow-up with PCP.    2.  HOCM Noted on 2D echo.  Patient underwent cardiac CT that showed nonobstructive disease.  Patient maintained on beta-blocker.  IV fluids were subsequently discontinued once patient was off glucose stabilizer.  Cardiology recommended avoidance of diuretics and potent vasodilators as well as Jardiance which was discontinued on discharge.  Outpatient follow-up with cardiology in 2 to 4 weeks.   3.  Elevated troponin/NSTEMI/CAD Patient denies any chest pain however he presented in DKA which may have been anginal  equivalent.  Patient noted to have elevated troponin which went from 6 >>209>>>6259.  2D echo done with a EF of 65 to 70%, left ventricular diastolic Doppler parameters consistent with impaired relaxation pattern, moderate chordal systolic anterior motion of mitral valve, findings consistent with HOCM.   Patient maintained on a beta-blocker was followed by cardiology.  IV fluids were subsequently discontinued once patient was off the glucose stabilizer.  Patient also maintained on heparin and statin and aspirin.  Patient was followed by cardiology  who felt patient's elevated troponin consistent with non-STEMI however had absence of angina.  Coronary CT angiogram which was done showed nonobstructive disease.  Medications adjusted per cardiology during the hospitalization and patient be discharged home on metoprolol succinate 50 mg daily, losartan 25 mg daily.  HCTZ and Jardiance were discontinued per cardiology.  Outpatient follow-up with cardiology in 2 to 4 weeks.   4.  Hypothyroidism Patient was maintained on home regimen of Synthroid.  Will need repeat thyroid function studies done in 4 to 6 weeks.  Outpatient follow-up with PCP.   5.  Hypertension Patient maintained on metoprolol during the hospitalization.  Outpatient follow-up.    6.  History of chronic diastolic heart failure Patient noted to be volume depleted on presentation with DKA.  Patient aggressively hydrated with IV fluids.  Patient with some complaints of starting to feel bloated.    IV fluids were subsequently discontinued once DKA was resolved.  Diuretics held per cardiology.  Fluid status was monitored closely as patient with HOCM and will need to avoid significant diuresis.  Patient maintained on beta-blocker aspirin and statin.  Outpatient follow-up with cardiology.   7.  COPD Remained stable throughout the hospitalization.  Outpatient follow-up.    Procedures:  CT coronary chest pending 09/14/2019  CT angiogram chest  abdomen and pelvis 09/12/2019  2D echo 09/13/2019  Chest x-ray 09/04/2019  Consultations:  Cardiology: Dr.Croitoru 09/12/2019  Discharge Exam: Vitals:   09/15/19 0403 09/15/19 0850  BP: (!) 137/57 125/64  Pulse: (!) 58   Resp:  19  Temp: 98.5 F (36.9 C) 98.5 F (36.9 C)  SpO2: 99%     General: NAD Cardiovascular: RRR Respiratory: CTAB  Discharge Instructions   Discharge Instructions    Diet Carb Modified   Complete by: As directed    Increase activity slowly   Complete by: As directed      Allergies as of 09/15/2019      Reactions   No Known Allergies    Latex Rash   Can tolerate some latex, but if on skin for a long period of time will cause a rash      Medication List    STOP taking these medications   hydrochlorothiazide 12.5 MG capsule Commonly known as: MICROZIDE   Jardiance 10 MG Tabs tablet Generic drug: empagliflozin   traMADol 50 MG tablet Commonly known as: ULTRAM     TAKE these medications   Accu-Chek Aviva Plus test strip Generic drug: glucose blood   Accu-Chek FastClix Lancets Misc   acetaminophen 500 MG tablet Commonly known as: TYLENOL Take 1,000 mg by mouth  2 (two) times daily.   Advair Diskus 100-50 MCG/DOSE Aepb Generic drug: Fluticasone-Salmeterol Inhale 1 puff into the lungs daily as needed.   albuterol 108 (90 Base) MCG/ACT inhaler Commonly known as: VENTOLIN HFA Inhale 1 puff into the lungs every 6 (six) hours as needed for wheezing or shortness of breath.   aspirin 81 MG EC tablet Take 81 mg by mouth daily.   etodolac 400 MG tablet Commonly known as: LODINE Take 400 mg by mouth at bedtime as needed for mild pain.   fluticasone 50 MCG/ACT nasal spray Commonly known as: FLONASE Place 1 spray into both nostrils daily.   hydroxypropyl methylcellulose / hypromellose 2.5 % ophthalmic solution Commonly known as: ISOPTO TEARS / GONIOVISC Place 1 drop into both eyes as needed for dry eyes.   ICaps Areds 2 Caps Take 1  tablet by mouth 2 (two) times daily.   insulin pump Soln Inject into the skin.   KRILL OIL PO Take 1 tablet by mouth daily.   levothyroxine 125 MCG tablet Commonly known as: SYNTHROID Take 125 mcg by mouth daily before breakfast.   losartan 25 MG tablet Commonly known as: COZAAR TAKE THREE TABLETS BY MOUTH DAILY What changed: how much to take   magnesium oxide 400 MG tablet Commonly known as: MAG-OX Take 400 mg by mouth 2 (two) times daily.   metoprolol succinate 50 MG 24 hr tablet Commonly known as: Toprol XL Take 1 tablet (50 mg total) by mouth daily. What changed:   medication strength  how much to take   multivitamin tablet Take 1 tablet by mouth daily.   NovoLOG 100 UNIT/ML injection Generic drug: insulin aspart Inject into the skin daily.   PriLOSEC OTC 20 MG tablet Generic drug: omeprazole Take 1 tablet by mouth daily.   rosuvastatin 40 MG tablet Commonly known as: CRESTOR Take 40 mg by mouth daily. What changed: Another medication with the same name was removed. Continue taking this medication, and follow the directions you see here.   Turmeric 500 MG Caps Take 500 mg by mouth daily.      Allergies  Allergen Reactions  . No Known Allergies   . Latex Rash    Can tolerate some latex, but if on skin for a long period of time will cause a rash   Follow-up Information    Geoffry Paradise, MD. Schedule an appointment as soon as possible for a visit in 2 week(s).   Specialty: Internal Medicine Why: f/u in 1-2 weeks. Contact information: [address] [place] [address] [phone]        Croitoru, Rachelle Hora, MD. Schedule an appointment as soon as possible for a visit in 2 week(s).   Specialty: Cardiology Why: f/u in 2-4 weeks. Contact information: [address] [place] [address] [phone]            The results of significant diagnostics from this hospitalization (including imaging, microbiology, ancillary and  laboratory) are listed below for reference.    Significant Diagnostic Studies: Dg Chest 2 View  Result Date: 09/12/2019 CLINICAL DATA:  Shortness of breath and chest pain EXAM: CHEST - 2 VIEW COMPARISON:  September 11, 2007 FINDINGS: Lungs are clear. Heart size and pulmonary vascularity are normal. No adenopathy. No pneumothorax. There is postoperative change in the lower cervical region. There are foci of degenerative change in the thoracic spine. IMPRESSION: No edema or consolidation. Electronically Signed   By: Bretta Bang III M.D.   On: 09/12/2019 12:55  Ct Coronary Morph W/cta Cor W/score W/ca W/cm &/or Wo/cm  Addendum Date: 09/14/2019   ADDENDUM REPORT: 09/14/2019 17:05 CLINICAL DATA:  Chest pain EXAM: Cardiac CTA MEDICATIONS: Sub lingual nitro.  x 2 TECHNIQUE: The patient was scanned on a Siemens 192 slice scanner. Gantry rotation speed was 250 msecs. Collimation was 0.6 mm. A 100 kV prospective scan was triggered in the ascending thoracic aorta at 35-75% of the R-R interval. Average HR during the scan was 60 bpm. The 3D data set was interpreted on a dedicated work station using MPR, MIP and VRT modes. A total of 80cc of contrast was used. FINDINGS: Non-cardiac: See separate report from [facility]. Mitral annular calcification noted. Pulmonary veins drain normally to the left atrium. Calcium Score: 198 Agatston units. Coronary Arteries: Right dominant with no anomalies LM: There is calcification at the ostium of the left main with mild (<50%) stenosis. LAD system: Mixed plaque proximal LAD with minimal stenosis. Circumflex system: Small ramus with minimal disease. Relatively small AV LCx, mixed plaque proximally with minimal stenosis. RCA system: Mixed plaque proximal RCA, minimal stenosis. IMPRESSION: 1. Coronary artery calcium score 198 Agatston units. This places the patient in the 94th percentile for age and gender, suggesting high risk for future cardiac events. 2.   Nonobstructive coronary disease. Dalton Mclean Electronically Signed   By: Marca Ancona M.D.   On: 09/14/2019 17:05   Result Date: 09/14/2019 EXAM: OVER-READ INTERPRETATION  CT CHEST The following report is an over-read performed by radiologist Dr. Charlett Nose of [facility] on 09/14/2019. This over-read does not include interpretation of cardiac or coronary anatomy or pathology. The coronary CTA interpretation by the cardiologist is attached. COMPARISON:  09/12/2011 FINDINGS: Vascular: Heart is normal size. Visualized aorta normal caliber. No filling defects in the visualized pulmonary arteries to suggest pulmonary emboli. Mediastinum/Nodes: No adenopathy in the lower mediastinum or hila. Lungs/Pleura: Moderate bilateral pleural effusions, new since prior study. Compressive atelectasis in the lower lobes. Upper Abdomen: Imaging into the upper abdomen shows no acute findings. Musculoskeletal: Chest wall soft tissues are unremarkable. No acute bony abnormality. IMPRESSION: New moderate bilateral pleural effusions with compressive atelectasis in the lower lobes Electronically Signed: By: Charlett Nose M.D. On: 09/14/2019 15:38   Ct Angio Chest/abd/pel For Dissection W And/or W/wo  Result Date: 09/12/2019 CLINICAL DATA:  Hypotension EXAM: CT ANGIOGRAPHY CHEST, ABDOMEN AND PELVIS TECHNIQUE: Multidetector CT imaging through the chest, abdomen and pelvis was performed using the standard protocol during bolus administration of intravenous contrast. Multiplanar reconstructed images and MIPs were obtained and reviewed to evaluate the vascular anatomy. CONTRAST:  OMNIPAQUE IOHEXOL 350 MG/ML SOLN COMPARISON:  None. FINDINGS: CTA CHEST FINDINGS Cardiovascular: Preferential opacification of the thoracic aorta. Minimal thoracic aortic atherosclerosis. Normal contour and caliber of the aorta without evidence of aneurysm, dissection, or other acute aortic pathology. 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 Normal heart size. Scattered three-vessel coronary artery calcifications, including calcification near the ostium of the left main coronary artery (series 7, image 63). No pericardial effusion. Mediastinum/Nodes: No enlarged mediastinal, hilar, or axillary lymph nodes. Thyroid gland, trachea, and esophagus demonstrate no significant findings. Lungs/Pleura: Diffuse bilateral interlobular septal thickening, and peribronchovascular thickening. Trace bilateral pleural effusions. Musculoskeletal: Anasarca. No acute or significant osseous findings. Review of the MIP images confirms the above findings. Review of the MIP images confirms the above findings. CTA ABDOMEN AND PELVIS FINDINGS VASCULAR Moderate mixed calcific atherosclerosis of the aorta. No evidence of  aneurysm, dissection, or other acute aortic pathology. Incidental small accessory superior pole right renal artery otherwise standard branching pattern of the aorta. Review of the MIP images confirms the above findings. NON-VASCULAR Hepatobiliary: No solid liver abnormality is seen. No gallstones, gallbladder wall thickening, or biliary dilatation. Pancreas: Unremarkable. No pancreatic ductal dilatation or surrounding inflammatory changes. Spleen: Normal in size without significant abnormality. Adrenals/Urinary Tract: Adrenal glands are unremarkable. Kidneys are normal, without renal calculi, solid lesion, or hydronephrosis. Bladder is unremarkable. Stomach/Bowel: Stomach is within normal limits. Appendix appears normal. No evidence of bowel wall thickening, distention, or inflammatory changes. Lymphatic: No enlarged abdominal or pelvic lymph nodes. Reproductive: Fat containing bilateral ovarian dermoids, measuring 5.4 cm on the right and 3.9 cm on the left (series 7, image 349). Other: No abdominal wall hernia or abnormality. Anasarca. No abdominopelvic ascites. Musculoskeletal: No acute or significant osseous findings. Review of the  MIP images confirms the above findings. IMPRESSION: 1. Normal contour and caliber of the thoracic and abdominal aorta without evidence of dissection, aneurysm, or other acute aortic pathology. Aortic Atherosclerosis (ICD10-I70.0). 2.  Pulmonary edema. 3. Scattered three-vessel coronary artery calcifications, including calcification near the ostium of the left main coronary artery (series 7, image 63). 4.  Anasarca. 5. Fat containing bilateral ovarian dermoids, measuring 5.4 cm on the right and 3.9 cm on the left (series 7, image 349). Electronically Signed   By: Lauralyn Primes M.D.   On: 09/12/2019 15:47    Microbiology: Recent Results (from the past 240 hour(s))  SARS CORONAVIRUS 2 (TAT 6-24 HRS) Nasopharyngeal Nasopharyngeal Swab     Status: None   Collection Time: 09/12/19  4:39 PM   Specimen: Nasopharyngeal Swab  Result Value Ref Range Status   SARS Coronavirus 2 NEGATIVE NEGATIVE Final    Comment: (NOTE) SARS-CoV-2 target nucleic acids are NOT DETECTED. The SARS-CoV-2 RNA is generally detectable in upper and lower respiratory specimens during the acute phase of infection. Negative results do not preclude SARS-CoV-2 infection, do not rule out co-infections with other pathogens, and should not be used as the sole basis for treatment or other patient management decisions. Negative results must be combined with clinical observations, patient history, and epidemiological information. The expected result is Negative. Fact Sheet for Patients: HairSlick.no Fact Sheet for Healthcare Providers: quierodirigir.com This test is not yet approved or cleared by the [place] FDA and  has been authorized for detection and/or diagnosis of SARS-CoV-2 by FDA under an Emergency Use Authorization (EUA). This EUA will remain  in effect (meaning this test can be used) for the duration of the COVID-19 declaration under Section 56 4(b)(1) of the Act, 21  U.S.C. section 360bbb-3(b)(1), unless the authorization is terminated or revoked sooner. Performed at [facility], [address] [place], [place] [address]   MRSA PCR Screening     Status: None   Collection Time: 09/14/19  4:10 PM   Specimen: Nasal Mucosa; Nasopharyngeal  Result Value Ref Range Status   MRSA by PCR NEGATIVE NEGATIVE Final    Comment:        The GeneXpert MRSA Assay (FDA approved for NASAL specimens only), is one component of a comprehensive MRSA colonization surveillance program. It is not intended to diagnose MRSA infection nor to guide or monitor treatment for MRSA infections. Performed at [facility], [address] [place], [place] [address]      Labs: Basic Metabolic Panel: Recent Labs  Lab 09/12/19 1842  09/13/19 1953 09/14/19 0981 09/14/19 1914  09/14/19 1116 09/14/19 1802 09/15/19 0035  NA 139   < > 138 139  --  143 141 140  K 4.1   < > 3.7 3.6  --  4.1 4.4 3.8  CL 109   < > 115* 115*  --  117* 114* 114*  CO2 18*   < > 16* 18*  --  17* 20* 21*  GLUCOSE 133*   < > 199* 171*  --  179* 135* 117*  BUN 21*   < > 17 12  --  12 13 11   CREATININE 1.02*   < > 0.94 0.80  --  0.76 0.80 0.68  CALCIUM 8.6*   < > 8.5* 8.2*  --  8.7* 8.7* 8.5*  MG 1.9  --   --   --  2.1  --   --   --    < > = values in this interval not displayed.   Liver Function Tests: No results for input(s): AST, ALT, ALKPHOS, BILITOT, PROT, ALBUMIN in the last 168 hours. No results for input(s): LIPASE, AMYLASE in the last 168 hours. No results for input(s): AMMONIA in the last 168 hours. CBC: Recent Labs  Lab 09/12/19 1149 09/12/19 1247 09/13/19 0644 09/14/19 0611 09/15/19 0035  WBC 10.5  --  12.8* 7.7 6.6  HGB 14.4 14.6 12.8 12.1 12.0  HCT 42.6 43.0 39.2 36.6 35.4*  MCV 95.3  --  96.6 96.1 94.7  PLT 233  --  237 193 181   Cardiac Enzymes: No results for input(s): CKTOTAL, CKMB, CKMBINDEX, TROPONINI in the last 168 hours. BNP: BNP (last 3 results) No  results for input(s): BNP in the last 8760 hours.  ProBNP (last 3 results) No results for input(s): PROBNP in the last 8760 hours.  CBG: Recent Labs  Lab 09/14/19 2332 09/15/19 0207 09/15/19 0356 09/15/19 0615 09/15/19 0837  GLUCAP 122* 109* 100* 97 139*       Signed:  Irine Seal MD.  [facility] 09/15/2019, 10:31 AM

## 2019-09-17 ENCOUNTER — Telehealth: Payer: Self-pay | Admitting: Nurse Practitioner

## 2019-09-17 NOTE — Telephone Encounter (Signed)
   Pt recently admitted w/ DKA, dehydration, hypotension complicated by HOCM.  She was aggressively hydrated and left the hospital up nearly 30 lbs.  In that setting, she has had lower extremity edema as well as abdominal bloating.  Weight has slowly been coming off at home.  She was previously on HCTZ but this was held at discharge because of significant dehydration on admission.  Today, she has noted dyspnea on exertion and with minimal more activity around her home.  She is stable at rest.  Blood pressures have been running in the 150s and above.  She does still have HCTZ at home.  I recommended she take 1 dose of HCTZ 12.5 mg today in order to help stimulate some diuresis without over diuresing her.  She will try this today and is aware that if she has worsening dyspnea, she is to present to the emergency room for evaluation.  Caller verbalized understanding and was grateful for the call back.  Murray Hodgkins, NP 09/17/2019, 4:40 PM

## 2019-09-19 ENCOUNTER — Encounter: Payer: Self-pay | Admitting: Cardiovascular Disease

## 2019-09-20 ENCOUNTER — Telehealth: Payer: Self-pay | Admitting: Cardiovascular Disease

## 2019-09-21 NOTE — Telephone Encounter (Signed)
Call placed to the patient. She stated that she was having shortness of breath and an occasional cough. She has denied a fever. She saw her PCP on Monday and had an Xray which "looked like" pneumonia. She had advised that the patient see her cardiologist to  make sure it was not chf since she also had lower extremity edema.  The patient stated that she was better today and did not sound short of breath on the phone. She would still like to be seen to be evaluated just in case.

## 2019-09-21 NOTE — Telephone Encounter (Signed)
Follow Up:      Is there any way pt can be seen tomorrow by Dr C. Pt saw primary doctor and wants her to be seen this week. She is not sure if she have pneumonia or CHF. Pt says she is short of breath, better today than yesterday.

## 2019-09-21 NOTE — Telephone Encounter (Signed)
The patient has been added on 09/22/2019 with Dr. Sallyanne Kuster.

## 2019-09-22 ENCOUNTER — Ambulatory Visit: Payer: BC Managed Care – PPO | Admitting: Cardiovascular Disease

## 2019-09-22 ENCOUNTER — Other Ambulatory Visit: Payer: Self-pay

## 2019-09-22 VITALS — BP 138/51 | HR 51 | Temp 96.9°F | Ht 64.0 in | Wt 140.0 lb

## 2019-09-22 DIAGNOSIS — I421 Obstructive hypertrophic cardiomyopathy: Secondary | ICD-10-CM

## 2019-09-22 DIAGNOSIS — E108 Type 1 diabetes mellitus with unspecified complications: Secondary | ICD-10-CM

## 2019-09-22 DIAGNOSIS — I214 Non-ST elevation (NSTEMI) myocardial infarction: Secondary | ICD-10-CM | POA: Diagnosis not present

## 2019-09-22 DIAGNOSIS — I10 Essential (primary) hypertension: Secondary | ICD-10-CM | POA: Diagnosis not present

## 2019-09-22 DIAGNOSIS — I2510 Atherosclerotic heart disease of native coronary artery without angina pectoris: Secondary | ICD-10-CM | POA: Diagnosis not present

## 2019-09-22 NOTE — Progress Notes (Signed)
Cardiology Office Note:    Date:  09/23/2019   ID:  Wanda Hicks, DOB 10-05-1961, MRN [id number]  PCP:  Geoffry Paradise, MD  Cardiologist:  Thurmon Fair, MD  Electrophysiologist:  None   Referring MD: Geoffry Paradise, MD   Chief Complaint  Patient presents with   Hospitalization Follow-up    CHF, HOCM, NSTEMI, DKA    History of Present Illness:    Wanda Hicks is a 58 y.o. female with a hx of insulin-dependent diabetes mellitus since age 75, managed with an insulin pump, recently admitted with severe dehydration and diabetic ketoacidosis.  Although she never complained of chest discomfort, during that hospitalization she had marked elevation in cardiac enzymes (Peak high-sensitivity troponin 6,259), marked diffuse ST segment changes consistent with global ischemia, but no evidence of regional wall motion abnormalities on echocardiography, consistent with demand infarction.  She had very loud murmurs on physical examination and her echocardiogram showed hypertrophic obstructive cardiomyopathy physiology with moderate to severe left ventricular outflow tract obstruction and mild mitral regurgitation.  The murmur almost completely disappeared with volume resuscitation and beta-blocker therapy.  Diuretics (including SGLT 1 inhibitor) were discontinued.  Coronary CT angio showed a eccentric large calcified plaque at the ostium of the left main that was not obstructive (less than 50%) but also showed a calcium score of 198 (94th percentile).  At hospital discharge, she weighed 164.5 pounds on her home scale, compared with a baseline of 140 pounds.  She has shortness of breath that has gradually been improving.  Her weight is now down to 137 pounds on her home scale and her breathing has improved, although she still has some mild residual dyspnea.  She does not have orthopnea or PND.  She denies chest pain.  She has a cough producing pale green sputum.  She does not have any edema. She had a  chest x-ray that showed bilateral pleural effusions and bibasilar atelectasis and changes of "pneumonia versus congestive heart failure".  The x-ray was performed at [facility] medical and I cannot review the images.   She denies dizziness, palpitations or syncope.  Past Medical History:  Diagnosis Date   Arthritis    Bronchitis    Chronic diastolic heart failure (HCC) 10/19/2014   Diabetes mellitus without complication (HCC)    Type 1; pt has insulin pump   Family history of adverse reaction to anesthesia    Patients mother has N/V   GERD (gastroesophageal reflux disease)    Heart murmur    Hypertension    Pneumonia    hx of   Positive PPD    Shortness of breath dyspnea    uses Albuterol inhaler PRN    Past Surgical History:  Procedure Laterality Date   CARPAL TUNNEL RELEASE Bilateral    CERVICAL DISCECTOMY     X 2   CESAREAN SECTION     COLONOSCOPY     EYE SURGERY     as a child   MAXIMUM ACCESS (MAS)POSTERIOR LUMBAR INTERBODY FUSION (PLIF) 1 LEVEL N/A 07/10/2016   Procedure: Lumbar four-five Maximum access posterior lumbar interbody fusion with resection of synovial cyst;  Surgeon: Maeola Harman, MD;  Location: [facility];  Service: Neurosurgery;  Laterality: N/A;   TRIGGER FINGER RELEASE Bilateral    ULNAR NERVE REPAIR Bilateral     Current Medications: Current Meds  Medication Sig   ACCU-CHEK AVIVA PLUS test strip    ACCU-CHEK FASTCLIX LANCETS MISC    acetaminophen (TYLENOL) 500 MG tablet Take 1,000  mg by mouth 2 (two) times daily.   ADVAIR DISKUS 100-50 MCG/DOSE AEPB Inhale 1 puff into the lungs daily as needed.    albuterol (PROVENTIL HFA;VENTOLIN HFA) 108 (90 Base) MCG/ACT inhaler Inhale 1 puff into the lungs every 6 (six) hours as needed for wheezing or shortness of breath.   aspirin 81 MG EC tablet Take 81 mg by mouth daily.    etodolac (LODINE) 400 MG tablet Take 400 mg by mouth at bedtime as needed for mild pain.    fluticasone  (FLONASE) 50 MCG/ACT nasal spray Place 1 spray into both nostrils daily.   hydroxypropyl methylcellulose / hypromellose (ISOPTO TEARS / GONIOVISC) 2.5 % ophthalmic solution Place 1 drop into both eyes as needed for dry eyes.   Insulin Human (INSULIN PUMP) SOLN Inject into the skin.   KRILL OIL PO Take 1 tablet by mouth daily.    levothyroxine (SYNTHROID) 125 MCG tablet Take 125 mcg by mouth daily before breakfast.    losartan (COZAAR) 25 MG tablet TAKE THREE TABLETS BY MOUTH DAILY (Patient taking differently: Take 50 mg by mouth daily. )   magnesium oxide (MAG-OX) 400 MG tablet Take 400 mg by mouth 2 (two) times daily.    metoprolol succinate (TOPROL-XL) 50 MG 24 hr tablet Take 1 tablet (50 mg total) by mouth daily.   Multiple Vitamin (MULTIVITAMIN) tablet Take 1 tablet by mouth daily.   Multiple Vitamins-Minerals (ICAPS AREDS 2) CAPS Take 1 tablet by mouth 2 (two) times daily.    NOVOLOG 100 UNIT/ML injection Inject into the skin daily.    PRILOSEC OTC 20 MG tablet Take 1 tablet by mouth daily.   rosuvastatin (CRESTOR) 40 MG tablet Take 40 mg by mouth daily.   Turmeric 500 MG CAPS Take 500 mg by mouth daily.     Allergies:   No known allergies and Latex   Social History   Socioeconomic History   Marital status: Married    Spouse name: Not on file   Number of children: Not on file   Years of education: Not on file   Highest education level: Not on file  Occupational History   Not on file  Social Needs   Financial resource strain: Not on file   Food insecurity    Worry: Not on file    Inability: Not on file   Transportation needs    Medical: Not on file    Non-medical: Not on file  Tobacco Use   Smoking status: Never Smoker   Smokeless tobacco: Never Used  Substance and Sexual Activity   Alcohol use: No   Drug use: No   Sexual activity: Not on file  Lifestyle   Physical activity    Days per week: Not on file    Minutes per session: Not on file     Stress: Not on file  Relationships   Social connections    Talks on phone: Not on file    Gets together: Not on file    Attends religious service: Not on file    Active member of club or organization: Not on file    Attends meetings of clubs or organizations: Not on file    Relationship status: Not on file  Other Topics Concern   Not on file  Social History Narrative   Not on file     Family History: The patient's family history includes Alcoholism in her father; Cancer in her maternal grandfather; Hypertension in her mother; Stroke in her paternal grandfather.  ROS:   Please see the history of present illness.     All other systems reviewed and are negative.  EKGs/Labs/Other Studies Reviewed:    The following studies were reviewed today: Echocardiogram from recent hospitalization 09/13/2019  1. Left ventricular ejection fraction, by visual estimation, is 65 to 70%. The left ventricle has normal function. Normal left ventricular size. There is severely increased left ventricular hypertrophy.  2. Elevated mean left atrial pressure.  3. Left ventricular diastolic Doppler parameters are consistent with impaired relaxation pattern of LV diastolic filling.  4. Global right ventricle has normal systolic function.The right ventricular size is normal.  5. Left atrial size was moderately dilated.  6. Right atrial size was normal.  7. Moderate chordal systolic anterior motion of the mitral valve.  8. Moderate mitral annular calcification.  9. The mitral valve is normal in structure. Mild mitral valve regurgitation. No evidence of mitral stenosis. 10. The tricuspid valve is normal in structure. Tricuspid valve regurgitation is mild. 11. The aortic valve is tricuspid Aortic valve regurgitation was not visualized by color flow Doppler. Mild aortic valve sclerosis without stenosis. 12. The pulmonic valve was not well visualized. Pulmonic valve regurgitation is not visualized by color  flow Doppler. 13. Mildly elevated pulmonary artery systolic pressure. 14. The inferior vena cava is normal in size with greater than 50% respiratory variability, suggesting right atrial pressure of 3 mmHg. 15. Hyperdynamic LV systolic function; asymmetric septal hypertrophy; chordal SAM with elevated LVOT gradient (4.2 m/s with valsalva); mild MR; moderate LAE; mild TR with mild pulmonary hypertension. Findings c/w HOCM.  EKG:  EKG is  ordered today.  The ekg ordered today demonstrates his rhythm and a QS pattern in leads V1-V2, normal.  QTc 423 ms  Recent Labs: 09/14/2019: Magnesium 2.1 09/15/2019: BUN 11; Creatinine, Ser 0.68; Hemoglobin 12.0; Platelets 181; Potassium 3.8; Sodium 140   [facility] show glucose 96, BUN 15, creatinine 0.8, potassium 4.3, CO2 26, sodium 140, WBC 7.1K, hemoglobin 13.9 Recent Lipid Panel August 31, 2019 total cholesterol 151, HDL 44, LDL 93, triglycerides 70, hemoglobin A1c 6.0  Physical Exam:    VS:  BP (!) 138/51    Pulse (!) 51    Temp (!) 96.9 F (36.1 C)    Ht 5\' 4"  (1.626 m)    Wt 140 lb (63.5 kg)    SpO2 97%    BMI 24.03 kg/m     Wt Readings from Last 3 Encounters:  09/22/19 140 lb (63.5 kg)  09/12/19 140 lb (63.5 kg)  09/09/19 140 lb (63.5 kg)     GEN:  Well nourished, well developed in no acute distress HEENT: Normal NECK: No JVD; No carotid bruits LYMPHATICS: No lymphadenopathy CARDIAC: RRR, no diastolic murmurs, rubs, gallops.  There is a very faint 1/6 holosystolic murmur heard best in the mid precordial area that intensifies with the Valsalva maneuver and virtually disappears with handgrip RESPIRATORY:  Clear to auscultation without rales, wheezing or rhonchi  ABDOMEN: Soft, non-tender, non-distended MUSCULOSKELETAL:  No edema; No deformity  SKIN: Warm and dry NEUROLOGIC:  Alert and oriented x 3 PSYCHIATRIC:  Normal affect   ASSESSMENT:    1. Coronary artery disease involving native coronary artery of  native heart without angina pectoris   2. Non-ST elevation (NSTEMI) myocardial infarction (HCC)   3. HOCM (hypertrophic obstructive cardiomyopathy) (HCC)   4. Type 1 diabetes mellitus with complications (HCC)   5. Essential hypertension    PLAN:  In order of problems listed above:  1. CAD s/p NSTEMI: She never experienced angina pectoris and there were no regional wall motion abnormalities seen.  I think she had diffuse global ischemia due to the extreme hemodynamic changes associated with diabetic ketoacidosis, severe hypotension and marked LV outflow tract obstruction.  However she is at risk for progression of atherosclerosis and requires aggressive risk factor modification.  Diabetes control is excellent.  LDL cholesterol target should be less than 70 (we are very close to the target right now).  She needs to remain physically active.  She is not a smoker.  Her blood pressure is well controlled. 2. HOCM: She has all the anatomical and functional characteristics of this disorder, but it only became clinically apparent when she was in extremis, during severe volume depletion due to DKA.  The peak gradient with provocative maneuvers was about 70 mmHg.  The fact that she has done so well at the age of 64 suggests that she has a very benign mutation.  There is no family history of unexplained sudden death or premature death from arrhythmia.  She does not have extreme hypertrophy.  She does not have a history of ventricular arrhythmia.  At this point her murmur is almost completely inaudible.  Cannot increase the dose of beta-blocker further due to sinus bradycardia. 3. DM type 1: She is extremely insulin sensitive.  Unfortunately, I do not think SGLT2 inhibitors are a good idea in her situation.  She has not had serious problems with hypoglycemia unawareness and the dose of beta-blocker is not particularly high. 4. HTN: Prefer the use of beta-blockers for blood pressure control and diuretics and potent  vasodilators should be avoided.    Medication Adjustments/Labs and Tests Ordered: Current medicines are reviewed at length with the patient today.  Concerns regarding medicines are outlined above.  Orders Placed This Encounter  Procedures   EKG 12-Lead   No orders of the defined types were placed in this encounter.   Patient Instructions  Medication Instructions:  Your physician recommends that you continue on your current medications as directed. Please refer to the Current Medication list given to you today.  If you need a refill on your cardiac medications before your next appointment, please call your pharmacy.   Lab work: None ordered If you have labs (blood work) drawn today and your tests are completely normal, you will receive your results only by: MyChart Message (if you have MyChart) OR A paper copy in the mail If you have any lab test that is abnormal or we need to change your treatment, we will call you to review the results.  Testing/Procedures: None ordered  Follow-Up: At [facility], you and your health needs are our priority.  As part of our continuing mission to provide you with exceptional heart care, we have created designated Provider Care Teams.  These Care Teams include your primary Cardiologist (physician) and Advanced Practice Providers (APPs -  Physician Assistants and Nurse Practitioners) who all work together to provide you with the care you need, when you need it. You will need a follow up appointment in 3 months.  Please call our office 2 months in advance to schedule this appointment.  You may see Thurmon Fair, MD or one of the following Advanced Practice Providers on your designated Care Team: Azalee Course, PA-C Micah Flesher, PA-C           Signed, Thurmon Fair, MD  09/23/2019 2:50 PM    [facility] ° °

## 2019-09-22 NOTE — Patient Instructions (Signed)
Medication Instructions:  Your physician recommends that you continue on your current medications as directed. Please refer to the Current Medication list given to you today.  If you need a refill on your cardiac medications before your next appointment, please call your pharmacy.   Lab work: None ordered If you have labs (blood work) drawn today and your tests are completely normal, you will receive your results only by: MyChart Message (if you have MyChart) OR A paper copy in the mail If you have any lab test that is abnormal or we need to change your treatment, we will call you to review the results.  Testing/Procedures: None ordered  Follow-Up: At [facility], you and your health needs are our priority.  As part of our continuing mission to provide you with exceptional heart care, we have created designated Provider Care Teams.  These Care Teams include your primary Cardiologist (physician) and Advanced Practice Providers (APPs -  Physician Assistants and Nurse Practitioners) who all work together to provide you with the care you need, when you need it. You will need a follow up appointment in 3 months.  Please call our office 2 months in advance to schedule this appointment.  You may see Mihai Croitoru, MD or one of the following Advanced Practice Providers on your designated Care Team: Hao Meng, PA-C Angela Duke, PA-C       

## 2019-09-23 ENCOUNTER — Encounter: Payer: Self-pay | Admitting: Cardiovascular Disease

## 2019-09-26 ENCOUNTER — Ambulatory Visit: Payer: BC Managed Care – PPO | Admitting: Physician Assistant

## 2019-10-07 ENCOUNTER — Ambulatory Visit: Payer: BC Managed Care – PPO | Admitting: Cardiovascular Disease

## 2019-10-11 ENCOUNTER — Telehealth: Payer: Self-pay | Admitting: Cardiovascular Disease

## 2019-10-11 MED ORDER — HYDROCHLOROTHIAZIDE 12.5 MG PO CAPS: 12.5000 mg | ORAL_CAPSULE | Freq: Every day | ORAL | 3 refills | Status: DC

## 2019-10-11 NOTE — Telephone Encounter (Signed)
Called patient to discuss SOB- patient did not answer, LVM- left call back number.

## 2019-10-11 NOTE — Telephone Encounter (Signed)
Refilled Rx per request.

## 2019-10-11 NOTE — Telephone Encounter (Signed)
OK to restart HCTZ 12.5 mg daily

## 2019-10-11 NOTE — Telephone Encounter (Signed)
Pt was concerned with taking HCTZ. She says she felt better overall while taking this medication. She said she has been slightly SOB and recently had an increased in weight. Over the weekend she went from 135lbs to 138lbs. She took a HCTZ and her weight went back down to 135lbs. She wants to know if she can go back on this medication. Advised pt that if she has significant weight gain like that to call the office to get recommendations on what to do. Also advised if she becomes SOB and she is having a hard time breathing to call 911. Verbalized understanding. Will route to provider for review.

## 2019-10-11 NOTE — Telephone Encounter (Signed)
Pt c/o Shortness Of Breath: STAT if SOB developed within the last 24 hours or pt is noticeably SOB on the phone  1. Are you currently SOB (can you hear that pt is SOB on the phone)? A little 2. How long have you been experiencing SOB? This morning   3. Are you SOB when sitting or when up moving around? More when she is moving around  4. Are you currently experiencing any other symptoms? No Patient seems to think she need medication.

## 2019-12-29 ENCOUNTER — Other Ambulatory Visit: Payer: BC Managed Care – PPO

## 2019-12-30 ENCOUNTER — Ambulatory Visit: Payer: BC Managed Care – PPO | Attending: Internal Medicine

## 2019-12-30 DIAGNOSIS — Z20822 Contact with and (suspected) exposure to covid-19: Secondary | ICD-10-CM

## 2019-12-31 LAB — NOVEL CORONAVIRUS, NAA: SARS-CoV-2, NAA: NOT DETECTED

## 2020-01-11 ENCOUNTER — Other Ambulatory Visit: Payer: Self-pay

## 2020-01-11 ENCOUNTER — Ambulatory Visit: Payer: BC Managed Care – PPO | Admitting: Cardiovascular Disease

## 2020-01-11 VITALS — BP 128/52 | HR 69 | Ht 64.0 in | Wt 134.8 lb

## 2020-01-11 DIAGNOSIS — E109 Type 1 diabetes mellitus without complications: Secondary | ICD-10-CM | POA: Diagnosis not present

## 2020-01-11 DIAGNOSIS — I2510 Atherosclerotic heart disease of native coronary artery without angina pectoris: Secondary | ICD-10-CM

## 2020-01-11 DIAGNOSIS — I421 Obstructive hypertrophic cardiomyopathy: Secondary | ICD-10-CM

## 2020-01-11 DIAGNOSIS — I10 Essential (primary) hypertension: Secondary | ICD-10-CM | POA: Diagnosis not present

## 2020-01-11 MED ORDER — HYDROCHLOROTHIAZIDE 12.5 MG PO CAPS: ORAL_CAPSULE | ORAL | 5 refills | Status: DC

## 2020-01-11 NOTE — Progress Notes (Signed)
Cardiology Office Note:    Date:  01/13/2020   ID:  Wanda Hicks, DOB [birth year], MRN [id number]  PCP:  Geoffry Paradise, MD  Cardiologist:  Thurmon Fair, MD  Electrophysiologist:  None   Referring MD: Geoffry Paradise, MD   Chief Complaint  Patient presents with  . Cardiomyopathy    History of Present Illness:    Wanda Hicks is a 59 y.o. female with a hx of insulin-dependent diabetes mellitus since age 48, managed with an insulin pump, recently admitted with severe dehydration and diabetic ketoacidosis.  Although she never complained of chest discomfort, during that hospitalization she had marked elevation in cardiac enzymes (Peak high-sensitivity troponin 6,259), marked diffuse ST segment changes consistent with global ischemia, but no evidence of regional wall motion abnormalities on echocardiography, consistent with demand infarction.  She had very loud murmurs on physical examination and her echocardiogram showed hypertrophic obstructive cardiomyopathy physiology with moderate to severe left ventricular outflow tract obstruction and mild mitral regurgitation.  The murmur almost completely disappeared with volume resuscitation and beta-blocker therapy.  Diuretics (including SGLT 1 inhibitor) were discontinued.  Coronary CT angio showed a eccentric large calcified plaque at the ostium of the left main that was not obstructive (less than 50%) but also showed a calcium score of 198 (94th percentile).  Since that hospitalization she has been doing better, but has been consumed by the declining health of her mother, who is now under hospice care.  She has lost a lot of weight.  She has not had problems with hypoglycemia or DKA.  She denies angina or dyspnea at rest or with activity.  She does have orthostatic dizziness if she stands up quickly.  She denies dizziness, palpitations or syncope.  She has not had lower extremity edema, claudication or focal neurological events.  She now  weighs only 134 pounds.  She appears to be euvolemic clinically.  Past Medical History:  Diagnosis Date  . Arthritis   . Bronchitis   . Chronic diastolic heart failure (HCC) 10/19/2014  . Diabetes mellitus without complication (HCC)    Type 1; pt has insulin pump  . Family history of adverse reaction to anesthesia    Patients mother has N/V  . GERD (gastroesophageal reflux disease)   . Heart murmur   . Hypertension   . Pneumonia    hx of  . Positive PPD   . Shortness of breath dyspnea    uses Albuterol inhaler PRN    Past Surgical History:  Procedure Laterality Date  . CARPAL TUNNEL RELEASE Bilateral   . CERVICAL DISCECTOMY     X 2  . CESAREAN SECTION    . COLONOSCOPY    . EYE SURGERY     as a child  . MAXIMUM ACCESS (MAS)POSTERIOR LUMBAR INTERBODY FUSION (PLIF) 1 LEVEL N/A 07/10/2016   Procedure: Lumbar four-five Maximum access posterior lumbar interbody fusion with resection of synovial cyst;  Surgeon: Maeola Harman, MD;  Location: [facility];  Service: Neurosurgery;  Laterality: N/A;  . TRIGGER FINGER RELEASE Bilateral   . ULNAR NERVE REPAIR Bilateral     Current Medications: Current Meds  Medication Sig  . ACCU-CHEK AVIVA PLUS test strip   . ACCU-CHEK FASTCLIX LANCETS MISC   . acetaminophen (TYLENOL) 500 MG tablet Take 1,000 mg by mouth 2 (two) times daily.  Marland Kitchen ADVAIR DISKUS 100-50 MCG/DOSE AEPB Inhale 1 puff into the lungs daily as needed.   Marland Kitchen albuterol (PROVENTIL HFA;VENTOLIN HFA) 108 (90 Base) MCG/ACT inhaler Inhale  1 puff into the lungs every 6 (six) hours as needed for wheezing or shortness of breath.  Marland Kitchen aspirin 81 MG EC tablet Take 81 mg by mouth daily.   Marland Kitchen etodolac (LODINE) 400 MG tablet Take 400 mg by mouth at bedtime as needed for mild pain.   . fluticasone (FLONASE) 50 MCG/ACT nasal spray Place 1 spray into both nostrils daily.  . hydroxypropyl methylcellulose / hypromellose (ISOPTO TEARS / GONIOVISC) 2.5 % ophthalmic solution Place 1 drop into both eyes as  needed for dry eyes.  . Insulin Human (INSULIN PUMP) SOLN Inject into the skin.  Marland Kitchen KRILL OIL PO Take 1 tablet by mouth daily.   Marland Kitchen levothyroxine (SYNTHROID) 125 MCG tablet Take 125 mcg by mouth daily before breakfast.   . losartan (COZAAR) 25 MG tablet TAKE THREE TABLETS BY MOUTH DAILY (Patient taking differently: Take 50 mg by mouth daily. )  . magnesium oxide (MAG-OX) 400 MG tablet Take 400 mg by mouth 2 (two) times daily.   . metoprolol succinate (TOPROL-XL) 50 MG 24 hr tablet Take 1 tablet (50 mg total) by mouth daily.  . Multiple Vitamin (MULTIVITAMIN) tablet Take 1 tablet by mouth daily.  . Multiple Vitamins-Minerals (ICAPS AREDS 2) CAPS Take 1 tablet by mouth 2 (two) times daily.   Marland Kitchen NOVOLOG 100 UNIT/ML injection Inject into the skin daily.   . rosuvastatin (CRESTOR) 40 MG tablet Take 40 mg by mouth daily.  . Turmeric 500 MG CAPS Take 500 mg by mouth daily.     Allergies:   No known allergies and Latex   Social History   Socioeconomic History  . Marital status: Married    Spouse name: Not on file  . Number of children: Not on file  . Years of education: Not on file  . Highest education level: Not on file  Occupational History  . Not on file  Tobacco Use  . Smoking status: Never Smoker  . Smokeless tobacco: Never Used  Substance and Sexual Activity  . Alcohol use: No  . Drug use: No  . Sexual activity: Not on file  Other Topics Concern  . Not on file  Social History Narrative  . Not on file   Social Determinants of Health   Financial Resource Strain:   . Difficulty of Paying Living Expenses: Not on file  Food Insecurity:   . Worried About Programme researcher, broadcasting/film/video in the Last Year: Not on file  . Ran Out of Food in the Last Year: Not on file  Transportation Needs:   . Lack of Transportation (Medical): Not on file  . Lack of Transportation (Non-Medical): Not on file  Physical Activity:   . Days of Exercise per Week: Not on file  . Minutes of Exercise per Session: Not  on file  Stress:   . Feeling of Stress : Not on file  Social Connections:   . Frequency of Communication with Friends and Family: Not on file  . Frequency of Social Gatherings with Friends and Family: Not on file  . Attends Religious Services: Not on file  . Active Member of Clubs or Organizations: Not on file  . Attends Banker Meetings: Not on file  . Marital Status: Not on file     Family History: The patient's family history includes Alcoholism in her father; Cancer in her maternal grandfather; Hypertension in her mother; Stroke in her paternal grandfather.  ROS:   Please see the history of present illness.    All  other systems are reviewed and are negative.   EKGs/Labs/Other Studies Reviewed:    The following studies were reviewed today: Echocardiogram from recent hospitalization 09/13/2019  1. Left ventricular ejection fraction, by visual estimation, is 65 to 70%. The left ventricle has normal function. Normal left ventricular size. There is severely increased left ventricular hypertrophy.  2. Elevated mean left atrial pressure.  3. Left ventricular diastolic Doppler parameters are consistent with impaired relaxation pattern of LV diastolic filling.  4. Global right ventricle has normal systolic function.The right ventricular size is normal.  5. Left atrial size was moderately dilated.  6. Right atrial size was normal.  7. Moderate chordal systolic anterior motion of the mitral valve.  8. Moderate mitral annular calcification.  9. The mitral valve is normal in structure. Mild mitral valve regurgitation. No evidence of mitral stenosis. 10. The tricuspid valve is normal in structure. Tricuspid valve regurgitation is mild. 11. The aortic valve is tricuspid Aortic valve regurgitation was not visualized by color flow Doppler. Mild aortic valve sclerosis without stenosis. 12. The pulmonic valve was not well visualized. Pulmonic valve regurgitation is not visualized by  color flow Doppler. 13. Mildly elevated pulmonary artery systolic pressure. 14. The inferior vena cava is normal in size with greater than 50% respiratory variability, suggesting right atrial pressure of 3 mmHg. 15. Hyperdynamic LV systolic function; asymmetric septal hypertrophy; chordal SAM with elevated LVOT gradient (4.2 m/s with valsalva); mild MR; moderate LAE; mild TR with mild pulmonary hypertension. Findings c/w HOCM.  EKG:  EKG is  ordered today.  The ekg ordered today demonstrates his rhythm and a QS pattern in leads V1-V2, normal.  QTc 423 ms  Recent Labs: 09/14/2019: Magnesium 2.1 09/15/2019: BUN 11; Creatinine, Ser 0.68; Hemoglobin 12.0; Platelets 181; Potassium 3.8; Sodium 140   September 19, 2019 labs at [facility] medical show glucose 96, BUN 15, creatinine 0.8, potassium 4.3, CO2 26, sodium 140, WBC 7.1K, hemoglobin 13.9 Recent Lipid Panel August 31, 2019 total cholesterol 151, HDL 44, LDL 93, triglycerides 70, hemoglobin A1c 6.0  Physical Exam:    VS:  BP (!) 128/52   Pulse 69   Ht 5\' 4"  (1.626 m)   Wt 134 lb 12.8 oz (61.1 kg)   BMI 23.14 kg/m     Wt Readings from Last 3 Encounters:  01/11/20 134 lb 12.8 oz (61.1 kg)  09/22/19 140 lb (63.5 kg)  09/12/19 140 lb (63.5 kg)      General: Alert, oriented x3, no distress, looks worried, but physically well. Head: no evidence of trauma, PERRL, EOMI, no exophtalmos or lid lag, no myxedema, no xanthelasma; normal ears, nose and oropharynx Neck: normal jugular venous pulsations and no hepatojugular reflux; brisk carotid pulses without delay and no carotid bruits Chest: clear to auscultation, no signs of consolidation by percussion or palpation, normal fremitus, symmetrical and full respiratory excursions Cardiovascular: normal position and quality of the apical impulse, regular rhythm, normal first and second heart sounds, there is a dynamic systolic murmur heard both at the apex and the left sternal border that intensifies  with a Valsalva maneuver (2/6 at rest, 3/6 after provocative maneuvers) and diminishes with handgrip and standing up, no diastolic murmurs, rubs or gallops Abdomen: no tenderness or distention, no masses by palpation, no abnormal pulsatility or arterial bruits, normal bowel sounds, no hepatosplenomegaly Extremities: no clubbing, cyanosis or edema; 2+ radial, ulnar and brachial pulses bilaterally; 2+ right femoral, posterior tibial and dorsalis pedis pulses; 2+ left femoral, posterior tibial and dorsalis pedis  pulses; no subclavian or femoral bruits Neurological: grossly nonfocal Psych: Normal mood and affect   ASSESSMENT:    1. Coronary artery disease involving native coronary artery of native heart without angina pectoris   2. Obstructive hypertrophic cardiomyopathy ([place])   3. Insulin dependent diabetes mellitus type IA ([place])   4. Essential hypertension    PLAN:    In order of problems listed above: 1. CAD: Asymptomatic.  Has well-controlled risk factors including excellent glycemic and blood pressure control but target LDL should be less than 70.  On statin.  Has repeat labs coming up in the near future with PCP. 2. 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  Important to continue beta-blockers.  Try to eliminate diuretics and avoid all potent vasodilators.  She is worried that she will have problems with edema if she stops the hydrochlorothiazide altogether so to start we will reduce it to every other day.  Would like to repeat an echocardiogram to see what her gradient does after we stop her diuretics. 3. DM type 1: She is extremely insulin sensitive.  Very well controlled on insulin pump. 4. HTN: Prefer the use of beta-blockers for blood pressure control and diuretics and potent vasodilators should be avoided.  ARB's do offer some advantage for renal protection in the  setting of insulin sensitive diabetes.  Excellent blood pressure today, actually with quite low diastolic blood pressure.  She has occasional symptoms of orthostatic dizziness.  Will gradually discontinue the hydrochlorothiazide.   Medication Adjustments/Labs and Tests Ordered: Current medicines are reviewed at length with the patient today.  Concerns regarding medicines are outlined above.  Orders Placed This Encounter  Procedures  . EKG 12-Lead   Meds ordered this encounter  Medications  . hydrochlorothiazide (MICROZIDE) 12.5 MG capsule    Sig: Take one tablet on Tuesday, Thursday, Saturday and Sunday    Dispense:  20 capsule    Refill:  5    Patient Instructions  Medication Instructions:  CHANGE: how you take the Hydrochlorothiazide to one tablet on Tuesday, Thursday, Saturday and Sunday.  *If you need a refill on your cardiac medications before your next appointment, please call your pharmacy*  Lab Work: None ordered If you have labs (blood work) drawn today and your tests are completely normal, you will receive your results only by: Marland Kitchen MyChart Message (if you have MyChart) OR . A paper copy in the mail If you have any lab test that is abnormal or we need to change your treatment, we will call you to review the results.  Testing/Procedures: None ordered  Follow-Up: At [facility], you and your health needs are our priority.  As part of our continuing mission to provide you with exceptional heart care, we have created designated Provider Care Teams.  These Care Teams include your primary Cardiologist (physician) and Advanced Practice Providers (APPs -  Physician Assistants and Nurse Practitioners) who all work together to provide you with the care you need, when you need it.  Your next appointment:   6 month(s)  The format for your next appointment:   In Person  Provider:   Sanda Klein, MD       Signed, Sanda Klein, MD  01/13/2020 6:19 PM    [place]

## 2020-01-11 NOTE — Patient Instructions (Signed)
Medication Instructions:  CHANGE: how you take the Hydrochlorothiazide to one tablet on Tuesday, Thursday, Saturday and Sunday.  *If you need a refill on your cardiac medications before your next appointment, please call your pharmacy*  Lab Work: None ordered If you have labs (blood work) drawn today and your tests are completely normal, you will receive your results only by: Marland Kitchen MyChart Message (if you have MyChart) OR . A paper copy in the mail If you have any lab test that is abnormal or we need to change your treatment, we will call you to review the results.  Testing/Procedures: None ordered  Follow-Up: At [facility], you and your health needs are our priority.  As part of our continuing mission to provide you with exceptional heart care, we have created designated Provider Care Teams.  These Care Teams include your primary Cardiologist (physician) and Advanced Practice Providers (APPs -  Physician Assistants and Nurse Practitioners) who all work together to provide you with the care you need, when you need it.  Your next appointment:   6 month(s)  The format for your next appointment:   In Person  Provider:   Thurmon Fair, MD

## 2020-01-13 ENCOUNTER — Encounter: Payer: Self-pay | Admitting: Cardiovascular Disease

## 2020-01-13 ENCOUNTER — Ambulatory Visit: Payer: BC Managed Care – PPO | Attending: Internal Medicine

## 2020-01-13 DIAGNOSIS — Z20822 Contact with and (suspected) exposure to covid-19: Secondary | ICD-10-CM

## 2020-01-14 LAB — NOVEL CORONAVIRUS, NAA: SARS-CoV-2, NAA: NOT DETECTED

## 2020-01-18 NOTE — Addendum Note (Signed)
Addended by: Brunetta Genera on: 01/18/2020 10:08 AM   Modules accepted: Orders

## 2020-01-20 ENCOUNTER — Ambulatory Visit: Payer: BC Managed Care – PPO | Attending: Internal Medicine

## 2020-01-20 DIAGNOSIS — Z20822 Contact with and (suspected) exposure to covid-19: Secondary | ICD-10-CM

## 2020-01-21 LAB — NOVEL CORONAVIRUS, NAA: SARS-CoV-2, NAA: NOT DETECTED

## 2020-01-27 ENCOUNTER — Ambulatory Visit: Payer: BC Managed Care – PPO | Attending: Internal Medicine

## 2020-01-27 DIAGNOSIS — Z20822 Contact with and (suspected) exposure to covid-19: Secondary | ICD-10-CM

## 2020-01-28 LAB — NOVEL CORONAVIRUS, NAA: SARS-CoV-2, NAA: NOT DETECTED

## 2020-02-06 ENCOUNTER — Other Ambulatory Visit: Payer: BC Managed Care – PPO

## 2020-02-10 ENCOUNTER — Ambulatory Visit: Payer: BC Managed Care – PPO | Attending: Internal Medicine

## 2020-02-10 DIAGNOSIS — Z20822 Contact with and (suspected) exposure to covid-19: Secondary | ICD-10-CM

## 2020-02-11 LAB — NOVEL CORONAVIRUS, NAA: SARS-CoV-2, NAA: NOT DETECTED

## 2020-02-27 ENCOUNTER — Ambulatory Visit: Payer: BC Managed Care – PPO | Attending: Internal Medicine

## 2020-02-27 DIAGNOSIS — Z20822 Contact with and (suspected) exposure to covid-19: Secondary | ICD-10-CM

## 2020-02-28 LAB — NOVEL CORONAVIRUS, NAA: SARS-CoV-2, NAA: NOT DETECTED

## 2020-03-26 ENCOUNTER — Ambulatory Visit: Payer: BC Managed Care – PPO | Admitting: Podiatry

## 2020-06-07 ENCOUNTER — Telehealth: Payer: Self-pay | Admitting: Cardiovascular Disease

## 2020-06-07 NOTE — Telephone Encounter (Signed)
STAT if patient feels like he/she is going to faint   1) Are you dizzy now? no  2) Do you feel faint or have you passed out? Not right now - states it only lasts a few seconds.   3) Do you have any other symptoms? States that she does feel faint/like she is going to pass out when she does get dizzy. States she had to grab the wall to catch herself.   4) Have you checked your HR and BP (record if available)? BP 112/60's HR 52   Will be at her work number until 3, after that call her cell phone at [phone]. She has a recall for after July 26th, does patient need to be seen sooner?

## 2020-06-07 NOTE — Telephone Encounter (Signed)
Spoke with pt who report she has experienced 2 episodes of feeling faint that only last about 10 seconds. She report Tuesday she was at home when symptoms occurred and had to grab the walk for stability. BP 112/60 HR 52. Today she report symptom occurred after walking up a flight of stairs. She report once removing her mask and taking deep breaths, symptoms subsided. Pt denies CP, SOB, or any other symptoms.  Appointment scheduled for 6/30 with Joni Reining, DNP. Nurse advised pt to seek emergent if symptoms reoccur and she feels like she is about to pass out. Pt verbalized understanding.

## 2020-06-13 ENCOUNTER — Telehealth: Payer: Self-pay

## 2020-06-13 ENCOUNTER — Encounter: Payer: Self-pay | Admitting: Adult Health

## 2020-06-13 ENCOUNTER — Ambulatory Visit: Payer: BC Managed Care – PPO | Admitting: Adult Health

## 2020-06-13 ENCOUNTER — Other Ambulatory Visit: Payer: Self-pay

## 2020-06-13 VITALS — BP 132/60 | HR 54 | Ht 64.0 in | Wt 137.0 lb

## 2020-06-13 DIAGNOSIS — E108 Type 1 diabetes mellitus with unspecified complications: Secondary | ICD-10-CM

## 2020-06-13 DIAGNOSIS — I2510 Atherosclerotic heart disease of native coronary artery without angina pectoris: Secondary | ICD-10-CM

## 2020-06-13 DIAGNOSIS — Z79899 Other long term (current) drug therapy: Secondary | ICD-10-CM

## 2020-06-13 DIAGNOSIS — E079 Disorder of thyroid, unspecified: Secondary | ICD-10-CM

## 2020-06-13 DIAGNOSIS — I421 Obstructive hypertrophic cardiomyopathy: Secondary | ICD-10-CM

## 2020-06-13 DIAGNOSIS — J42 Unspecified chronic bronchitis: Secondary | ICD-10-CM

## 2020-06-13 DIAGNOSIS — R42 Dizziness and giddiness: Secondary | ICD-10-CM

## 2020-06-13 NOTE — Patient Instructions (Addendum)
Medication Instructions:  DECREASE- HCTz to 3 times a week  *If you need a refill on your cardiac medications before your next appointment, please call your pharmacy*   Lab Work: BMP and HgB A1c  If you have labs (blood work) drawn today and your tests are completely normal, you will receive your results only by: Marland Kitchen MyChart Message (if you have MyChart) OR . A paper copy in the mail If you have any lab test that is abnormal or we need to change your treatment, we will call you to review the results.   Testing/Procedures: Your physician has recommended that you wear a Zio monitor. Holter monitors are medical devices that record the heart's electrical activity. Doctors most often use these monitors to diagnose arrhythmias. Arrhythmias are problems with the speed or rhythm of the heartbeat. The monitor is a small, portable device. You can wear one while you do your normal daily activities. This is usually used to diagnose what is causing palpitations/syncope (passing out).   Follow-Up: At [facility], you and your health needs are our priority.  As part of our continuing mission to provide you with exceptional heart care, we have created designated Provider Care Teams.  These Care Teams include your primary Cardiologist (physician) and Advanced Practice Providers (APPs -  Physician Assistants and Nurse Practitioners) who all work together to provide you with the care you need, when you need it.  We recommend signing up for the patient portal called "MyChart".  Sign up information is provided on this After Visit Summary.  MyChart is used to connect with patients for Virtual Visits (Telemedicine).  Patients are able to view lab/test results, encounter notes, upcoming appointments, etc.  Non-urgent messages can be sent to your provider as well.   To learn more about what you can do with MyChart, go to [email or web address].    Your next appointment:   6 week(s)  The format for your next  appointment:   In Person  Provider:   You may see Thurmon Fair, MD or one of the following Advanced Practice Providers on your designated Care Team:    Azalee Course, PA-C  Micah Flesher, [place] or   Judy Pimple, [place]    Other Instructions  Christena Deem- Long Term Monitor Instructions   Your physician has requested you wear your ZIO patch monitor 14 days.   This is a single patch monitor.  [employer] supplies one patch monitor per enrollment.  Additional stickers are not available.   Please do not apply patch if you will be having a Nuclear Stress Test, Echocardiogram, Cardiac CT, MRI, or Chest Xray during the time frame you would be wearing the monitor. The patch cannot be worn during these tests.  You cannot remove and re-apply the ZIO XT patch monitor.   Your ZIO patch monitor will be sent USPS Priority mail from [facility] directly to your home address. The monitor may also be mailed to a PO BOX if home delivery is not available.   It may take 3-5 days to receive your monitor after you have been enrolled.   Once you have received you monitor, please review enclosed instructions.  Your monitor has already been registered assigning a specific monitor serial # to you.   Applying the monitor   Shave hair from upper left chest.   Hold abrader disc by orange tab.  Rub abrader in 40 strokes over left upper chest as indicated in your monitor instructions.   Clean area with 4 enclosed  alcohol pads .  Use all pads to assure are is cleaned thoroughly.  Let dry.   Apply patch as indicated in monitor instructions.  Patch will be place under collarbone on left side of chest with arrow pointing upward.   Rub patch adhesive wings for 2 minutes.Remove white label marked "1".  Remove white label marked "2".  Rub patch adhesive wings for 2 additional minutes.   While looking in a mirror, press and release button in center of patch.  A small green light will flash 3-4 times .  This will be your  only indicator the monitor has been turned on.     Do not shower for the first 24 hours.  You may shower after the first 24 hours.   Press button if you feel a symptom. You will hear a small click.  Record Date, Time and Symptom in the Patient Log Book.   When you are ready to remove patch, follow instructions on last 2 pages of Patient Log Book.  Stick patch monitor onto last page of Patient Log Book.   Place Patient Log Book in [place] box.  Use locking tab on box and tape box closed securely.  The [employer] and [employer] has [employer] on it.  Please place in mailbox as soon as possible.  Your physician should have your test results approximately 7 days after the monitor has been mailed back to [facility].   Call [facility] Customer Care at [phone] if you have questions regarding your ZIO XT patch monitor.  Call them immediately if you see an orange light blinking on your monitor.   If your monitor falls off in less than 4 days contact our Monitor department at [phone].  If your monitor becomes loose or falls off after 4 days call [employer] at [phone] for suggestions on securing your monitor.

## 2020-06-13 NOTE — Addendum Note (Signed)
Addended by: Cleda Mccreedy on: 06/13/2020 03:22 PM   Modules accepted: Orders

## 2020-06-13 NOTE — Telephone Encounter (Signed)
14 day ZIO XT registered to be mailed to pt's home address.   Order was changed from ZIO AT to ZIO XT due to insurance not covering ZIO XT

## 2020-06-13 NOTE — Progress Notes (Signed)
Cardiology Office Note   Date:  06/13/2020   ID:  Wanda Hicks, DOB May 28, 1961, MRN [id number]  PCP:  Wanda ParadiseAronson, Richard, MD  Cardiologist: Dr. Royann Shiversroitoru  No chief complaint on file.    History of Present Illness: Wanda Hicks is a 59 y.o. female who presents for ongoing assessment and management of CAD with coronary CT revealing eccentric large calcified plaque at the ostium of the left main was nonobstructive (less than 50%) but also showed calcium score 198, 94th percentile. HOCM per echo 2020.  Other history includes chronic diastolic heart failure, insulin-dependent diabetes with an insulin pump, history of DKA in the past.  She called our office on 06/07/2020 with complaints of dizziness and near syncope.  She states that it only lasted about 10 seconds, she had to grab the wall in order to remain standing.  She took her blood pressure was 112/60 with a heart rate of 52.    She again experienced another episode after walking up a flight of stairs.  She reported once removing her mass and taking deep breaths and symptoms subsided.  She denied any exertional shortness of breath or chest pain in this setting.  She is here today to discuss her symptoms and for assessment.  She is a Tax adviserschool nurse and has been going up and down stairs while at work which is causing her to have some dizziness.  She thinks it may be related to wearing double masks.  When she reaches the top of the stairs she takes off her mass and has to take some deep breaths.  She states that the dizziness does get better.  Recently while at home working in her kitchen she felt some dizziness while standing at a table unloading her purse (or lunch bag) she is does not remember when she had sudden onset of dizziness where she had to hold onto the wall to keep from falling.  She denied any rapid heart rhythm or associated chest pain, pressure, or dyspnea associated with all of this.  She remains active normally.  She goes to the gym  3 days a week works with a Psychologist, educationaltrainer and other days she walks.  She denies any dizziness with working out.  She states that she does do squats which do not cause any problems.  Past Medical History:  Diagnosis Date  . Arthritis   . Bronchitis   . Chronic diastolic heart failure (HCC) 10/19/2014  . Diabetes mellitus without complication (HCC)    Type 1; pt has insulin pump  . Family history of adverse reaction to anesthesia    Patients mother has N/V  . GERD (gastroesophageal reflux disease)   . Heart murmur   . Hypertension   . Pneumonia    hx of  . Positive PPD   . Shortness of breath dyspnea    uses Albuterol inhaler PRN    Past Surgical History:  Procedure Laterality Date  . CARPAL TUNNEL RELEASE Bilateral   . CERVICAL DISCECTOMY     X 2  . CESAREAN SECTION    . COLONOSCOPY    . EYE SURGERY     as a child  . MAXIMUM ACCESS (MAS)POSTERIOR LUMBAR INTERBODY FUSION (PLIF) 1 LEVEL N/A 07/10/2016   Procedure: Lumbar four-five Maximum access posterior lumbar interbody fusion with resection of synovial cyst;  Surgeon: Maeola HarmanJoseph Stern, MD;  Location: [facility];  Service: Neurosurgery;  Laterality: N/A;  . TRIGGER FINGER RELEASE Bilateral   . ULNAR NERVE REPAIR Bilateral  Current Outpatient Medications  Medication Sig Dispense Refill  . ACCU-CHEK AVIVA PLUS test strip   0  . ACCU-CHEK FASTCLIX LANCETS MISC     . acetaminophen (TYLENOL) 500 MG tablet Take 1,000 mg by mouth 2 (two) times daily.    Marland Kitchen ADVAIR DISKUS 100-50 MCG/DOSE AEPB Inhale 1 puff into the lungs daily as needed.     Marland Kitchen albuterol (PROVENTIL HFA;VENTOLIN HFA) 108 (90 Base) MCG/ACT inhaler Inhale 1 puff into the lungs every 6 (six) hours as needed for wheezing or shortness of breath.    Marland Kitchen aspirin 81 MG EC tablet Take 81 mg by mouth daily.     Marland Kitchen etodolac (LODINE) 400 MG tablet Take 400 mg by mouth at bedtime as needed for mild pain.     . fluticasone (FLONASE) 50 MCG/ACT nasal spray Place 1 spray into both nostrils  daily.    . hydrochlorothiazide (MICROZIDE) 12.5 MG capsule Take one tablet on Tuesday, Thursday, Saturday and Sunday 20 capsule 5  . hydroxypropyl methylcellulose / hypromellose (ISOPTO TEARS / GONIOVISC) 2.5 % ophthalmic solution Place 1 drop into both eyes as needed for dry eyes.    . Insulin Human (INSULIN PUMP) SOLN Inject into the skin.    Marland Kitchen KRILL OIL PO Take 1 tablet by mouth daily.     Marland Kitchen levothyroxine (SYNTHROID) 125 MCG tablet Take 125 mcg by mouth daily before breakfast.     . losartan (COZAAR) 25 MG tablet TAKE THREE TABLETS BY MOUTH DAILY (Patient taking differently: Take 50 mg by mouth daily. ) 270 tablet 3  . magnesium oxide (MAG-OX) 400 MG tablet Take 400 mg by mouth 2 (two) times daily.     . metoprolol succinate (TOPROL-XL) 50 MG 24 hr tablet Take 1 tablet (50 mg total) by mouth daily. 90 tablet 3  . Multiple Vitamin (MULTIVITAMIN) tablet Take 1 tablet by mouth daily.    . Multiple Vitamins-Minerals (ICAPS AREDS 2) CAPS Take 1 tablet by mouth 2 (two) times daily.     Marland Kitchen NOVOLOG 100 UNIT/ML injection Inject into the skin daily.     . rosuvastatin (CRESTOR) 40 MG tablet Take 40 mg by mouth daily.     No current facility-administered medications for this visit.    Allergies:   No known allergies and Latex    Social History:  The patient  reports that she has never smoked. She has never used smokeless tobacco. She reports that she does not drink alcohol and does not use drugs.   Family History:  The patient's family history includes Alcoholism in her father; Cancer in her maternal grandfather; Hypertension in her mother; Stroke in her paternal grandfather.    ROS: All other systems are reviewed and negative. Unless otherwise mentioned in H&P    PHYSICAL EXAM: VS:  BP 132/60   Pulse (!) 54   Ht 5\' 4"  (1.626 m)   Wt 137 lb (62.1 kg)   SpO2 95%   BMI 23.52 kg/m  , BMI Body mass index is 23.52 kg/m. GEN: Well nourished, well developed, in no acute distress HEENT:  normal Neck: no JVD, carotid bruits, or masses Cardiac: RRR; harsh 2/6 holosystolic murmurs,, radiating into the abdomen rubs, or gallops,no edema  Respiratory:  Clear to auscultation bilaterally, normal work of breathing GI: soft, nontender, nondistended, + BS MS: no deformity or atrophy Skin: warm and dry, no rash Neuro:  Strength and sensation are intact Psych: euthymic mood, full affect   EKG: Sinus bradycardia heart rate 54 bpm questionable  septal infarct.  (Pressure review)  Recent Labs: 09/14/2019: Magnesium 2.1 09/15/2019: BUN 11; Creatinine, Ser 0.68; Hemoglobin 12.0; Platelets 181; Potassium 3.8; Sodium 140    Lipid Panel No results found for: CHOL, TRIG, HDL, CHOLHDL, VLDL, LDLCALC, LDLDIRECT    Wt Readings from Last 3 Encounters:  06/13/20 137 lb (62.1 kg)  01/11/20 134 lb 12.8 oz (61.1 kg)  09/22/19 140 lb (63.5 kg)      Other studies Reviewed: Echocardiogram 09-19-2019 1. Left ventricular ejection fraction, by visual estimation, is 65 to  70%. The left ventricle has normal function. Normal left ventricular size.  There is severely increased left ventricular hypertrophy.  2. Elevated mean left atrial pressure.  3. Left ventricular diastolic Doppler parameters are consistent with  impaired relaxation pattern of LV diastolic filling.  4. Global right ventricle has normal systolic function.The right  ventricular size is normal.  5. Left atrial size was moderately dilated.  6. Right atrial size was normal.  7. Moderate chordal systolic anterior motion of the mitral valve.  8. Moderate mitral annular calcification.  9. The mitral valve is normal in structure. Mild mitral valve  regurgitation. No evidence of mitral stenosis.  10. The tricuspid valve is normal in structure. Tricuspid valve  regurgitation is mild.  11. The aortic valve is tricuspid Aortic valve regurgitation was not  visualized by color flow Doppler. Mild aortic valve sclerosis without   stenosis.  12. The pulmonic valve was not well visualized. Pulmonic valve  regurgitation is not visualized by color flow Doppler.  13. Mildly elevated pulmonary artery systolic pressure.  14. The inferior vena cava is normal in size with greater than 50%  respiratory variability, suggesting right atrial pressure of 3 mmHg.  15. Hyperdynamic LV systolic function; asymmetric septal hypertrophy;  chordal SAM with elevated LVOT gradient (4.2 m/s with valsalva); mild MR;  moderate LAE; mild TR with mild pulmonary hypertension. Findings c/w HOCM.    ASSESSMENT AND PLAN:  1.  Dizziness: We checked orthostatic blood pressures and she was negative for orthostasis.  Heart rate remained normal without significant changes.  She is on HCTZ 12.5 mg 4 days a week.  I have advised her to decrease this to 3 times a week.  We will check a BMP T today.  We will also place a ZIO monitor to evaluate for significant bradycardia, arrhythmias, or rapid heart rhythm.  2.  History of HOCM: She remains on metoprolol 50 mg daily.  She states that she did take an additional 25 mg when she felt dizzy at home.  Although she denies any rapid heart rhythm, she did feel mild pressure.  Perhaps decreasing HCTZ will help her.  Would like to wean her off of it completely, but she states when she stops taking it she retains fluid significantly.  Will defer to Dr. Royann Shivers formal recommendations on follow-up.  3.  Hypothyroidism: Followed by endocrinologist.  4.  Type 1 diabetes: Diagnosed 1966 with insulin pump.  Followed by endocrinologist.  5.  History of COPD: Uncertain if dizziness is related to hyperventilation climbing stairs at school and breathing through the mask.  She does have albuterol inhalers which she uses.  If no new cardiac issues are found, or uncovered wearing monitor, may need to follow-up with pulmonology for further recommendations.   Current medicines are reviewed at length with the patient today.  I have  spent 40 minutes dedicated to the care of this patient on the date of this encounter to include pre-visit review  of records, assessment, management and diagnostic testing,with shared decision making.  Labs/ tests ordered today include: Zio monitor, BMET, Hbg A1c Bettey Mare. Liborio Nixon, ANP, AACC   06/13/2020 11:43 AM    [facility] [address] Office [phone] Fax [phone]  Notice: This dictation was prepared with Dragon dictation along with smaller phrase technology. Any transcriptional errors that result from this process are unintentional and may not be corrected upon review.

## 2020-06-14 LAB — BASIC METABOLIC PANEL
BUN/Creatinine Ratio: 23 (ref 12–28)
BUN: 16 mg/dL (ref 8–27)
CO2: 27 mmol/L (ref 20–29)
Calcium: 10 mg/dL (ref 8.7–10.3)
Chloride: 104 mmol/L (ref 96–106)
Creatinine, Ser: 0.71 mg/dL (ref 0.57–1.00)
GFR calc Af Amer: 106 mL/min/{1.73_m2} (ref >59)
GFR calc non Af Amer: 92 mL/min/{1.73_m2} (ref >59)
Glucose: 96 mg/dL (ref 65–99)
Potassium: 4.8 mmol/L (ref 3.5–5.2)
Sodium: 144 mmol/L (ref 134–144)

## 2020-06-14 LAB — HEMOGLOBIN A1C
Est. average glucose Bld gHb Est-mCnc: 126 mg/dL
Hgb A1c MFr Bld: 6 % — ABNORMAL HIGH (ref 4.8–5.6)

## 2020-06-16 ENCOUNTER — Ambulatory Visit (INDEPENDENT_AMBULATORY_CARE_PROVIDER_SITE_OTHER): Payer: BC Managed Care – PPO

## 2020-06-16 DIAGNOSIS — R42 Dizziness and giddiness: Secondary | ICD-10-CM

## 2020-07-26 ENCOUNTER — Encounter: Payer: Self-pay | Admitting: Cardiovascular Disease

## 2020-07-26 ENCOUNTER — Other Ambulatory Visit: Payer: Self-pay

## 2020-07-26 ENCOUNTER — Ambulatory Visit: Payer: BC Managed Care – PPO | Admitting: Cardiovascular Disease

## 2020-07-26 VITALS — BP 128/59 | HR 55 | Ht 64.0 in | Wt 138.4 lb

## 2020-07-26 DIAGNOSIS — E108 Type 1 diabetes mellitus with unspecified complications: Secondary | ICD-10-CM

## 2020-07-26 DIAGNOSIS — I2510 Atherosclerotic heart disease of native coronary artery without angina pectoris: Secondary | ICD-10-CM

## 2020-07-26 DIAGNOSIS — I10 Essential (primary) hypertension: Secondary | ICD-10-CM | POA: Diagnosis not present

## 2020-07-26 DIAGNOSIS — I421 Obstructive hypertrophic cardiomyopathy: Secondary | ICD-10-CM

## 2020-07-26 MED ORDER — HYDROCHLOROTHIAZIDE 12.5 MG PO CAPS: 12.5000 mg | ORAL_CAPSULE | ORAL | 11 refills | Status: DC

## 2020-07-26 NOTE — Progress Notes (Signed)
Cardiology Office Note:    Date:  07/28/2020   ID:  Wanda Hicks, DOB 02/28/1961, MRN [id number]  PCP:  Geoffry Paradise, MD  Cardiologist:  Thurmon Fair, MD  Electrophysiologist:  None   Referring MD: Geoffry Paradise, MD   Chief Complaint  Patient presents with  . Coronary Artery Disease  . Congestive Heart Failure    History of Present Illness:    Wanda Hicks is a 59 y.o. 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  Hypertrophic obstructive cardiomyopathy initially became evident during an episode of severe hypovolemia/shock related to diabetic ketoacidosis in 2020.  Coronary CT angio showed a eccentric large calcified plaque at the ostium of the left main that was not obstructive (less than 50%) but also showed a calcium score of 198 (94th percentile).  She is doing well.  She has no specific cardiovascular complaints but is always very tired.  She is taking care of her 82 year old mother with dementia, who requires "total care".  She has rare problems with palpitations and dizziness, but denies syncope.  She has not had chest pain or shortness of breath.  Recent Holter monitor showed occasional PACs and PVCs which correlated with her episodes of dizziness.  She also had rare episodes of paroxysmal atrial tachycardia which were brief and asymptomatic.  She denies lower extremity edema, claudication, focal neurological events, orthopnea or PND.  Her weight loss has stabilized.  She appears clinically euvolemic.  She tends to run a very low diastolic blood pressure.  Recently has not had problems with orthostatic dizziness too much.  Past Medical History:  Diagnosis Date  . Arthritis   . Bronchitis   . Chronic diastolic heart failure (HCC) 10/19/2014  . Diabetes mellitus without complication (HCC)    Type 1; pt has insulin pump  . Family history of  adverse reaction to anesthesia    Patients mother has N/V  . GERD (gastroesophageal reflux disease)   . Heart murmur   . Hypertension   . Pneumonia    hx of  . Positive PPD   . Shortness of breath dyspnea    uses Albuterol inhaler PRN    Past Surgical History:  Procedure Laterality Date  . CARPAL TUNNEL RELEASE Bilateral   . CERVICAL DISCECTOMY     X 2  . CESAREAN SECTION    . COLONOSCOPY    . EYE SURGERY     as a child  . MAXIMUM ACCESS (MAS)POSTERIOR LUMBAR INTERBODY FUSION (PLIF) 1 LEVEL N/A 07/10/2016   Procedure: Lumbar four-five Maximum access posterior lumbar interbody fusion with resection of synovial cyst;  Surgeon: Maeola Harman, MD;  Location: [facility];  Service: Neurosurgery;  Laterality: N/A;  . TRIGGER FINGER RELEASE Bilateral   . ULNAR NERVE REPAIR Bilateral     Current Medications: Current Meds  Medication Sig  . ACCU-CHEK AVIVA PLUS test strip   . ACCU-CHEK FASTCLIX LANCETS MISC   . acetaminophen (TYLENOL) 500 MG tablet Take 1,000 mg by mouth 2 (two) times daily.  Marland Kitchen ADVAIR DISKUS 100-50 MCG/DOSE AEPB Inhale 1 puff into the lungs daily as needed.   Marland Kitchen albuterol (PROVENTIL HFA;VENTOLIN HFA) 108 (90 Base) MCG/ACT inhaler Inhale 1 puff into the lungs every 6 (six) hours as needed for wheezing or shortness of breath.  Marland Kitchen aspirin 81 MG EC tablet Take 81 mg by mouth daily.   Marland Kitchen etodolac (LODINE) 400 MG tablet Take  400 mg by mouth at bedtime as needed for mild pain.   . fluticasone (FLONASE) 50 MCG/ACT nasal spray Place 1 spray into both nostrils daily.  . hydrochlorothiazide (MICROZIDE) 12.5 MG capsule Take 1 capsule (12.5 mg total) by mouth 2 (two) times a week.  . hydroxypropyl methylcellulose / hypromellose (ISOPTO TEARS / GONIOVISC) 2.5 % ophthalmic solution Place 1 drop into both eyes as needed for dry eyes.  . Insulin Human (INSULIN PUMP) SOLN Inject into the skin.  Marland Kitchen KRILL OIL PO Take 1 tablet by mouth daily.   Marland Kitchen levothyroxine (SYNTHROID) 125 MCG tablet Take  125 mcg by mouth daily before breakfast.   . losartan (COZAAR) 25 MG tablet TAKE THREE TABLETS BY MOUTH DAILY (Patient taking differently: Take 50 mg by mouth daily. )  . magnesium oxide (MAG-OX) 400 MG tablet Take 400 mg by mouth 2 (two) times daily.   . metoprolol succinate (TOPROL-XL) 50 MG 24 hr tablet Take 1 tablet (50 mg total) by mouth daily.  . Multiple Vitamin (MULTIVITAMIN) tablet Take 1 tablet by mouth daily.  . Multiple Vitamins-Minerals (ICAPS AREDS 2) CAPS Take 1 tablet by mouth 2 (two) times daily.   Marland Kitchen NOVOLOG 100 UNIT/ML injection Inject into the skin daily.   . rosuvastatin (CRESTOR) 40 MG tablet Take 40 mg by mouth daily.  . [DISCONTINUED] hydrochlorothiazide (MICROZIDE) 12.5 MG capsule Take one tablet on Tuesday, Thursday, Saturday and Sunday     Allergies:   No known allergies and Latex   Social History   Socioeconomic History  . Marital status: Married    Spouse name: Not on file  . Number of children: Not on file  . Years of education: Not on file  . Highest education level: Not on file  Occupational History  . Not on file  Tobacco Use  . Smoking status: Never Smoker  . Smokeless tobacco: Never Used  Substance and Sexual Activity  . Alcohol use: No  . Drug use: No  . Sexual activity: Not on file  Other Topics Concern  . Not on file  Social History Narrative  . Not on file   Social Determinants of Health   Financial Resource Strain:   . Difficulty of Paying Living Expenses:   Food Insecurity:   . Worried About Programme researcher, broadcasting/film/video in the Last Year:   . Barista in the Last Year:   Transportation Needs:   . Freight forwarder (Medical):   Marland Kitchen Lack of Transportation (Non-Medical):   Physical Activity:   . Days of Exercise per Week:   . Minutes of Exercise per Session:   Stress:   . Feeling of Stress :   Social Connections:   . Frequency of Communication with Friends and Family:   . Frequency of Social Gatherings with Friends and Family:    . Attends Religious Services:   . Active Member of Clubs or Organizations:   . Attends Banker Meetings:   Marland Kitchen Marital Status:      Family History: The patient's family history includes Alcoholism in her father; Cancer in her maternal grandfather; Hypertension in her mother; Stroke in her paternal grandfather.  ROS:   Please see the history of present illness.    All other systems are reviewed and are negative.   EKGs/Labs/Other Studies Reviewed:    The following studies were reviewed today:  Echocardiogram 09/13/2019  1. Left ventricular ejection fraction, by visual estimation, is 65 to 70%. The left ventricle has normal  function. Normal left ventricular size. There is severely increased left ventricular hypertrophy.  2. Elevated mean left atrial pressure.  3. Left ventricular diastolic Doppler parameters are consistent with impaired relaxation pattern of LV diastolic filling.  4. Global right ventricle has normal systolic function.The right ventricular size is normal.  5. Left atrial size was moderately dilated.  6. Right atrial size was normal.  7. Moderate chordal systolic anterior motion of the mitral valve.  8. Moderate mitral annular calcification.  9. The mitral valve is normal in structure. Mild mitral valve regurgitation. No evidence of mitral stenosis. 10. The tricuspid valve is normal in structure. Tricuspid valve regurgitation is mild. 11. The aortic valve is tricuspid Aortic valve regurgitation was not visualized by color flow Doppler. Mild aortic valve sclerosis without stenosis. 12. The pulmonic valve was not well visualized. Pulmonic valve regurgitation is not visualized by color flow Doppler. 13. Mildly elevated pulmonary artery systolic pressure. 14. The inferior vena cava is normal in size with greater than 50% respiratory variability, suggesting right atrial pressure of 3 mmHg. 15. Hyperdynamic LV systolic function; asymmetric septal hypertrophy;  chordal SAM with elevated LVOT gradient (4.2 m/s with valsalva); mild MR; moderate LAE; mild TR with mild pulmonary hypertension. Findings c/w HOCM.  EKG:  EKG is not ordered today.  The ekg ordered 6/30/20212cdemonstrates sinus rhythm with a QS pattern in leads V1 and V2 but is otherwise a normal tracing  Recent Labs: 09/14/2019: Magnesium 2.1 09/15/2019: Hemoglobin 12.0; Platelets 181 06/13/2020: BUN 16; Creatinine, Ser 0.71; Potassium 4.8; Sodium 144   September 19, 2019 labs at [facility] show glucose 96, BUN 15, creatinine 0.8, potassium 4.3, CO2 26, sodium 140, WBC 7.1K, hemoglobin 13.9 Recent Lipid Panel August 31, 2019 total cholesterol 151, HDL 44, LDL 93, triglycerides 70, hemoglobin A1c 6.0  Physical Exam:    VS:  BP (!) 128/59   Pulse (!) 55   Ht 5\' 4"  (1.626 m)   Wt 138 lb 6.4 oz (62.8 kg)   SpO2 100%   BMI 23.76 kg/m     Wt Readings from Last 3 Encounters:  07/26/20 138 lb 6.4 oz (62.8 kg)  06/13/20 137 lb (62.1 kg)  01/11/20 134 lb 12.8 oz (61.1 kg)       General: Alert, oriented x3, no distress Head: no evidence of trauma, PERRL, EOMI, no exophtalmos or lid lag, no myxedema, no xanthelasma; normal ears, nose and oropharynx Neck: normal jugular venous pulsations and no hepatojugular reflux; brisk carotid pulses without delay and no carotid bruits Chest: clear to auscultation, no signs of consolidation by percussion or palpation, normal fremitus, symmetrical and full respiratory excursions Cardiovascular: normal position and quality of the apical impulse, regular rhythm, normal first and second heart sounds, 1/6 aortic ejection murmur increases to 2/6 with a Valsalva maneuver and almost disappears with handgrip no diastolic murmurs, rubs or gallops Abdomen: no tenderness or distention, no masses by palpation, no abnormal pulsatility or arterial bruits, normal bowel sounds, no hepatosplenomegaly Extremities: no clubbing, cyanosis or edema; 2+ radial, ulnar and  brachial pulses bilaterally; 2+ right femoral, posterior tibial and dorsalis pedis pulses; 2+ left femoral, posterior tibial and dorsalis pedis pulses; no subclavian or femoral bruits Neurological: grossly nonfocal Psych: Normal mood and affect'  ASSESSMENT:    1. Coronary artery disease involving native coronary artery of native heart without angina pectoris   2. HOCM (hypertrophic obstructive cardiomyopathy) (HCC)   3. Type 1 diabetes mellitus with complications (HCC)   4. Essential hypertension  PLAN:    In order of problems listed above: 1. CAD: Asymptomatic.  Moderate eccentric left main coronary artery stenosis. 2. HOCM: Late onset, asymptomatic until she had DKA.  Continue beta-blockers and try to minimize use of diuretics.  Avoid potent vasodilators.  Edema has not recurred as we have cut back the hydrochlorothiazide and will try to decrease it further.   3. DM type 1: She is extremely insulin sensitive.  Well-controlled on insulin pump with a recent hemoglobin A1c of 6%. 4. HTN: Beta-blocker should be the mainstay of treatment.  Okay to use ARB since they provide renal protection.  Try to minimize use of diuretics.  We will decrease the hydrochlorothiazide to only twice weekly.  She is still concerned that this will eventually cause problems with edema, but no edema is present today.  Medication Adjustments/Labs and Tests Ordered: Current medicines are reviewed at length with the patient today.  Concerns regarding medicines are outlined above.  No orders of the defined types were placed in this encounter.  Meds ordered this encounter  Medications  . hydrochlorothiazide (MICROZIDE) 12.5 MG capsule    Sig: Take 1 capsule (12.5 mg total) by mouth 2 (two) times a week.    Dispense:  10 capsule    Refill:  11    Patient Instructions  Medication Instructions:  DECREASE the Hydrochlorothiazide to 2 times weekly.  *If you need a refill on your cardiac medications before your  next appointment, please call your pharmacy*   Lab Work: None ordered If you have labs (blood work) drawn today and your tests are completely normal, you will receive your results only by: Marland Kitchen. MyChart Message (if you have MyChart) OR . A paper copy in the mail If you have any lab test that is abnormal or we need to change your treatment, we will call you to review the results.   Testing/Procedures: None ordered   Follow-Up: At [facility], you and your health needs are our priority.  As part of our continuing mission to provide you with exceptional heart care, we have created designated Provider Care Teams.  These Care Teams include your primary Cardiologist (physician) and Advanced Practice Providers (APPs -  Physician Assistants and Nurse Practitioners) who all work together to provide you with the care you need, when you need it.  We recommend signing up for the patient portal called "MyChart".  Sign up information is provided on this After Visit Summary.  MyChart is used to connect with patients for Virtual Visits (Telemedicine).  Patients are able to view lab/test results, encounter notes, upcoming appointments, etc.  Non-urgent messages can be sent to your provider as well.   To learn more about what you can do with MyChart, go to [email or web address].    Your next appointment:   12 month(s)  The format for your next appointment:   In Person  Provider:   You may see Thurmon FairMihai Shawndra Clute, MD or one of the following Advanced Practice Providers on your designated Care Team:    Azalee CourseHao Meng, PA-C  Micah FlesherAngela Duke, [place] JerseyPA-C or   Judy PimpleKrista Kroeger, PA-C       Signed, Thurmon FairMihai Ritik Stavola, MD  07/28/2020 6:39 PM    [facility]

## 2020-07-26 NOTE — Patient Instructions (Signed)
Medication Instructions:  DECREASE the Hydrochlorothiazide to 2 times weekly.  *If you need a refill on your cardiac medications before your next appointment, please call your pharmacy*   Lab Work: None ordered If you have labs (blood work) drawn today and your tests are completely normal, you will receive your results only by: Marland Kitchen MyChart Message (if you have MyChart) OR . A paper copy in the mail If you have any lab test that is abnormal or we need to change your treatment, we will call you to review the results.   Testing/Procedures: None ordered   Follow-Up: At [facility], you and your health needs are our priority.  As part of our continuing mission to provide you with exceptional heart care, we have created designated Provider Care Teams.  These Care Teams include your primary Cardiologist (physician) and Advanced Practice Providers (APPs -  Physician Assistants and Nurse Practitioners) who all work together to provide you with the care you need, when you need it.  We recommend signing up for the patient portal called "MyChart".  Sign up information is provided on this After Visit Summary.  MyChart is used to connect with patients for Virtual Visits (Telemedicine).  Patients are able to view lab/test results, encounter notes, upcoming appointments, etc.  Non-urgent messages can be sent to your provider as well.   To learn more about what you can do with MyChart, go to [email or web address].    Your next appointment:   12 month(s)  The format for your next appointment:   In Person  Provider:   You may see Thurmon Fair, MD or one of the following Advanced Practice Providers on your designated Care Team:    Azalee Course, PA-C  Micah Flesher, PA-C or   Judy Pimple, [place]

## 2020-07-28 ENCOUNTER — Encounter: Payer: Self-pay | Admitting: Cardiovascular Disease

## 2020-08-12 ENCOUNTER — Other Ambulatory Visit: Payer: Self-pay | Admitting: Cardiovascular Disease

## 2020-08-31 ENCOUNTER — Other Ambulatory Visit: Payer: BC Managed Care – PPO

## 2020-08-31 ENCOUNTER — Other Ambulatory Visit: Payer: Self-pay

## 2020-08-31 DIAGNOSIS — Z20822 Contact with and (suspected) exposure to covid-19: Secondary | ICD-10-CM

## 2020-09-03 LAB — NOVEL CORONAVIRUS, NAA: SARS-CoV-2, NAA: NOT DETECTED

## 2020-09-06 ENCOUNTER — Other Ambulatory Visit: Payer: Self-pay | Admitting: Cardiovascular Disease

## 2020-11-30 ENCOUNTER — Other Ambulatory Visit: Payer: BC Managed Care – PPO

## 2020-11-30 DIAGNOSIS — Z20822 Contact with and (suspected) exposure to covid-19: Secondary | ICD-10-CM

## 2020-12-01 LAB — SARS-COV-2, NAA 2 DAY TAT

## 2020-12-01 LAB — NOVEL CORONAVIRUS, NAA: SARS-CoV-2, NAA: NOT DETECTED

## 2020-12-26 ENCOUNTER — Other Ambulatory Visit: Payer: BC Managed Care – PPO

## 2020-12-28 ENCOUNTER — Other Ambulatory Visit: Payer: Self-pay

## 2020-12-28 DIAGNOSIS — Z20822 Contact with and (suspected) exposure to covid-19: Secondary | ICD-10-CM

## 2020-12-29 LAB — SARS-COV-2, NAA 2 DAY TAT

## 2020-12-29 LAB — NOVEL CORONAVIRUS, NAA: SARS-CoV-2, NAA: NOT DETECTED

## 2021-01-09 ENCOUNTER — Other Ambulatory Visit: Payer: Self-pay

## 2021-01-09 DIAGNOSIS — Z20822 Contact with and (suspected) exposure to covid-19: Secondary | ICD-10-CM

## 2021-01-10 LAB — NOVEL CORONAVIRUS, NAA: SARS-CoV-2, NAA: NOT DETECTED

## 2021-01-10 LAB — SARS-COV-2, NAA 2 DAY TAT

## 2021-01-14 ENCOUNTER — Telehealth: Payer: Self-pay | Admitting: Cardiovascular Disease

## 2021-01-14 NOTE — Telephone Encounter (Signed)
Patient reports she has shortness of breath with exertion.   Patient has some lower extremity swelling at the end of the day. She watches her salt but states she does better some days than others. She does not elevate her legs.   She reports she has been having dizzy spells. She states on Tuesday or Wednesday of last week she was standing talking to someone and had to grab the wall because she almost passed out.   She has not been checking her blood pressure but reports she will work on taking it. 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   She has an appointment on 02/04/2021 with [place].  Will route to MD.

## 2021-01-14 NOTE — Telephone Encounter (Signed)
Spoke with patient. She reports her blood sugar has not been running high consistently and that she is drinking a lot of tea during the day. She is out at [employer] and checked her blood pressure using their machine. Blood pressure was 160/58, HR 61.

## 2021-01-14 NOTE — Telephone Encounter (Signed)
     Pt c/o Shortness Of Breath: STAT if SOB developed within the last 24 hours or pt is noticeably SOB on the phone  1. Are you currently SOB (can you hear that pt is SOB on the phone)? No  2. How long have you been experiencing SOB? Since last week  3. Are you SOB when sitting or when up moving around? Moving around  4. Are you currently experiencing any other symptoms? Pt said since last week she feeling SOB but last couple of days she felt like it's getting worst. One time last week she almost passed out. She made an appt with [facility] on 02/21 and she requested to send message to Dr. Renaye Rakers nurse

## 2021-01-14 NOTE — Telephone Encounter (Signed)
Has her glucose been running high recently? Could make her dehydrated. Is she taking metoprolol? Need to know BP please

## 2021-01-15 NOTE — Telephone Encounter (Signed)
Need an office appt ...any place to add on?

## 2021-01-15 NOTE — Telephone Encounter (Signed)
Appointment made for 2/2 with Dr. Royann Shivers.

## 2021-01-16 ENCOUNTER — Ambulatory Visit: Payer: BC Managed Care – PPO | Admitting: Cardiovascular Disease

## 2021-01-16 ENCOUNTER — Other Ambulatory Visit: Payer: Self-pay

## 2021-01-16 ENCOUNTER — Encounter: Payer: Self-pay | Admitting: Cardiovascular Disease

## 2021-01-16 VITALS — BP 120/60 | HR 61 | Ht 64.0 in | Wt 148.0 lb

## 2021-01-16 DIAGNOSIS — I5032 Chronic diastolic (congestive) heart failure: Secondary | ICD-10-CM | POA: Diagnosis not present

## 2021-01-16 DIAGNOSIS — I421 Obstructive hypertrophic cardiomyopathy: Secondary | ICD-10-CM | POA: Diagnosis not present

## 2021-01-16 DIAGNOSIS — E108 Type 1 diabetes mellitus with unspecified complications: Secondary | ICD-10-CM | POA: Diagnosis not present

## 2021-01-16 DIAGNOSIS — I2510 Atherosclerotic heart disease of native coronary artery without angina pectoris: Secondary | ICD-10-CM

## 2021-01-16 DIAGNOSIS — I10 Essential (primary) hypertension: Secondary | ICD-10-CM

## 2021-01-16 MED ORDER — LOSARTAN POTASSIUM 50 MG PO TABS: 50.0000 mg | ORAL_TABLET | Freq: Every day | ORAL | 3 refills | Status: DC

## 2021-01-16 NOTE — Progress Notes (Signed)
Cardiology Office Note:    Date:  01/16/2021   ID:  Wanda Hicks, DOB November 10, 1961, MRN [id number]  PCP:  Geoffry Paradise, MD  Cardiologist:  Thurmon Fair, MD  Electrophysiologist:  None   Referring MD: Geoffry Paradise, MD   Chief Complaint  Patient presents with  . Shortness of Breath  . Dizziness         History of Present Illness:    Wanda Hicks is a 60 y.o. 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  Recently she has noticed reduced exercise tolerance.  She sometimes feels dizzy and her knees "buckled".  When climbing a flight of stairs she has to stop and catch her breath at the intermediate landing and at the top of the flight.  She has some swelling in her ankles towards the end of the day and takes hydrochlorothiazide twice weekly.  She wears compression stockings.  Recently not with a lot of problems with orthostatic dizziness.  She denies orthopnea or PND.  She has some "pinching" brief twinges in the upper left chest at rest.  She does not have any exertional chest discomfort.  She complains of fatigue, but is still working full-time and taking care of her elderly mother with dementia, which causes a lot of emotional difficulty and loss of sleep.  Hypertrophic obstructive cardiomyopathy initially became evident during an episode of severe hypovolemia/shock related to diabetic ketoacidosis in 2020.  Coronary CT angio showed a eccentric large calcified plaque at the ostium of the left main that was not obstructive (less than 50%) but also showed a calcium score of 198 (94th percentile).  A previous Holter monitor showed occasional PACs and PVCs which seem to correlate with her episodes of dizziness as well as rare episodes of asymptomatic paroxysmal atrial tachycardia.   Past Medical History:  Diagnosis Date  . Arthritis   . Bronchitis   . Chronic  diastolic heart failure (HCC) 10/19/2014  . Diabetes mellitus without complication (HCC)    Type 1; pt has insulin pump  . Family history of adverse reaction to anesthesia    Patients mother has N/V  . GERD (gastroesophageal reflux disease)   . Heart murmur   . Hypertension   . Pneumonia    hx of  . Positive PPD   . Shortness of breath dyspnea    uses Albuterol inhaler PRN    Past Surgical History:  Procedure Laterality Date  . CARPAL TUNNEL RELEASE Bilateral   . CERVICAL DISCECTOMY     X 2  . CESAREAN SECTION    . COLONOSCOPY    . EYE SURGERY     as a child  . MAXIMUM ACCESS (MAS)POSTERIOR LUMBAR INTERBODY FUSION (PLIF) 1 LEVEL N/A 07/10/2016   Procedure: Lumbar four-five Maximum access posterior lumbar interbody fusion with resection of synovial cyst;  Surgeon: Maeola Harman, MD;  Location: [facility];  Service: Neurosurgery;  Laterality: N/A;  . TRIGGER FINGER RELEASE Bilateral   . ULNAR NERVE REPAIR Bilateral     Current Medications: Current Meds  Medication Sig  . ACCU-CHEK AVIVA PLUS test strip   . ACCU-CHEK FASTCLIX LANCETS MISC   . acetaminophen (TYLENOL) 500 MG tablet Take 1,000 mg by mouth 2 (two) times daily.  Marland Kitchen ADVAIR DISKUS 100-50 MCG/DOSE AEPB Inhale 1 puff into the lungs daily as needed.   Marland Kitchen albuterol (PROVENTIL HFA;VENTOLIN HFA) 108 (90 Base) MCG/ACT inhaler Inhale 1  puff into the lungs every 6 (six) hours as needed for wheezing or shortness of breath.  Marland Kitchen aspirin 81 MG EC tablet Take 81 mg by mouth daily.   Marland Kitchen etodolac (LODINE) 400 MG tablet Take 400 mg by mouth at bedtime as needed for mild pain.   . fluticasone (FLONASE) 50 MCG/ACT nasal spray Place 1 spray into both nostrils daily.  . hydrochlorothiazide (MICROZIDE) 12.5 MG capsule Take 1 capsule (12.5 mg total) by mouth 2 (two) times a week.  . hydroxypropyl methylcellulose / hypromellose (ISOPTO TEARS / GONIOVISC) 2.5 % ophthalmic solution Place 1 drop into both eyes as needed for dry eyes.  . Insulin  Human (INSULIN PUMP) SOLN Inject into the skin.  Marland Kitchen KRILL OIL PO Take 1 tablet by mouth daily.   Marland Kitchen levothyroxine (SYNTHROID) 125 MCG tablet Take 125 mcg by mouth daily before breakfast.   . magnesium oxide (MAG-OX) 400 MG tablet Take 400 mg by mouth 2 (two) times daily.   . metoprolol succinate (TOPROL-XL) 50 MG 24 hr tablet TAKE ONE TABLET BY MOUTH DAILY  . Multiple Vitamin (MULTIVITAMIN) tablet Take 1 tablet by mouth daily.  Marland Kitchen NOVOLOG 100 UNIT/ML injection Inject into the skin daily.   . rosuvastatin (CRESTOR) 40 MG tablet Take 40 mg by mouth daily.  . [DISCONTINUED] losartan (COZAAR) 25 MG tablet TAKE THREE TABLETS BY MOUTH DAILY     Allergies:   No known allergies and Latex   Social History   Socioeconomic History  . Marital status: Married    Spouse name: Not on file  . Number of children: Not on file  . Years of education: Not on file  . Highest education level: Not on file  Occupational History  . Not on file  Tobacco Use  . Smoking status: Never Smoker  . Smokeless tobacco: Never Used  Substance and Sexual Activity  . Alcohol use: No  . Drug use: No  . Sexual activity: Not on file  Other Topics Concern  . Not on file  Social History Narrative  . Not on file   Social Determinants of Health   Financial Resource Strain: Not on file  Food Insecurity: Not on file  Transportation Needs: Not on file  Physical Activity: Not on file  Stress: Not on file  Social Connections: Not on file     Family History: The patient's family history includes Alcoholism in her father; Cancer in her maternal grandfather; Hypertension in her mother; Stroke in her paternal grandfather.  ROS:   Please see the history of present illness.    All other systems are reviewed and are negative.   EKGs/Labs/Other Studies Reviewed:    The following studies were reviewed today:  Echocardiogram 09/13/2019  1. Left ventricular ejection fraction, by visual estimation, is 65 to 70%. The left  ventricle has normal function. Normal left ventricular size. There is severely increased left ventricular hypertrophy.  2. Elevated mean left atrial pressure.  3. Left ventricular diastolic Doppler parameters are consistent with impaired relaxation pattern of LV diastolic filling.  4. Global right ventricle has normal systolic function.The right ventricular size is normal.  5. Left atrial size was moderately dilated.  6. Right atrial size was normal.  7. Moderate chordal systolic anterior motion of the mitral valve.  8. Moderate mitral annular calcification.  9. The mitral valve is normal in structure. Mild mitral valve regurgitation. No evidence of mitral stenosis. 10. The tricuspid valve is normal in structure. Tricuspid valve regurgitation is mild. 11. The  aortic valve is tricuspid Aortic valve regurgitation was not visualized by color flow Doppler. Mild aortic valve sclerosis without stenosis. 12. The pulmonic valve was not well visualized. Pulmonic valve regurgitation is not visualized by color flow Doppler. 13. Mildly elevated pulmonary artery systolic pressure. 14. The inferior vena cava is normal in size with greater than 50% respiratory variability, suggesting right atrial pressure of 3 mmHg. 15. Hyperdynamic LV systolic function; asymmetric septal hypertrophy; chordal SAM with elevated LVOT gradient (4.2 m/s with valsalva); mild MR; moderate LAE; mild TR with mild pulmonary hypertension. Findings c/w HOCM.  EKG:  EKG is ordered today and shows normal sinus rhythm, borderline voltage criteria for LVH with subtle ST segment depression and T wave inversion in the lateral leads, old QS pattern in leads V1-V2 unchanged, QTC 444 ms  Recent Labs: 06/13/2020: BUN 16; Creatinine, Ser 0.71; Potassium 4.8; Sodium 144   September 19, 2019 labs at [facility] medical show glucose 96, BUN 15, creatinine 0.8, potassium 4.3, CO2 26, sodium 140, WBC 7.1K, hemoglobin 13.9  Recent Lipid Panel August 31, 2019 total cholesterol 151, HDL 44, LDL 93, triglycerides 70, hemoglobin A1c 6.0% Hemoglobin A1c on 06/13/2020 was 6.0% Had more recent labs at [facility] on November 1 but I don't have those available for review.  They were "okay".  Physical Exam:    VS:  BP 120/60 (BP Location: Right Arm, Patient Position: Sitting)   Pulse 61   Ht 5\' 4"  (1.626 m)   Wt 148 lb (67.1 kg)   SpO2 99%   BMI 25.40 kg/m     Wt Readings from Last 3 Encounters:  01/16/21 148 lb (67.1 kg)  07/26/20 138 lb 6.4 oz (62.8 kg)  06/13/20 137 lb (62.1 kg)      General: Alert, oriented x3, no distress, appears well Head: no evidence of trauma, PERRL, EOMI, no exophtalmos or lid lag, no myxedema, no xanthelasma; normal ears, nose and oropharynx Neck: normal jugular venous pulsations and no hepatojugular reflux; brisk carotid pulses without delay and no carotid bruits Chest: clear to auscultation, no signs of consolidation by percussion or palpation, normal fremitus, symmetrical and full respiratory excursions Cardiovascular: normal position and quality of the apical impulse, regular rhythm, normal first and second heart sounds, 3/6 mid-late systolic murmur heard at the aortic focus, at the left lower sternal border and at the apex; the murmur does not disappear with handgrip and intensifies with a Valsalva maneuver.  There are no diastolic murmurs, rubs or gallops Abdomen: no tenderness or distention, no masses by palpation, no abnormal pulsatility or arterial bruits, normal bowel sounds, no hepatosplenomegaly Extremities: no clubbing, cyanosis or edema; 2+ radial, ulnar and brachial pulses bilaterally; 2+ right femoral, posterior tibial and dorsalis pedis pulses; 2+ left femoral, posterior tibial and dorsalis pedis pulses; no subclavian or femoral bruits Neurological: grossly nonfocal Psych: Normal mood and affect   ASSESSMENT:    1. HOCM (hypertrophic obstructive cardiomyopathy) (HCC)   2. Chronic diastolic  heart failure (HCC)   3. Coronary artery disease involving native coronary artery of native heart without angina pectoris   4. Type 1 diabetes mellitus with complications (HCC)   5. Essential hypertension    PLAN:    In order of problems listed above: 1. HOCM: I suspect this may be the cause of her worsening complaints of exertional dizziness and dyspnea.  The murmur is much louder than it was at her last appointment.  She has complaints of fatigue and heart rate is typically in  the 50-60 range, so does not allow higher doses of metoprolol.  Her hypertrophic cardiomyopathy was of late onset, asymptomatic until she had DKA.  Continue beta-blockers and try to minimize use of diuretics.  Losartan for hypertension and renal function protection with diabetes, will reduce the dose to 50 mg daily.  She has very mild edema and I have discouraged the use of hydrochlorothiazide, but she feels it still necessary twice a week.  We'll repeat her echocardiogram.  Waiting for possible FDA approval of mavacamten.  Discussed possible need for septal myectomy. 2. CHF: Diastolic heart failure due to LV outflow tract obstruction and relaxation impairment.  No clinical signs of hypervolemia on exam today.  Avoid excessive diuresis since this will worsen obstruction. 3. CAD: Her "pinching" like left chest pain does not sound like angina.  Moderate eccentric left main coronary artery stenosis seen on CT angiography.  Target LDL less than 70. 4. DM type 1: She is extremely insulin sensitive.  Only needs approximately 30 units a day.  Very well controlled on insulin pump with a recent hemoglobin A1c of 6%. 5. HTN: Beta-blocker should be the mainstay of treatment.  However, heart rate appears to limit additional beta-blocker use.  At home sometimes her heart rate is as low as 52 bpm.  Medication Adjustments/Labs and Tests Ordered: Current medicines are reviewed at length with the patient today.  Concerns regarding medicines  are outlined above.  Orders Placed This Encounter  Procedures  . EKG 12-Lead  . ECHOCARDIOGRAM COMPLETE   Meds ordered this encounter  Medications  . losartan (COZAAR) 50 MG tablet    Sig: Take 1 tablet (50 mg total) by mouth daily.    Dispense:  90 tablet    Refill:  3    Patient Instructions  Medication Instructions:  DECREASE the Losartan to 50 mg once daily  *If you need a refill on your cardiac medications before your next appointment, please call your pharmacy*   Lab Work: None ordered If you have labs (blood work) drawn today and your tests are completely normal, you will receive your results only by: Marland Kitchen. MyChart Message (if you have MyChart) OR . A paper copy in the mail If you have any lab test that is abnormal or we need to change your treatment, we will call you to review the results.   Testing/Procedures: Your physician has requested that you have an echocardiogram. Echocardiography is a painless test that uses sound waves to create images of your heart. It provides your doctor with information about the size and shape of your heart and how well your heart's chambers and valves are working. You may receive an ultrasound enhancing agent through an IV if needed to better visualize your heart during the echo.This procedure takes approximately one hour. There are no restrictions for this procedure. This will take place at the [address], [address].    Follow-Up: At [facility], you and your health needs are our priority.  As part of our continuing mission to provide you with exceptional heart care, we have created designated Provider Care Teams.  These Care Teams include your primary Cardiologist (physician) and Advanced Practice Providers (APPs -  Physician Assistants and Nurse Practitioners) who all work together to provide you with the care you need, when you need it.  We recommend signing up for the patient portal called "MyChart".  Sign up information is  provided on this After Visit Summary.  MyChart is used to connect with  patients for Virtual Visits (Telemedicine).  Patients are able to view lab/test results, encounter notes, upcoming appointments, etc.  Non-urgent messages can be sent to your provider as well.   To learn more about what you can do with MyChart, go to [email or web address].    Your next appointment:   4 month(s)  The format for your next appointment:   In Person  Provider:   You may see Thurmon Fair, MD or one of the following Advanced Practice Providers on your designated Care Team:    Azalee Course, PA-C  Micah Flesher, [place] or   Judy Pimple, PA-C     Signed, Thurmon Fair, MD  01/16/2021 10:55 AM    [facility]

## 2021-01-16 NOTE — Patient Instructions (Signed)
Medication Instructions:  DECREASE the Losartan to 50 mg once daily  *If you need a refill on your cardiac medications before your next appointment, please call your pharmacy*   Lab Work: None ordered If you have labs (blood work) drawn today and your tests are completely normal, you will receive your results only by: Marland Kitchen MyChart Message (if you have MyChart) OR . A paper copy in the mail If you have any lab test that is abnormal or we need to change your treatment, we will call you to review the results.   Testing/Procedures: Your physician has requested that you have an echocardiogram. Echocardiography is a painless test that uses sound waves to create images of your heart. It provides your doctor with information about the size and shape of your heart and how well your heart's chambers and valves are working. You may receive an ultrasound enhancing agent through an IV if needed to better visualize your heart during the echo.This procedure takes approximately one hour. There are no restrictions for this procedure. This will take place at the [address], [address].    Follow-Up: At [facility], you and your health needs are our priority.  As part of our continuing mission to provide you with exceptional heart care, we have created designated Provider Care Teams.  These Care Teams include your primary Cardiologist (physician) and Advanced Practice Providers (APPs -  Physician Assistants and Nurse Practitioners) who all work together to provide you with the care you need, when you need it.  We recommend signing up for the patient portal called "MyChart".  Sign up information is provided on this After Visit Summary.  MyChart is used to connect with patients for Virtual Visits (Telemedicine).  Patients are able to view lab/test results, encounter notes, upcoming appointments, etc.  Non-urgent messages can be sent to your provider as well.   To learn more about what you can do with MyChart,  go to [email or web address].    Your next appointment:   4 month(s)  The format for your next appointment:   In Person  Provider:   You may see Thurmon Fair, MD or one of the following Advanced Practice Providers on your designated Care Team:    Azalee Course, PA-C  Micah Flesher, PA-C or   Judy Pimple, [place]

## 2021-02-04 ENCOUNTER — Ambulatory Visit (HOSPITAL_COMMUNITY): Payer: BC Managed Care – PPO | Attending: Cardiovascular Disease

## 2021-02-04 ENCOUNTER — Other Ambulatory Visit: Payer: Self-pay

## 2021-02-04 ENCOUNTER — Ambulatory Visit: Payer: Self-pay | Admitting: Medical

## 2021-02-04 DIAGNOSIS — I421 Obstructive hypertrophic cardiomyopathy: Secondary | ICD-10-CM | POA: Diagnosis present

## 2021-02-04 LAB — ECHOCARDIOGRAM COMPLETE
AV Mean grad: 10 mmHg
AV Peak grad: 19.4 mmHg
Ao pk vel: 2.2 m/s
Area-P 1/2: 2.95 cm2
S' Lateral: 1.6 cm

## 2021-02-14 ENCOUNTER — Other Ambulatory Visit: Payer: Self-pay | Admitting: *Deleted

## 2021-02-14 MED ORDER — METOPROLOL SUCCINATE ER 50 MG PO TB24: 75.0000 mg | ORAL_TABLET | Freq: Every day | ORAL | 3 refills | Status: DC

## 2021-05-14 ENCOUNTER — Telehealth: Payer: Self-pay | Admitting: Cardiovascular Disease

## 2021-05-14 MED ORDER — METOPROLOL SUCCINATE ER 50 MG PO TB24: 75.0000 mg | ORAL_TABLET | Freq: Every day | ORAL | 3 refills | Status: DC

## 2021-05-14 NOTE — Telephone Encounter (Signed)
*  STAT* If patient is at the pharmacy, call can be transferred to refill team.   1. Which medications need to be refilled? (please list name of each medication and dose if known)   metoprolol succinate (TOPROL-XL) 50 MG 24 hr tablet     2. Which pharmacy/location (including street and city if local pharmacy) is medication to be sent to? [facility] [place] [facility] [address], [place] - [address]  3. Do they need a 30 day or 90 day supply? 30 day supply   PT is all out of the medication

## 2021-05-22 ENCOUNTER — Ambulatory Visit: Payer: BC Managed Care – PPO | Admitting: Cardiovascular Disease

## 2021-05-22 ENCOUNTER — Other Ambulatory Visit: Payer: Self-pay

## 2021-05-22 ENCOUNTER — Encounter: Payer: Self-pay | Admitting: Cardiovascular Disease

## 2021-05-22 VITALS — BP 124/68 | HR 57 | Ht 64.0 in | Wt 148.0 lb

## 2021-05-22 DIAGNOSIS — I5032 Chronic diastolic (congestive) heart failure: Secondary | ICD-10-CM

## 2021-05-22 DIAGNOSIS — I10 Essential (primary) hypertension: Secondary | ICD-10-CM

## 2021-05-22 DIAGNOSIS — I421 Obstructive hypertrophic cardiomyopathy: Secondary | ICD-10-CM

## 2021-05-22 DIAGNOSIS — I2510 Atherosclerotic heart disease of native coronary artery without angina pectoris: Secondary | ICD-10-CM

## 2021-05-22 DIAGNOSIS — E108 Type 1 diabetes mellitus with unspecified complications: Secondary | ICD-10-CM | POA: Diagnosis not present

## 2021-05-22 NOTE — Progress Notes (Signed)
Cardiology Office Note:    Date:  05/22/2021   ID:  Wanda Hicks, DOB 12-02-1961, MRN [id number]  PCP:  Geoffry Paradise, MD  Cardiologist:  Thurmon Fair, MD  Electrophysiologist:  None   Referring MD: Geoffry Paradise, MD   Chief Complaint  Patient presents with  . Cardiomyopathy    HOCM    History of Present Illness:    Wanda Hicks is a 60 y.o. female with a hx of insulin-dependent diabetes mellitus since age 9, managed with an insulin pump, hypertrophic obstructive cardiomyopathy and nonobstructive CAD.  She continues to be symptomatic with activity.  She has not had major events and has not had full-blown syncope.  However if she rushes during a really busy day at work she will develop shortness of breath, palpitations and dizziness if she moves too fast or climbs 10 steps at work.  The symptoms resolve fairly promptly when she stops to rest.  Most days are good days.  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  She has no complaints at rest.  She denies orthopnea or PND and recently has not had orthostatic dizziness. She does not have any exertional chest discomfort.  She is no longer working full-time, but is sill taking care of her elderly mother with dementia, which causes a lot of emotional difficulty and loss of sleep.  The most recent echo in February showed a resting gradient of 58 mm Hg, >90 mm Hg after the Valsalva maneuver..  Hypertrophic obstructive cardiomyopathy initially became evident during an episode of severe hypovolemia/shock related to diabetic ketoacidosis in 2020.  Coronary CT angio showed a eccentric large calcified plaque at the ostium of the left main that was not obstructive (less than 50%) but also showed a calcium score of 198 (94th percentile).  A previous Holter monitor showed occasional PACs and PVCs which seem to correlate with her episodes of  dizziness as well as rare episodes of asymptomatic paroxysmal atrial tachycardia.   Past Medical History:  Diagnosis Date  . Arthritis   . Bronchitis   . Chronic diastolic heart failure (HCC) 10/19/2014  . Diabetes mellitus without complication (HCC)    Type 1; pt has insulin pump  . Family history of adverse reaction to anesthesia    Patients mother has N/V  . GERD (gastroesophageal reflux disease)   . Heart murmur   . Hypertension   . Pneumonia    hx of  . Positive PPD   . Shortness of breath dyspnea    uses Albuterol inhaler PRN    Past Surgical History:  Procedure Laterality Date  . CARPAL TUNNEL RELEASE Bilateral   . CERVICAL DISCECTOMY     X 2  . CESAREAN SECTION    . COLONOSCOPY    . EYE SURGERY     as a child  . MAXIMUM ACCESS (MAS)POSTERIOR LUMBAR INTERBODY FUSION (PLIF) 1 LEVEL N/A 07/10/2016   Procedure: Lumbar four-five Maximum access posterior lumbar interbody fusion with resection of synovial cyst;  Surgeon: Maeola Harman, MD;  Location: [facility];  Service: Neurosurgery;  Laterality: N/A;  . TRIGGER FINGER RELEASE Bilateral   . ULNAR NERVE REPAIR Bilateral     Current Medications: Current Meds  Medication Sig  . ACCU-CHEK AVIVA PLUS test strip   . acetaminophen (TYLENOL) 500 MG tablet Take 1,000 mg by mouth 2 (two) times daily.  Marland Kitchen ADVAIR DISKUS 100-50 MCG/DOSE AEPB  Inhale 1 puff into the lungs daily as needed.   Marland Kitchen albuterol (PROVENTIL HFA;VENTOLIN HFA) 108 (90 Base) MCG/ACT inhaler Inhale 1 puff into the lungs every 6 (six) hours as needed for wheezing or shortness of breath.  Marland Kitchen aspirin 81 MG EC tablet Take 81 mg by mouth daily.   . Continuous Blood Gluc Sensor (FREESTYLE LIBRE 2 SENSOR) MISC Change every 14 days to monitor blood glucose continously  . etodolac (LODINE) 400 MG tablet Take 400 mg by mouth at bedtime as needed for mild pain.   . fluticasone (FLONASE) 50 MCG/ACT nasal spray Place 1 spray into both nostrils daily.  . hydroxypropyl  methylcellulose / hypromellose (ISOPTO TEARS / GONIOVISC) 2.5 % ophthalmic solution Place 1 drop into both eyes as needed for dry eyes.  . insulin aspart (NOVOLOG) 100 UNIT/ML injection USE AS DIRECTED IN INSULIN PUMP UP TO 85 UNITS. Dx: E10.69  . Insulin Human (INSULIN PUMP) SOLN Inject into the skin.  . INSULIN SYRINGE 1CC/29G 29G X 1/2" 1 ML MISC Use as directed to administer insulin. DX: 10.9  . KRILL OIL PO Take 1 tablet by mouth daily.   Marland Kitchen levothyroxine (SYNTHROID) 125 MCG tablet Take 125 mcg by mouth daily before breakfast.   . loratadine (CLARITIN) 10 MG tablet one po every day  . losartan (COZAAR) 50 MG tablet Take 1 tablet (50 mg total) by mouth daily.  . magnesium oxide (MAG-OX) 400 MG tablet Take 400 mg by mouth 2 (two) times daily.   . metoprolol succinate (TOPROL-XL) 50 MG 24 hr tablet Take 1.5 tablets (75 mg total) by mouth daily. Take with or immediately following a meal.  . Multiple Vitamin (MULTIVITAMIN) tablet Take 1 tablet by mouth daily.  . Multiple Vitamins-Minerals (PRESERVISION AREDS 2) CAPS   . OneTouch Delica Lancets 33G MISC use as directed to test blood sugar 4-6 times daily, DX: E10.9  . rosuvastatin (CRESTOR) 40 MG tablet Take 40 mg by mouth daily.     Allergies:   Empagliflozin and Latex   Social History   Socioeconomic History  . Marital status: Married    Spouse name: Not on file  . Number of children: Not on file  . Years of education: Not on file  . Highest education level: Not on file  Occupational History  . Not on file  Tobacco Use  . Smoking status: Never Smoker  . Smokeless tobacco: Never Used  Substance and Sexual Activity  . Alcohol use: No  . Drug use: No  . Sexual activity: Not on file  Other Topics Concern  . Not on file  Social History Narrative  . Not on file   Social Determinants of Health   Financial Resource Strain: Not on file  Food Insecurity: Not on file  Transportation Needs: Not on file  Physical Activity: Not on file   Stress: Not on file  Social Connections: Not on file     Family History: The patient's family history includes Alcoholism in her father; Cancer in her maternal grandfather; Hypertension in her mother; Stroke in her paternal grandfather.  ROS:   Please see the history of present illness.    All other systems are reviewed and are negative.   EKGs/Labs/Other Studies Reviewed:    The following studies were reviewed today:  Echocardiogram 02/04/2021  1. Severe asymmetric septal hypertrophy (IVS 1.6 cm, PW 1.2-1.3 cm).  Hyperdynamic LV function, 70-75%. There is SAM of the mitral valve with  LVOT obstruction up to 58 mmHG at rest.  The gradient increases to 91 mmHG  with valsalva. Findings are consistent  with the patient's known history of HOCM. Mild centrally directed MR.  Left ventricular ejection fraction, by estimation, is 70 to 75%. The left  ventricle has hyperdynamic function. The left ventricle has no regional  wall motion abnormalities. There is  severe asymmetric left ventricular hypertrophy of the septal segment. Left  ventricular diastolic function could not be evaluated.  2. Right ventricular systolic function is normal. The right ventricular  size is normal. Tricuspid regurgitation signal is inadequate for assessing  PA pressure.  3. Left atrial size was mildly dilated.  4. The mitral valve is degenerative. Mild mitral valve regurgitation. No  evidence of mitral stenosis. Moderate to severe mitral annular  calcification.  5. The aortic valve is tricuspid. There is mild calcification of the  aortic valve. There is mild thickening of the aortic valve. Aortic valve  regurgitation is not visualized. Mild aortic valve sclerosis is present,  with no evidence of aortic valve  stenosis.  6. The inferior vena cava is normal in size with greater than 50%  respiratory variability, suggesting right atrial pressure of 3 mmHg.   Echocardiogram 09/13/2019  1. Left  ventricular ejection fraction, by visual estimation, is 65 to 70%. The left ventricle has normal function. Normal left ventricular size. There is severely increased left ventricular hypertrophy.  2. Elevated mean left atrial pressure.  3. Left ventricular diastolic Doppler parameters are consistent with impaired relaxation pattern of LV diastolic filling.  4. Global right ventricle has normal systolic function.The right ventricular size is normal.  5. Left atrial size was moderately dilated.  6. Right atrial size was normal.  7. Moderate chordal systolic anterior motion of the mitral valve.  8. Moderate mitral annular calcification.  9. The mitral valve is normal in structure. Mild mitral valve regurgitation. No evidence of mitral stenosis. 10. The tricuspid valve is normal in structure. Tricuspid valve regurgitation is mild. 11. The aortic valve is tricuspid Aortic valve regurgitation was not visualized by color flow Doppler. Mild aortic valve sclerosis without stenosis. 12. The pulmonic valve was not well visualized. Pulmonic valve regurgitation is not visualized by color flow Doppler. 13. Mildly elevated pulmonary artery systolic pressure. 14. The inferior vena cava is normal in size with greater than 50% respiratory variability, suggesting right atrial pressure of 3 mmHg. 15. Hyperdynamic LV systolic function; asymmetric septal hypertrophy; chordal SAM with elevated LVOT gradient (4.2 m/s with valsalva); mild MR; moderate LAE; mild TR with mild pulmonary hypertension. Findings c/w HOCM.  EKG:  EKG is ordered today and shows normal sinus rhythm, borderline voltage criteria for LVH with subtle ST segment depression and T wave inversion in the lateral leads, old QS pattern in leads V1-V2 unchanged, QTC 444 ms  Recent Labs: 06/13/2020: BUN 16; Creatinine, Ser 0.71; Potassium 4.8; Sodium 144   September 19, 2019 labs at [facility] medical show glucose 96, BUN 15, creatinine 0.8, potassium 4.3, CO2 26,  sodium 140, WBC 7.1K, hemoglobin 13.9  Recent Lipid Panel August 31, 2019 total cholesterol 151, HDL 44, LDL 93, triglycerides 70, hemoglobin A1c 6.0% Hemoglobin A1c on 06/13/2020 was 6.0%  10/08/2020 Cholesterol 135, HDL 61, LDL 65, triglycerides 44 Hemoglobin A1c 5.7% Creatinine 0.71  Physical Exam:    VS:  BP 124/68   Pulse (!) 57   Ht  (1.626 m)   Wt 148 lb (67.1 kg)   SpO2 98%   BMI 25.40 kg/m     Wt Readings  from Last 3 Encounters:  05/22/21 148 lb (67.1 kg)  01/16/21 148 lb (67.1 kg)  07/26/20 138 lb 6.4 oz (62.8 kg)    General: Alert, oriented x3, no distress, appears well. Head: no evidence of trauma, PERRL, EOMI, no exophtalmos or lid lag, no myxedema, no xanthelasma; normal ears, nose and oropharynx Neck: normal jugular venous pulsations and no hepatojugular reflux; brisk carotid pulses without delay and no carotid bruits Chest: clear to auscultation, no signs of consolidation by percussion or palpation, normal fremitus, symmetrical and full respiratory excursions Cardiovascular: normal position and quality of the apical impulse, regular rhythm, normal first and second heart sounds, no diastolic murmurs, rubs or gallops.  There is a grade 2/6 mid peaking systolic ejection murmur heard exclusively in the apical area with limited radiation, increases to 3/6 following the Valsalva maneuver, attenuates but does not completely disappear with the bilateral handgrip maneuver Abdomen: no tenderness or distention, no masses by palpation, no abnormal pulsatility or arterial bruits, normal bowel sounds, no hepatosplenomegaly Extremities: no clubbing, cyanosis or edema; 2+ radial, ulnar and brachial pulses bilaterally; 2+ right femoral, posterior tibial and dorsalis pedis pulses; 2+ left femoral, posterior tibial and dorsalis pedis pulses; no subclavian or femoral bruits Neurological: grossly nonfocal Psych: Normal mood and affect    ASSESSMENT:    1. HOCM  (hypertrophic obstructive cardiomyopathy) (HCC)   2. Chronic diastolic heart failure (HCC)   3. Coronary artery disease involving native coronary artery of native heart without angina pectoris   4. Type 1 diabetes mellitus with complications (HCC)   5. Essential hypertension    PLAN:    In order of problems listed above: 1. HOCM: Mildly symptomatic, NYHA functional class II, but symptoms do interfere with work and daily activity.  She has a pretty high gradient at rest and a severe gradient with provocative maneuvers, despite being on the highest tolerated dose of beta-blocker with a heart rate in the 50s.  the hypertrophic cardiomyopathy was of late onset, asymptomatic until she had DKA.  Continue beta-blockers and try to minimize use of diuretics.   Following the FDA approval of mavacamten, we will have her meet with Dr. Izora Ribas to discuss initiation and titration of this medication.  Future septal myectomy also remains a treatment option. 2. CHF: Diastolic heart failure due to LV outflow tract obstruction and relaxation impairment.  Today she is clinically euvolemic.  Discouraged the use of diuretics since these will increase the outflow tract gradient. 3. CAD: She does not have exertional chest pain.  Moderate eccentric left main coronary artery stenosis seen on CT angiography.  Target LDL less than 70. 4. DM type 1: She is extremely insulin sensitive.  Exceptionally good glycemic control with a hemoglobin A1c of 5.7%. 5. HTN: Beta-blocker should be the mainstay of treatment, but the dose is limited by bradycardia.  Medication Adjustments/Labs and Tests Ordered: Current medicines are reviewed at length with the patient today.  Concerns regarding medicines are outlined above.  No orders of the defined types were placed in this encounter.  No orders of the defined types were placed in this encounter.   Patient Instructions  Medication Instructions:  No changes *If you need a refill  on your cardiac medications before your next appointment, please call your pharmacy*   Lab Work: None ordered If you have labs (blood work) drawn today and your tests are completely normal, you will receive your results only by: Marland Kitchen MyChart Message (if you have MyChart) OR . A paper copy  in the mail If you have any lab test that is abnormal or we need to change your treatment, we will call you to review the results.   Testing/Procedures: None ordered   Follow-Up: At [facility], you and your health needs are our priority.  As part of our continuing mission to provide you with exceptional heart care, we have created designated Provider Care Teams.  These Care Teams include your primary Cardiologist (physician) and Advanced Practice Providers (APPs -  Physician Assistants and Nurse Practitioners) who all work together to provide you with the care you need, when you need it.  We recommend signing up for the patient portal called "MyChart".  Sign up information is provided on this After Visit Summary.  MyChart is used to connect with patients for Virtual Visits (Telemedicine).  Patients are able to view lab/test results, encounter notes, upcoming appointments, etc.  Non-urgent messages can be sent to your provider as well.   To learn more about what you can do with MyChart, go to [email or web address].    Your next appointment:   6 month(s)  The format for your next appointment:   In Person  Provider:   You may see Thurmon FairMihai Sharod Petsch, MD or one of the following Advanced Practice Providers on your designated Care Team:    Azalee CourseHao Meng, PA-C  Micah FlesherAngela Duke, [place]-C or   Judy PimpleKrista Kroeger, [place]-C    Other Instructions Please keep your appointment to with Dr. Izora Ribashandrasekhar 7/22 at 9:[facility]      Signed, Thurmon FairMihai Clytee Heinrich, MD  05/22/2021 11:06 AM    [facility]

## 2021-05-22 NOTE — Patient Instructions (Addendum)
Medication Instructions:  No changes *If you need a refill on your cardiac medications before your next appointment, please call your pharmacy*   Lab Work: None ordered If you have labs (blood work) drawn today and your tests are completely normal, you will receive your results only by: Marland Kitchen MyChart Message (if you have MyChart) OR . A paper copy in the mail If you have any lab test that is abnormal or we need to change your treatment, we will call you to review the results.   Testing/Procedures: None ordered   Follow-Up: At [facility], you and your health needs are our priority.  As part of our continuing mission to provide you with exceptional heart care, we have created designated Provider Care Teams.  These Care Teams include your primary Cardiologist (physician) and Advanced Practice Providers (APPs -  Physician Assistants and Nurse Practitioners) who all work together to provide you with the care you need, when you need it.  We recommend signing up for the patient portal called "MyChart".  Sign up information is provided on this After Visit Summary.  MyChart is used to connect with patients for Virtual Visits (Telemedicine).  Patients are able to view lab/test results, encounter notes, upcoming appointments, etc.  Non-urgent messages can be sent to your provider as well.   To learn more about what you can do with MyChart, go to [email or web address].    Your next appointment:   6 month(s)  The format for your next appointment:   In Person  Provider:   You may see Thurmon Fair, MD or one of the following Advanced Practice Providers on your designated Care Team:    Azalee Course, PA-C  Micah Flesher, [place] or   Judy Pimple, [place]    Other Instructions Please keep your appointment to with Dr. Izora Ribas 7/22 at 9:40 am at the [facility].

## 2021-06-07 ENCOUNTER — Other Ambulatory Visit: Payer: Self-pay

## 2021-06-07 ENCOUNTER — Other Ambulatory Visit (HOSPITAL_BASED_OUTPATIENT_CLINIC_OR_DEPARTMENT_OTHER): Payer: Self-pay

## 2021-06-07 ENCOUNTER — Ambulatory Visit: Payer: BC Managed Care – PPO | Attending: Internal Medicine

## 2021-06-07 DIAGNOSIS — Z23 Encounter for immunization: Secondary | ICD-10-CM

## 2021-06-07 MED ORDER — COVID-19 MRNA VACC (MODERNA) 100 MCG/0.5ML IM SUSP
INTRAMUSCULAR | 0 refills | Status: DC
  Filled 2021-06-07: qty 0.25, 1d supply, fill #0

## 2021-06-07 NOTE — Progress Notes (Signed)
   Covid-19 Vaccination Clinic  Name:  RUMI KOLODZIEJ    MRN: [id number] DOB: Apr 27, 1961  06/07/2021  Ms. Kronk was observed post Covid-19 immunization for 15 minutes without incident. She was provided with Vaccine Information Sheet and instruction to access the V-Safe system.   Ms. Civil was instructed to call 911 with any severe reactions post vaccine: Difficulty breathing  Swelling of face and throat  A fast heartbeat  A bad rash all over body  Dizziness and weakness   Immunizations Administered     Name Date Dose VIS Date Route   Moderna Covid-19 Booster Vaccine 06/07/2021  2:01 PM 0.25 mL 10/03/2020 Intramuscular   Manufacturer: Moderna   Lot: [id number]   NDC: 93570-177-93

## 2021-07-04 NOTE — Progress Notes (Addendum)
Cardiology Office Note:    Date:  07/05/2021   ID:  Wanda Hicks, DOB August 27, 1961, MRN [id number]  PCP:  Geoffry Paradise, MD   [facility] Providers Cardiologist:  Thurmon Fair, MD     Referring MD: Geoffry Paradise, MD  CC:  HCM Clinic Visit for Mavacamten discussions.  History of Present Illness:    Wanda Hicks is a 60 y.o. 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 Presenting for evaluation 07/05/21.  Patient notes that she is doing well but feels tired.  Notes  at rest has slight SOB, with exertion feels dizzy at the top of the stairs, with  standing related dizziness and with no post prandial sx.  Dizziness has its ups and downs.  With only limited treadmill use can't get her heart rate above 90 without symptoms.  No issues with medication attainment.  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  Still working as a Tax adviser.     Notes no acute illness or RVR; no imaging evidence of decreased LVEF.    No chest pain or pressure .  No SOB at rest and no PND/Orthopnea.  No weight gain or leg swelling.  No palpitations.    Past Medical History:  Diagnosis Date   Arthritis    Bronchitis    Chronic diastolic heart failure (HCC) 10/19/2014   Diabetes mellitus without complication (HCC)    Type 1; pt has insulin pump   Family history of adverse reaction to anesthesia    Patients mother has N/V   GERD (gastroesophageal reflux disease)    Heart murmur    Hypertension    Pneumonia    hx of   Positive PPD    Shortness of breath dyspnea    uses Albuterol inhaler PRN    Past Surgical History:  Procedure Laterality Date   CARPAL TUNNEL RELEASE Bilateral    CERVICAL DISCECTOMY     X 2   CESAREAN SECTION     COLONOSCOPY     EYE SURGERY     as a child   MAXIMUM ACCESS (MAS)POSTERIOR LUMBAR INTERBODY  FUSION (PLIF) 1 LEVEL N/A 07/10/2016   Procedure: Lumbar four-five Maximum access posterior lumbar interbody fusion with resection of synovial cyst;  Surgeon: Maeola Harman, MD;  Location: [facility];  Service: Neurosurgery;  Laterality: N/A;   TRIGGER FINGER RELEASE Bilateral    ULNAR NERVE REPAIR Bilateral     Current Medications: Current Meds  Medication Sig   ACCU-CHEK AVIVA PLUS test strip    acetaminophen (TYLENOL) 500 MG tablet Take 1,000 mg by mouth as needed.   ADVAIR DISKUS 100-50 MCG/DOSE AEPB Inhale 1 puff into the lungs daily as needed.    albuterol (PROVENTIL HFA;VENTOLIN HFA) 108 (90 Base) MCG/ACT inhaler Inhale 1 puff into the lungs every 6 (six) hours as needed for wheezing or shortness of breath.   aspirin 81 MG EC tablet Take 81 mg by mouth daily.    Continuous Blood Gluc Sensor (FREESTYLE LIBRE 2 SENSOR) MISC Change every 14 days to monitor blood glucose continously   etodolac (LODINE) 400 MG tablet Take 400 mg by mouth daily.   fluconazole (DIFLUCAN) 150 MG tablet Take 150 mg by mouth as needed.   fluticasone (FLONASE) 50 MCG/ACT nasal spray Place 1 spray into both nostrils as needed.   hydroxypropyl methylcellulose /  hypromellose (ISOPTO TEARS / GONIOVISC) 2.5 % ophthalmic solution Place 1 drop into both eyes as needed for dry eyes.   insulin aspart (NOVOLOG) 100 UNIT/ML injection 30 Units.   Insulin Human (INSULIN PUMP) SOLN Inject into the skin.   INSULIN SYRINGE 1CC/29G 29G X 1/2" 1 ML MISC Use as directed to administer insulin. DX: 10.9   KRILL OIL PO Take 1 tablet by mouth daily.    levothyroxine (SYNTHROID) 125 MCG tablet Take 125 mcg by mouth daily before breakfast.    loratadine (CLARITIN) 10 MG tablet daily as needed.   losartan (COZAAR) 50 MG tablet Take 1 tablet (50 mg total) by mouth daily.   magnesium oxide (MAG-OX) 400 MG tablet Take 400 mg by mouth 2 (two) times daily.    metoprolol succinate (TOPROL-XL) 50 MG 24 hr tablet Take 1.5 tablets (75 mg total)  by mouth daily. Take with or immediately following a meal.   Multiple Vitamin (MULTIVITAMIN) tablet Take 1 tablet by mouth daily.   Multiple Vitamins-Minerals (PRESERVISION AREDS 2) CAPS    OneTouch Delica Lancets 33G MISC use as directed to test blood sugar 4-6 times daily, DX: E10.9   rosuvastatin (CRESTOR) 40 MG tablet Take 40 mg by mouth daily.   [DISCONTINUED] COVID-19 mRNA vaccine, Moderna, 100 MCG/0.5ML injection Inject into the muscle.     Allergies:   Empagliflozin and Latex   Social History   Socioeconomic History   Marital status: Married    Spouse name: Not on file   Number of children: Not on file   Years of education: Not on file   Highest education level: Not on file  Occupational History   Not on file  Tobacco Use   Smoking status: Never   Smokeless tobacco: Never  Substance and Sexual Activity   Alcohol use: No   Drug use: No   Sexual activity: Not on file  Other Topics Concern   Not on file  Social History Narrative   Not on file   Social Determinants of Health   Financial Resource Strain: Not on file  Food Insecurity: Not on file  Transportation Needs: Not on file  Physical Activity: Not on file  Stress: Not on file  Social Connections: Not on file     Family History: The patient's family history includes Alcoholism in her father; Cancer in her maternal grandfather; Hypertension in her mother; Stroke in her paternal grandfather.  ROS:   Please see the history of present illness.     All other systems reviewed and are negative.  EKGs/Labs/Other Studies Reviewed:    The following studies were reviewed today:  Transthoracic Echocardiogram: Date: 02/04/21 Results:  1. Severe asymmetric septal hypertrophy (IVS 1.6 cm, PW 1.2-1.3 cm).  Hyperdynamic LV function, 70-75%. There is SAM of the mitral valve with  LVOT obstruction up to 58 mmHG at rest. The gradient increases to 91 mmHG  with valsalva. Findings are consistent   with the patient's known  history of HOCM. Mild centrally directed MR.  Left ventricular ejection fraction, by estimation, is 70 to 75%. The left  ventricle has hyperdynamic function. The left ventricle has no regional  wall motion abnormalities. There is  severe asymmetric left ventricular hypertrophy of the septal segment. Left  ventricular diastolic function could not be evaluated.   2. Right ventricular systolic function is normal. The right ventricular  size is normal. Tricuspid regurgitation signal is inadequate for assessing  PA pressure.   3. Left atrial size was mildly dilated.  4. The mitral valve is degenerative. Mild mitral valve regurgitation. No  evidence of mitral stenosis. Moderate to severe mitral annular  calcification.   5. The aortic valve is tricuspid. There is mild calcification of the  aortic valve. There is mild thickening of the aortic valve. Aortic valve  regurgitation is not visualized. Mild aortic valve sclerosis is present,  with no evidence of aortic valve  stenosis.   6. The inferior vena cava is normal in size with greater than 50%  respiratory variability, suggesting right atrial pressure of 3 mmHg.   Recent Labs: No results found for requested labs within last 8760 hours.  Recent Lipid Panel No results found for: CHOL, TRIG, HDL, CHOLHDL, VLDL, LDLCALC, LDLDIRECT   Physical Exam:    VS:  BP 130/60   Pulse (!) 59   Ht 5\' 4"  (1.626 m)   Wt 144 lb 12.8 oz (65.7 kg)   SpO2 97%   BMI 24.85 kg/m     Wt Readings from Last 3 Encounters:  07/05/21 144 lb 12.8 oz (65.7 kg)  05/22/21 148 lb (67.1 kg)  01/16/21 148 lb (67.1 kg)     GEN:  Well nourished, well developed in no acute distress HEENT: Normal NECK: No JVD LYMPHATICS: No lymphadenopathy CARDIAC: RRR, notable systolic crescendo worse with hand grib RESPIRATORY:  Clear to auscultation without rales, wheezing or rhonchi  ABDOMEN: Soft, non-tender, non-distended MUSCULOSKELETAL:  No edema; No deformity  SKIN: Warm  and dry NEUROLOGIC:  Alert and oriented x 3 PSYCHIATRIC:  Normal affect   ASSESSMENT:    1. HOCM (hypertrophic obstructive cardiomyopathy) (HCC)    PLAN:    Hypertrophic Cardiomyopathy - with presents for discussion of mavacamten and myosin modulation medication - Gradient type: (LVOT), severe (+50 mm Hg) at rest - NYHA Class III symptoms; symptoms with standing up - on BB only without secondary modulating medication - No Notable CYP modulation  - Not pregnant or breast feeding (NA) - Insurance is Commercial - discussed the benefits in quality of life and fitness (pVO2), and discussed the benefits of starting mavacamten - discussed the REMS program:  will need to strictly keep up with medication and supplement checks due to potential interactions - will need to watch for chest pain, SOB, water weight gain, palpitations, leg swelling, or syncope different than obstruction related (syncope prior to the medication start) - if critical illness (Sepsis, AF RVR, new HFrEF) medication will be stopped and we will see the patient in follow up for potential safe restart - Patient will need an echo (Limited for LVEF, MR, and HOCM- 03/16/21) every 4 weeks until titrated at 12 weeks; then 12 week follow up (schedule now) - will get virtual visits after these assessments (scheduled now) - medications that need augmented: None  Summary: - willing to start Camzyos 5 mg  Queries for team based triage: Dosage forms and strength: 2.5 mg- light purple 5 mg (starting dose)- yellow  10 mg- pink 15 mg- gray (not used presently)  Planned for eval September 6th October 7th November 4th  Time Spent Directly with Patient:   I have spent a total of 40 minutes with the patient reviewing notes, imaging, EKGs, labs and examining the patient as well as establishing an assessment and plan that was discussed personally with the patient.  > 50% of time was spent in direct patient care.  Planned for  Camzyos start.      Medication Adjustments/Labs and Tests Ordered: Current medicines are reviewed at  length with the patient today.  Concerns regarding medicines are outlined above.  Orders Placed This Encounter  Procedures   ECHOCARDIOGRAM COMPLETE   ECHOCARDIOGRAM COMPLETE   ECHOCARDIOGRAM COMPLETE   ECHOCARDIOGRAM COMPLETE   No orders of the defined types were placed in this encounter.   Patient Instructions  Medication Instructions:  Your physician has recommended you make the following change in your medication:  START: mavacamten (Camzyos) 5 mg by mouth daily  *If you need a refill on your cardiac medications before your next appointment, please call your pharmacy*   Lab Work: NONE If you have labs (blood work) drawn today and your tests are completely normal, you will receive your results only by: MyChart Message (if you have MyChart) OR A paper copy in the mail If you have any lab test that is abnormal or we need to change your treatment, we will call you to review the results.   Testing/Procedures: Your physician has requested that you have an echocardiogram. Echocardiography is a painless test that uses sound waves to create images of your heart. It provides your doctor with information about the size and shape of your heart and how well your heart's chambers and valves are working. This procedure takes approximately one hour. There are no restrictions for this procedure.    Follow-Up: At [facility], you and your health needs are our priority.  As part of our continuing mission to provide you with exceptional heart care, we have created designated Provider Care Teams.  These Care Teams include your primary Cardiologist (physician) and Advanced Practice Providers (APPs -  Physician Assistants and Nurse Practitioners) who all work together to provide you with the care you need, when you need it.   Your next appointment:   4 week(s)  The format for your next  appointment:   Virtual Visit   Provider:   You may see Riley Lam, MD     Other Instructions You will need an Echo and Virtual visit with  Dr. Izora Ribas on the following days:   Sept. 6 Oct. 7 Nov. 4    Signed, Christell Constant, MD  07/05/2021 11:21 AM    [facility]

## 2021-07-05 ENCOUNTER — Encounter: Payer: Self-pay | Admitting: Internal Medicine

## 2021-07-05 ENCOUNTER — Ambulatory Visit: Payer: BC Managed Care – PPO | Admitting: Internal Medicine

## 2021-07-05 ENCOUNTER — Other Ambulatory Visit: Payer: Self-pay

## 2021-07-05 VITALS — BP 130/60 | HR 59 | Ht 64.0 in | Wt 144.8 lb

## 2021-07-05 DIAGNOSIS — Z79899 Other long term (current) drug therapy: Secondary | ICD-10-CM

## 2021-07-05 DIAGNOSIS — I421 Obstructive hypertrophic cardiomyopathy: Secondary | ICD-10-CM | POA: Diagnosis not present

## 2021-07-05 NOTE — Patient Instructions (Signed)
Medication Instructions:  Your physician has recommended you make the following change in your medication:  START: mavacamten (Camzyos) 5 mg by mouth daily  *If you need a refill on your cardiac medications before your next appointment, please call your pharmacy*   Lab Work: NONE If you have labs (blood work) drawn today and your tests are completely normal, you will receive your results only by: MyChart Message (if you have MyChart) OR A paper copy in the mail If you have any lab test that is abnormal or we need to change your treatment, we will call you to review the results.   Testing/Procedures: Your physician has requested that you have an echocardiogram. Echocardiography is a painless test that uses sound waves to create images of your heart. It provides your doctor with information about the size and shape of your heart and how well your heart's chambers and valves are working. This procedure takes approximately one hour. There are no restrictions for this procedure.    Follow-Up: At [facility], you and your health needs are our priority.  As part of our continuing mission to provide you with exceptional heart care, we have created designated Provider Care Teams.  These Care Teams include your primary Cardiologist (physician) and Advanced Practice Providers (APPs -  Physician Assistants and Nurse Practitioners) who all work together to provide you with the care you need, when you need it.   Your next appointment:   4 week(s)  The format for your next appointment:   Virtual Visit   Provider:   You may see Riley Lam, MD     Other Instructions You will need an Echo and Virtual visit with  Dr. Izora Ribas on the following days:   Sept. 6 Oct. 7 Nov. 4

## 2021-07-09 ENCOUNTER — Telehealth: Payer: Self-pay | Admitting: Pharmacist

## 2021-07-09 NOTE — Telephone Encounter (Signed)
Called and spoke with patient.  Patient aware Camzyos will be emailing her patietn authorization forms.  Attempted to enroll pt in copay card program however website is not working.  Gave patient the myCamzyos phone number.  [email or web address] [phone]

## 2021-07-09 NOTE — Telephone Encounter (Signed)
PA for Camzyos submitted.  [id number]

## 2021-07-11 ENCOUNTER — Telehealth: Payer: Self-pay

## 2021-07-11 NOTE — Telephone Encounter (Signed)
Monica with [facility] called and needs someone to call her back to answer clinical questions regarding a P/A for this pt.

## 2021-07-11 NOTE — Telephone Encounter (Addendum)
Received call from [facility] - pharmacy will be sending pt a 1 month free trial prescription of Camzyos. She's been enrolled in the Camzyos pt portal, her prior authorization with insurance is still pending.  Rx should be delivered to pt by August 1. Follow up with Dr Izora Ribas scheduled on Sept 6 along with echo. Confirmed with Dr Izora Ribas that first dose of Camzyos should be 30 days before first echo. Pt states she was advised to start on August 8 and that she's already been contacted about delivery.

## 2021-07-11 NOTE — Telephone Encounter (Addendum)
Fax received with PA questions. PA has been submitted to [facility].

## 2021-07-16 ENCOUNTER — Telehealth: Payer: Self-pay | Admitting: Internal Medicine

## 2021-07-16 NOTE — Telephone Encounter (Signed)
Patient has received the free trial of camzyos; bridge program will pay for medication will waiting for improval from insurance. [phone] ext. [phone] (charlene)

## 2021-07-17 NOTE — Telephone Encounter (Signed)
I have resent the form with the bridge program option checked- verified with Dr. Lenor Derrick.  PA was denied. Will send appeals with documentation that patient has Class II-III symptoms.

## 2021-07-18 ENCOUNTER — Ambulatory Visit (INDEPENDENT_AMBULATORY_CARE_PROVIDER_SITE_OTHER): Payer: BC Managed Care – PPO

## 2021-07-18 ENCOUNTER — Ambulatory Visit: Payer: BC Managed Care – PPO | Admitting: Podiatry

## 2021-07-18 ENCOUNTER — Other Ambulatory Visit: Payer: Self-pay

## 2021-07-18 ENCOUNTER — Encounter: Payer: Self-pay | Admitting: Podiatry

## 2021-07-18 DIAGNOSIS — S99922A Unspecified injury of left foot, initial encounter: Secondary | ICD-10-CM

## 2021-07-18 DIAGNOSIS — S92015A Nondisplaced fracture of body of left calcaneus, initial encounter for closed fracture: Secondary | ICD-10-CM | POA: Diagnosis not present

## 2021-07-19 NOTE — Progress Notes (Signed)
Subjective:   Patient ID: Wanda Hicks, female   DOB: 60 y.o.   MRN: [id number]   HPI Patient presents with extreme pain in the plantar aspect of the left heel and states that she fell off a ladder and has created stress on this and is having trouble bearing weight.  Also injured her arm and her hip.  Patient stated that it occurred 07/17/2021   Review of Systems  All other systems reviewed and are negative.      Objective:  Physical Exam Vitals and nursing note reviewed.  Constitutional:      Appearance: She is well-developed.  Pulmonary:     Effort: Pulmonary effort is normal.  Musculoskeletal:        General: Normal range of motion.  Skin:    General: Skin is warm.  Neurological:     Mental Status: She is alert.    Neurovascular status intact muscle strength found to be adequate range of motion adequate.  Patient is found to have swelling of the left heel with irritation plantarly medial and lateral but localized and is wearing a boot currently and is able to bear weight with the boot on the heel.  Patient has good digital perfusion currently and well oriented      Assessment:  Severe trauma with the possibility for fracture of the left heel bone secondary to injury     Plan:  H&P reviewed condition recommended aggressive ice therapy immobilization with Cam walker and offloading.  I did explain there is most likely a compression fracture but it should heal uneventfully.  X-rays indicate that there is indications of a possible compression fracture of the posterior aspect of the left heel localized

## 2021-07-19 NOTE — Telephone Encounter (Signed)
Refaxed paperwork and indicated that patient has NYHA class II-III symptoms and and LVEF is >50%

## 2021-07-22 NOTE — Telephone Encounter (Signed)
PA for Camyzos was approved.  [id number].  Approval runs through 10/20/21.

## 2021-07-22 NOTE — Telephone Encounter (Signed)
I confirmed with patient that she has been enrolled in the copay card program and the assistance for echos.  She asked about the timing of her follow up echo and then the virtual apt as there is no enough time to go home before virtual apt. She also needs to be home by 5PM bc that's when her help leaves for her mother in law with parkinsons.

## 2021-07-22 NOTE — Telephone Encounter (Signed)
PA for Camzyos approved through 10/20/21.

## 2021-07-26 NOTE — Telephone Encounter (Signed)
Patient was asking where she is supposed to do her virtual appointment since it is right after her echo

## 2021-07-26 NOTE — Telephone Encounter (Signed)
The patient expressed concern to me that she has to be home by 5PM. If the echo is at 3:45 and lasts an hour- she cant do the virtual apt while driving, so she wont be able to be home by 5

## 2021-07-28 ENCOUNTER — Telehealth: Payer: Self-pay | Admitting: Internal Medicine

## 2021-07-28 NOTE — Telephone Encounter (Signed)
Called patient related to Camzyos. Patient had two days of fatigue after starting the medication. This may be related to having multiple family members having health problems. Recently fracture her ankle after falling off a ladder (mechanical) and is in a boot and has not tried her normal exertion.  Started medication for 07/22/21.  We can mover her virtual visit the day after presently for the visit that is causing difficulty with her schedule.  She has 4, 8, and 12 week follow up echo for assessment of Camzyos.  Patient had no further questions.  Riley Lam, MD Cardiologist [facility]  [address] [place], [address] [place], [place] [address] [phone]  4:06 PM

## 2021-07-31 ENCOUNTER — Telehealth: Payer: Self-pay | Admitting: Pharmacist

## 2021-07-31 NOTE — Telephone Encounter (Signed)
Forwarding as an Firefighter prescriptions will need to be sent to [facility] per [employer].

## 2021-08-20 ENCOUNTER — Other Ambulatory Visit: Payer: Self-pay

## 2021-08-20 ENCOUNTER — Ambulatory Visit (HOSPITAL_COMMUNITY): Payer: BC Managed Care – PPO | Attending: Internal Medicine

## 2021-08-20 ENCOUNTER — Telehealth: Payer: BC Managed Care – PPO | Admitting: Internal Medicine

## 2021-08-20 ENCOUNTER — Telehealth: Payer: Self-pay | Admitting: Internal Medicine

## 2021-08-20 DIAGNOSIS — I421 Obstructive hypertrophic cardiomyopathy: Secondary | ICD-10-CM | POA: Diagnosis present

## 2021-08-20 LAB — ECHOCARDIOGRAM COMPLETE
Area-P 1/2: 2.33 cm2
S' Lateral: 2.5 cm

## 2021-08-20 NOTE — Telephone Encounter (Signed)
Called Patient to discuss HCM therapy - notes that she has been fatigued but isn't clear if its related to her life related (she has been very busy with taking care of her mother and family) - has had limited activity but related to her foot; her dizzy spells and her exertional SOB has improved; able to take the stairs at work - is trying to get Cataract surgery - no new medications - LVEF is essentially the same; LVOT gradient < 30 mm Hg except for a few beats after PACs  Assessment Transition from severe gradient to labile gradient  Plan  - patient to start exercising more - no barriers to surgery - please order mavacamten 5 mg; we will not change the dose at this time.  If exercise limitation persists, we will consider change based on echo at next visit  Patient had no further questions.  Specialty Pharmacy for [facility]: [phone].  Riley Lam, MD Cardiologist [facility]  [address] [place], [address] [place], [place] [address] [phone]  5:40 PM

## 2021-08-21 NOTE — Telephone Encounter (Signed)
Called [facility] pharmacy: [phone] to clarify where to send script.  Per Riki Rusk pharmacist- electronic script to be sent to [address], [place], [place].   I informed pharmacist that I was given strict instructions to send script to [place]. I was told [place] is the main center and orders are disbursed to local pharmacy.  Desma Mcgregor took a verbal order for mavacamten 5 mg PO QD. Reviewed allergies and medication list, fluconazole is contraindicated.  I called pt to inform her not to take fluconazole; she was made aware previously and has not to taken medication. Medication removed from med list.  Pt reports she has enough pills to last until Sunday.   I advised her to answer calls from [phone] number as it may be the pharmacy.  No further questions/ concerns.

## 2021-08-23 NOTE — Telephone Encounter (Signed)
Called pharmacy to inquire on mavacamten delivery.  Staff reports that med should have been delivered yesterday.  Called pt and she reports that she has received medication.

## 2021-09-03 ENCOUNTER — Telehealth: Payer: Self-pay | Admitting: Pharmacist

## 2021-09-03 NOTE — Telephone Encounter (Signed)
Camzyos patient status form completed for patient for next refill of Camzyos 5mg 

## 2021-09-20 ENCOUNTER — Other Ambulatory Visit: Payer: Self-pay

## 2021-09-20 ENCOUNTER — Telehealth: Payer: Self-pay

## 2021-09-20 ENCOUNTER — Ambulatory Visit (HOSPITAL_COMMUNITY): Payer: BC Managed Care – PPO | Attending: Cardiovascular Disease

## 2021-09-20 ENCOUNTER — Telehealth: Payer: BC Managed Care – PPO | Admitting: Internal Medicine

## 2021-09-20 DIAGNOSIS — I421 Obstructive hypertrophic cardiomyopathy: Secondary | ICD-10-CM | POA: Diagnosis present

## 2021-09-20 LAB — ECHOCARDIOGRAM LIMITED
Area-P 1/2: 2.8 cm2
S' Lateral: 2.1 cm

## 2021-09-20 NOTE — Telephone Encounter (Signed)
Spoke with pt she report that there is no change to her medications.  She denies symptoms of HF.  I informed [facility]  Pt had no questions or concerns.  Medication added to med list.

## 2021-09-20 NOTE — Telephone Encounter (Signed)
MD has resulted echo.  Patient to remain on mavacamten 5 mg PO QD.  [facility] given [facility] mavacmatem 5 mg.

## 2021-10-04 ENCOUNTER — Other Ambulatory Visit: Payer: Self-pay | Admitting: Internal Medicine

## 2021-10-08 ENCOUNTER — Telehealth: Payer: Self-pay | Admitting: Pharmacist

## 2021-10-08 NOTE — Telephone Encounter (Signed)
Camzyox form updated using Echo results from 09/20/21.  No dose change per Dr Izora Ribas notes.  Authorized refill

## 2021-10-09 ENCOUNTER — Telehealth: Payer: Self-pay | Admitting: Pharmacist

## 2021-10-09 NOTE — Telephone Encounter (Signed)
Received notice that Camzyos required a PA when patient was already approved.  Called patient to find out how many tablets she has left.  Left message on machine.

## 2021-10-09 NOTE — Telephone Encounter (Signed)
Patient called back.  Reports she has about 2 weeks of medication left.  Has not received notice that  she needs a prior authorization.

## 2021-10-09 NOTE — Telephone Encounter (Signed)
[employer] as we received a request from pharmacy that another PA is needed, insurance states PA is approved through 09/23/22 and the pharmacy was just trying to run the rx too soon, earliest allowable fill date is 11/2.

## 2021-10-11 ENCOUNTER — Ambulatory Visit: Payer: BC Managed Care – PPO

## 2021-10-18 ENCOUNTER — Telehealth: Payer: BC Managed Care – PPO | Admitting: Internal Medicine

## 2021-10-18 ENCOUNTER — Telehealth: Payer: Self-pay | Admitting: Internal Medicine

## 2021-10-18 ENCOUNTER — Other Ambulatory Visit: Payer: Self-pay

## 2021-10-18 ENCOUNTER — Ambulatory Visit (HOSPITAL_COMMUNITY): Payer: BC Managed Care – PPO | Attending: Internal Medicine

## 2021-10-18 DIAGNOSIS — I421 Obstructive hypertrophic cardiomyopathy: Secondary | ICD-10-CM | POA: Diagnosis not present

## 2021-10-18 DIAGNOSIS — Z79899 Other long term (current) drug therapy: Secondary | ICD-10-CM

## 2021-10-18 LAB — ECHOCARDIOGRAM LIMITED
Area-P 1/2: 3.08 cm2
S' Lateral: 2.3 cm

## 2021-10-18 NOTE — Telephone Encounter (Signed)
Called Patient and received her voicemail.  She has previously given [place] instructions to leave voicemail. - Echo is the same - Will continue medication - would like to arrange follow up visit to discuss next steps  Assessment - persistent but improved gradient HOCM, from severe to labile  Plan  - will refill Camzyos 5 mg - will need echo (limited) in 12 weeks - would bring patient back in December or January to discuss potential dose increase   Riley Lam, MD Cardiologist [facility]  [address] [place], [address] [place], [place] [address] [phone]  2:57 PM

## 2021-10-18 NOTE — Telephone Encounter (Signed)
Called and spoke to patient and made her aware of her echo results and recommendations to continue Camzyos at 5 mg QD. Patient denies starting any new meds or having any Sx of HF. Patient scheduled with Dr. Izora Ribas on 1/12 at 8:20 AM. Limited echo scheduled for 1/27 at 7:20. Patient Status Form completed online with new Rx. PharmD Laural Golden made aware. Instructed patient to let us know if she started any new meds or developed any Sx.

## 2021-10-18 NOTE — Addendum Note (Signed)
Addended by: Daleen Bo I on: 10/18/2021 04:56 PM   Modules accepted: Orders

## 2021-10-21 ENCOUNTER — Other Ambulatory Visit: Payer: Self-pay | Admitting: Internal Medicine

## 2021-10-23 ENCOUNTER — Telehealth: Payer: Self-pay | Admitting: Internal Medicine

## 2021-10-23 MED ORDER — CAMZYOS 5 MG PO CAPS: 1.0000 | ORAL_CAPSULE | Freq: Every day | ORAL | 0 refills | Status: DC

## 2021-10-23 NOTE — Telephone Encounter (Signed)
Her prior authorization has not expired. Previously received a request to renew the PA but when I called pt's insurance, was advised her PA is still active and approved through 09/23/22. Issue was pharmacy was trying to run the prescription too soon which resulted in [place] getting an incorrect message that the PA expired when it hadn't.  Returned call to Kevin with [employer] and informed her of the above. She also stated pt has been trying to get some back payment on her echo visits but that echo copay support wasn't checked on initial enrollment form. I have filled out a new enrollment form and submitted it per [employer].

## 2021-10-23 NOTE — Telephone Encounter (Signed)
Pt c/o medication issue:  1. Name of Medication: Mavacamten (CAMZYOS) 5 MG CAPS  2. How are you currently taking this medication (dosage and times per day)? As directed  3. Are you having a reaction (difficulty breathing--STAT)?   4. What is your medication issue?   The patient assistance foundation called and said the patient's Prior Auth for this medication expired 10/20/21. The office wants to know if the patient is still taking this medication   The specialists are available 8am-8pm M-F. If Westley Hummer is not available one of her colleagues will be able to help the office

## 2021-11-26 ENCOUNTER — Telehealth: Payer: Self-pay | Admitting: Internal Medicine

## 2021-11-26 NOTE — Telephone Encounter (Signed)
Agreed -

## 2021-11-26 NOTE — Telephone Encounter (Signed)
Pt c/o medication issue:  1. Name of Medication: Mavacamten (CAMZYOS) 5 MG CAPS  2. How are you currently taking this medication (dosage and times per day)? Take 1 capsule by mouth daily.  3. Are you having a reaction (difficulty breathing--STAT)? no  4. What is your medication issue? Because is a issue with this drug being mixed with steroid drugs that the patient is on which is dexametahasone. Please advise

## 2021-11-26 NOTE — Telephone Encounter (Signed)
Cathy from [facility] states they realized the patient started an dexamethasone eye drop, not and oral medication. So she says there should not be an interaction between the medications.

## 2021-11-26 NOTE — Telephone Encounter (Signed)
Mavacamten should be avoided with moderate or strong CYP 3A4 inducers or inhibitors. Dexamethasone is a major substrate of CYP 3A4 but only a weak inducer. Mavacamten is a CYP3A4 inducer as well. Main effects from this would be decreased concentrations of the dexamethasone. There may be slightly reduced concentrations of mavacamten due to weak CYP 3A4 inducing effects of dexamethasone but it's not a clinically significant interaction and pt should be able to take her dexamethasone.   Another option would be to change to prednisone instead which is only a substrate of CYP3A4 and has no inducing/inhibiting effects. Mavacamten concentrations would be unaffected but prednisone concentrations may decrease from the drug interaction.

## 2021-12-24 NOTE — Progress Notes (Signed)
Cardiology Office Note:    Date:  12/26/2021   ID:  Wanda Hicks, DOB November 12, 1961, MRN [id number]  PCP:  Geoffry ParadiseAronson, Richard, MD   [facility] Providers Cardiologist:  Thurmon FairMihai Croitoru, MD     Referring MD: Geoffry ParadiseAronson, Richard, MD  CC:  Mavacamten follow up  History of Present Illness:    Wanda RunningSuzanne M Mckee is a 61 y.o. 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 Presenting for evaluation 07/05/21.  She was started on mavacamten 5 mg and completed a 12 week titration.  She has had improvement in symptoms.  On Mavacamten 5 mg, patient notes that she recovered from COVID-19. Prolonged but improved with albuterol.  Was at a work event and then ends up COVID-19.  Feels better on mavacamten.  Does note some leg swelling and is on compression stockings, but leg swelling occurs largely when she is on her feet all day.  Started walking at work in the hallways and did 45 minutes.  SOB-  On a scale of 0-4, were you short of breath during the past 24 hours? 0    On a scale of 0-5, were you short of breath during light physical activity such as walking slowly or cooking during the past 24 hours?        0  On a scale of 0-6, were you short of breath during moderate physical activity such as cleaning house or lifting heavy objects?        0  On a scale of 0-5, were you short of breath during heavy physical activity such as jogging or playing sports during the past 24 hours?        NA On a scale of 0-4, what was your shortness of breath at its worst during the past 24 hours?       0    On a scale of 0-4, How often did you have shortness of breath during the past 24 hours?        0  2. Fatigue- On a scale of 0-4, how tired were you during the past 24 hours?        2  3. Heart palpitations- On a scale of 0-4, how many times did your heart beat rapidly or flutter  (palpitations) during the past 24 hours?       0* 4. Chest pain- On a scale of 0-4, how many times did you have chest pain during the past 24 hours?       0 5. Dizziness-  On a scale of 0-5, how  dizzy or light-headed were you during the past 24 hours?       0 6. Syncope- Did you faint or lose consciousness during the past 24 hours? No (non-numerical)  Total symptom burden 2     Past Medical History:  Diagnosis Date   Arthritis    Bronchitis    Chronic diastolic heart failure (HCC) 10/19/2014   Diabetes mellitus without complication (HCC)    Type 1; pt has insulin pump   Family history of adverse reaction to anesthesia    Patients mother has N/V   GERD (gastroesophageal reflux disease)    Heart murmur    Hypertension    Pneumonia    hx of   Positive PPD    Shortness of breath dyspnea    uses Albuterol inhaler PRN    Past Surgical History:  Procedure Laterality Date   CARPAL TUNNEL RELEASE Bilateral    CERVICAL DISCECTOMY     X 2   CESAREAN SECTION     COLONOSCOPY     EYE SURGERY     as a child   MAXIMUM ACCESS (MAS)POSTERIOR LUMBAR INTERBODY FUSION (PLIF) 1 LEVEL N/A 07/10/2016   Procedure: Lumbar four-five Maximum access posterior lumbar interbody fusion with resection of synovial cyst;  Surgeon: Maeola Harman, MD;  Location: [facility];  Service: Neurosurgery;  Laterality: N/A;   TRIGGER FINGER RELEASE Bilateral    ULNAR NERVE REPAIR Bilateral     Current Medications: Current Meds  Medication Sig   ACCU-CHEK AVIVA PLUS test strip    acetaminophen (TYLENOL) 500 MG tablet Take 1,000 mg by mouth every 6 (six) hours as needed (pain).   ADVAIR DISKUS 100-50 MCG/DOSE AEPB Inhale 1 puff into the lungs daily as needed (as directed).   albuterol (PROVENTIL HFA;VENTOLIN HFA) 108 (90 Base) MCG/ACT inhaler Inhale 1 puff into the lungs every 6 (six) hours as needed for wheezing or shortness of breath.   aspirin 81 MG EC tablet Take 81 mg by mouth daily.    Continuous Blood  Gluc Sensor (FREESTYLE LIBRE 2 SENSOR) MISC Change every 14 days to monitor blood glucose continously   etodolac (LODINE) 400 MG tablet Take 400-800 mg by mouth daily.   fluticasone (FLONASE) 50 MCG/ACT nasal spray Place 1 spray into both nostrils as needed (as directed).   hydroxypropyl methylcellulose / hypromellose (ISOPTO TEARS / GONIOVISC) 2.5 % ophthalmic solution Place 1 drop into both eyes as needed for dry eyes (as directed).   insulin aspart (NOVOLOG) 100 UNIT/ML injection 30 Units as directed.   Insulin Disposable Pump (OMNIPOD 5 G6 POD, GEN 5,) MISC Inject into the skin as directed.   Insulin Human (INSULIN PUMP) SOLN Inject into the skin as directed.   INSULIN SYRINGE 1CC/29G 29G X 1/2" 1 ML MISC Use as directed to administer insulin. DX: 10.9   KRILL OIL PO Take 1 tablet by mouth daily.    levothyroxine (SYNTHROID) 125 MCG tablet Take 125 mcg by mouth daily before breakfast.    loratadine (CLARITIN) 10 MG tablet Take 10 mg by mouth daily as needed for allergies.   losartan (COZAAR) 50 MG tablet Take 1 tablet (50 mg total) by mouth daily.   magnesium oxide (MAG-OX) 400 MG tablet Take 400 mg by mouth 2 (two) times daily.    Mavacamten (CAMZYOS) 5 MG CAPS Take 1 capsule by mouth daily.   metoprolol succinate (TOPROL-XL) 50 MG 24 hr tablet Take 1.5 tablets (75 mg total) by mouth daily. Take with or immediately following a meal.   Multiple Vitamin (MULTIVITAMIN) tablet Take 1 tablet by mouth daily.   Multiple Vitamins-Minerals (PRESERVISION AREDS 2) CAPS as directed.   OneTouch Delica Lancets 33G MISC use as directed to test blood sugar 4-6 times daily, DX: E10.9   rosuvastatin (CRESTOR) 40 MG tablet Take 40 mg by mouth daily.     Allergies:   Empagliflozin and Latex   Social History   Socioeconomic History   Marital status: Married    Spouse name: Not on file   Number of children: Not on file   Years of education: Not on file   Highest education level: Not on file   Occupational History   Not on file  Tobacco Use   Smoking status: Never   Smokeless tobacco: Never  Substance and Sexual Activity   Alcohol use: No  Drug use: No   Sexual activity: Not on file  Other Topics Concern   Not on file  Social History Narrative   Not on file   Social Determinants of Health   Financial Resource Strain: Not on file  Food Insecurity: Not on file  Transportation Needs: Not on file  Physical Activity: Not on file  Stress: Not on file  Social Connections: Not on file    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  Mother has health issues.  Family History: The patient's family history includes Alcoholism in her father; Cancer in her maternal grandfather; Hypertension in her mother; Stroke in her paternal grandfather.  ROS:   Please see the history of present illness.     All other systems reviewed and are negative.  EKGs/Labs/Other Studies Reviewed:    The following studies were reviewed today:  Transthoracic Echocardiogram: Date: 10/18/21 Results:    1. Left ventricular ejection fraction, by estimation, is 60 to 65%. Left  ventricular ejection fraction by 3D volume is 67 %. The left ventricle has  normal function. The left ventricle has no regional wall motion  abnormalities. There is severe eccentric  left ventricular hypertrophy. Left ventricular diastolic parameters are  consistent with Grade II diastolic dysfunction (pseudonormalization). The  average left ventricular global longitudinal strain is -18.1 %. The global  longitudinal strain is normal.  Resting gradient of 24 mm Hg; Valsalva Gradient of 57 mm Hg.   2. Right ventricular systolic function is normal. The right ventricular  size is normal. Tricuspid regurgitation signal is inadequate for assessing  PA pressure.   3. The mitral valve is abnormal. Mild mitral valve regurgitation. No  evidence of mitral stenosis.   4. The aortic valve is tricuspid.   Comparison(s): LVOT  gradiant at rest, at valsalva.   Recent Labs: No results found for requested labs within last 8760 hours.  Recent Lipid Panel No results found for: CHOL, TRIG, HDL, CHOLHDL, VLDL, LDLCALC, LDLDIRECT   Physical Exam:    VS:  BP 140/78    Pulse (!) 53    Ht 5\' 4"  (1.626 m)    Wt 69.8 kg    SpO2 97%    BMI 26.40 kg/m     Wt Readings from Last 3 Encounters:  12/26/21 69.8 kg  07/05/21 65.7 kg  05/22/21 67.1 kg     GEN:  Well nourished, well developed in no acute distress HEENT: Normal NECK: No JVD LYMPHATICS: No lymphadenopathy CARDIAC: RRR, systolic murmur only with Valsalva RESPIRATORY:  Clear to auscultation without rales, wheezing or rhonchi  ABDOMEN: Soft, non-tender, non-distended MUSCULOSKELETAL:  No edema; No deformity  SKIN: Warm and dry NEUROLOGIC:  Alert and oriented x 3 PSYCHIATRIC:  Normal affect   ASSESSMENT:    1. HOCM (hypertrophic obstructive cardiomyopathy) (HCC)   2. Encounter for long-term (current) use of medications     PLAN:    Hypertrophic Cardiomyopathy - with presents for discussion of mavacamten and myosin modulation medication - Gradient type: Labile (induced with resting gradient < 30 mm Hg but induced to 40 mm Hg)  - NYHA Class II on medication - HCM SQ- 2 - No Notable CYP modulation - Insurance is covering medication; she has a $20 copay  - discussed the benefits in quality of life and fitness (pVO2), and discussed the benefits of starting mavacamten - discussed the REMS program:  will need to strictly keep up with medication and supplement checks due to potential interactions - will  need to watch for chest pain, SOB, water weight gain, palpitations, leg swelling, or syncope different than obstruction related (syncope prior to the medication start) - if critical illness (Sepsis, AF RVR, new HFrEF) medication will be stopped and we will see the patient in follow up for potential safe restart - will need echo's Q- 12 weeks on  stable dose of medication   Summary: - mavacamten 5 mg to be continued (minimally symptomatic on medication) - Echo is q 12 weeks - no indication for surgery at this time -will do other HCM safety evals at next visit (April 2023)   Queries for team based triage: Dosage forms and strength: 2.5 mg- light purple 5 mg (starting dose)- yellow  10 mg- pink 15 mg- gray (not used presently)  Time Spent Directly with Patient:   I have spent a total of 40 minutes with the patient reviewing notes, imaging, EKGs, labs and examining the patient as well as establishing an assessment and plan that was discussed personally with the patient.  > 50% of time was spent in direct patient care.    Medication Adjustments/Labs and Tests Ordered: Current medicines are reviewed at length with the patient today.  Concerns regarding medicines are outlined above.  Orders Placed This Encounter  Procedures   EKG 12-Lead   ECHOCARDIOGRAM LIMITED   No orders of the defined types were placed in this encounter.    Patient Instructions  Medication Instructions:  Your physician recommends that you continue on your current medications as directed. Please refer to the Current Medication list given to you today.  *If you need a refill on your cardiac medications before your next appointment, please call your pharmacy*   Lab Work: None ordered If you have labs (blood work) drawn today and your tests are completely normal, you will receive your results only by: MyChart Message (if you have MyChart) OR A paper copy in the mail If you have any lab test that is abnormal or we need to change your treatment, we will call you to review the results.   Testing/Procedures: Your physician has requested that you have a limited echocardiogram in 15 weeks. Echocardiography is a painless test that uses sound waves to create images of your heart. It provides your doctor with information about the size and shape of your  heart and how well your hearts chambers and valves are working. This procedure takes approximately one hour. There are no restrictions for this procedure.    Follow-Up: At [facility], you and your health needs are our priority.  As part of our continuing mission to provide you with exceptional heart care, we have created designated Provider Care Teams.  These Care Teams include your primary Cardiologist (physician) and Advanced Practice Providers (APPs -  Physician Assistants and Nurse Practitioners) who all work together to provide you with the care you need, when you need it.  Your next appointment:   March 26, 2022 @ 8:40 am  The format for your next appointment:   In Person  Provider:   Dr. Izora Ribashandrasekhar    Thank you for choosing [facility]!!   Dory HornSherri Price, RN [phone]    Signed, Christell ConstantMahesh A Vansh Reckart, MD  12/26/2021 9:23 AM    [facility]

## 2021-12-26 ENCOUNTER — Encounter: Payer: Self-pay | Admitting: Internal Medicine

## 2021-12-26 ENCOUNTER — Other Ambulatory Visit: Payer: Self-pay

## 2021-12-26 ENCOUNTER — Ambulatory Visit: Payer: BC Managed Care – PPO | Admitting: Internal Medicine

## 2021-12-26 VITALS — BP 140/78 | HR 53 | Ht 64.0 in | Wt 153.8 lb

## 2021-12-26 DIAGNOSIS — Z79899 Other long term (current) drug therapy: Secondary | ICD-10-CM

## 2021-12-26 DIAGNOSIS — I421 Obstructive hypertrophic cardiomyopathy: Secondary | ICD-10-CM | POA: Diagnosis not present

## 2021-12-26 NOTE — Patient Instructions (Signed)
Medication Instructions:  Your physician recommends that you continue on your current medications as directed. Please refer to the Current Medication list given to you today.  *If you need a refill on your cardiac medications before your next appointment, please call your pharmacy*   Lab Work: None ordered If you have labs (blood work) drawn today and your tests are completely normal, you will receive your results only by: MyChart Message (if you have MyChart) OR A paper copy in the mail If you have any lab test that is abnormal or we need to change your treatment, we will call you to review the results.   Testing/Procedures: Your physician has requested that you have a limited echocardiogram in 15 weeks. Echocardiography is a painless test that uses sound waves to create images of your heart. It provides your doctor with information about the size and shape of your heart and how well your hearts chambers and valves are working. This procedure takes approximately one hour. There are no restrictions for this procedure.    Follow-Up: At [facility], you and your health needs are our priority.  As part of our continuing mission to provide you with exceptional heart care, we have created designated Provider Care Teams.  These Care Teams include your primary Cardiologist (physician) and Advanced Practice Providers (APPs -  Physician Assistants and Nurse Practitioners) who all work together to provide you with the care you need, when you need it.  Your next appointment:   March 26, 2022 @ 8:40 am  The format for your next appointment:   In Person  Provider:   Dr. Izora Ribas    Thank you for choosing [facility]!!   Dory Horn, RN [phone]

## 2021-12-30 ENCOUNTER — Telehealth: Payer: Self-pay | Admitting: Pharmacist

## 2021-12-30 ENCOUNTER — Other Ambulatory Visit: Payer: Self-pay

## 2021-12-30 MED ORDER — CAMZYOS 5 MG PO CAPS: 1.0000 | ORAL_CAPSULE | Freq: Every day | ORAL | 3 refills | Status: DC

## 2021-12-30 NOTE — Telephone Encounter (Signed)
Pt's medication was sent to pt's pharmacy as requested. Confirmation received.  °

## 2021-12-30 NOTE — Telephone Encounter (Signed)
[facility] called stating they need the REMS portal- patient status form updated. They also need new Rx sent.

## 2021-12-31 ENCOUNTER — Telehealth: Payer: Self-pay | Admitting: Pharmacist

## 2021-12-31 NOTE — Telephone Encounter (Signed)
Called [facility] spoke with [place].  Was told that pt has a shipment of Camzyos that is due to be shipped 01/02/22.  Pt will need counseling with pharmacist prior to med being shipped.

## 2021-12-31 NOTE — Telephone Encounter (Signed)
Cam[employer]os REMS form completed for patient

## 2021-12-31 NOTE — Telephone Encounter (Signed)
I called patient. She states she has enough medication until Monday. I advised that we would need to move her echo up so that we are able to enter the information in the REMS program and send in new Rx. I advised someone would call her to try to reschedule the echo. She stated that it "is going to have to depend on my schedule. My life doesn't revolve around Camzyos. The appointment was made on that day for a reason"

## 2022-01-03 NOTE — Telephone Encounter (Signed)
Patient status form was submitted by Laural Golden

## 2022-01-10 ENCOUNTER — Ambulatory Visit (HOSPITAL_COMMUNITY): Payer: BC Managed Care – PPO | Attending: Cardiovascular Disease

## 2022-01-10 ENCOUNTER — Other Ambulatory Visit: Payer: Self-pay

## 2022-01-10 DIAGNOSIS — Z79899 Other long term (current) drug therapy: Secondary | ICD-10-CM

## 2022-01-10 DIAGNOSIS — I421 Obstructive hypertrophic cardiomyopathy: Secondary | ICD-10-CM | POA: Insufficient documentation

## 2022-01-10 LAB — ECHOCARDIOGRAM LIMITED
Area-P 1/2: 2.99 cm2
S' Lateral: 2 cm

## 2022-02-12 ENCOUNTER — Other Ambulatory Visit: Payer: Self-pay | Admitting: Cardiovascular Disease

## 2022-03-07 ENCOUNTER — Telehealth: Payer: Self-pay | Admitting: *Deleted

## 2022-03-07 NOTE — Telephone Encounter (Signed)
Pt has appt 03/28/22 with Dr. Izora Ribas. I have added pre op clearance needed to appt notes. I will send FYI to requesting office pt has appt 03/28/22.  ?

## 2022-03-07 NOTE — Telephone Encounter (Signed)
? ?  Pre-operative Risk Assessment  ?  ?Patient Name: Wanda Hicks  ?DOB: Oct 04, 1961 ?MRN: [id number]  ? ?  ? ?Request for Surgical Clearance   ? ?Procedure:   LUMBAR SPINE INJECTION ? ?Date of Surgery:  Clearance TBD                              ?   ?Surgeon:  DR. DAVE EICHMAN ?Surgeon's Group or Practice Name:  [facility] ?Phone number:  [phone] ?Fax number:  [phone] ?  ?Type of Clearance Requested:   ?- Medical  ?- Pharmacy:  Hold Aspirin   ?  ?Type of Anesthesia:  Not Indicated ?  ?Additional requests/questions:   ? ?Signed, ?Julaine Hua   ?03/07/2022, 1:17 PM  ? ?

## 2022-03-07 NOTE — Telephone Encounter (Signed)
Preoperative team, patient has follow-up appointment 03/28/22.  Please add preoperative cardiac evaluation to appointment note.  I will defer cardiac clearance to provider at that time.  I will remove patient from the preoperative pool.  Thank you for your help. ? ?Wanda Hicks. Mar Zettler NP-C ? ?  ?03/07/2022, 1:45 PM ?[facility] ?[address] ?Office [phone] Fax [phone] ? ?

## 2022-03-25 ENCOUNTER — Ambulatory Visit (HOSPITAL_COMMUNITY): Payer: BC Managed Care – PPO | Attending: Cardiology

## 2022-03-25 ENCOUNTER — Telehealth: Payer: Self-pay | Admitting: Internal Medicine

## 2022-03-25 ENCOUNTER — Other Ambulatory Visit (HOSPITAL_COMMUNITY): Payer: BC Managed Care – PPO

## 2022-03-25 DIAGNOSIS — I421 Obstructive hypertrophic cardiomyopathy: Secondary | ICD-10-CM | POA: Diagnosis present

## 2022-03-25 LAB — ECHOCARDIOGRAM LIMITED
Area-P 1/2: 3.21 cm2
S' Lateral: 2.4 cm

## 2022-03-25 NOTE — Telephone Encounter (Signed)
Called Patient to review echo. ?No changes in her medications. ?Reviewed her LVOT gradient  ? ?Was able to hike for about an hour this weekend  No symptoms.  ?No dizziness or SOB (index symptoms) ? ?Discussed given her LVEF and her severe gradient (EF 60% and LVOT gradient of 80 mm Hg would trial mavacamten 10 mg; echo in 4 weeks then 12 weeks ? ?Patient is going to get a knee injection  with [facility]; query of if there are drug interactions. ? ?Is going for cataract surgery and needs risk eval ? ?Is having issues with reimbursement for her echos ? ?Mavacamten 10 mg ?Echo in four weeks, then in 12 weeks ?Will come on Frday ?Will collaborate with Pharm D team- I cannot think of what medication would interact with mavacamten for the injection ?Will need to check in with patient and team about the reimbursement program ?Will keep her Friday appointment ? ?Patient had no further questions. ? ?Time Spent Directly with Patient: ?  ?I have spent a total of 20 minutes with the patient reviewing notes, imaging, as well as establishing an assessment and plan that was discussed personally with the patient.   ? ? ?Rudean Haskell, MD ?Cardiologist ?[facility]  ?[place], [place] ?[place], [place] [address] ?[phone]  ?5:30 PM ? ?

## 2022-03-26 NOTE — Telephone Encounter (Signed)
Likely would be either a cortisone or hyaluronic acid injection - either are fine with her mavacamten and don't interact. ?

## 2022-03-27 NOTE — Progress Notes (Signed)
?Cardiology Office Note:   ? ?Date:  03/28/2022  ? ?ID:  Wanda Hicks, DOB 09-07-1961, MRN [id number] ? ?PCP:  Geoffry Paradise, MD ?  ?[facility] Providers ?Cardiologist:  Thurmon Fair, MD    ? ?Referring MD: Geoffry Paradise, MD  ? ?CC:  Pre-OP on Mavacamten ? ?History of Present Illness:   ? ?Wanda Hicks is a 61 y.o. 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  ?2022:  She was started on mavacamten 5 mg and completed a 12 week titration.  She has had improvement in symptoms.   ?2023: Increase in gradient.  Started on mavacamten 10 mg. ? ?Patient notes that she is doing OK.   ?We had discussed her symptoms and elevated gradient at last phone note (03/25/22).  Has good days and bad days. ? ?We have reviewed her injection and there is no reaction with her mavacamten. ?EKG today is Sinus bradycardia with LVH and secondary repolarization- changed from prior; no barriers to surgery.  ? ? ?Past Medical History:  ?Diagnosis Date  ? Arthritis   ? Bronchitis   ? Chronic diastolic heart failure (HCC) 10/19/2014  ? Diabetes mellitus without complication (HCC)   ? Type 1; pt has insulin pump  ? Family history of adverse reaction to anesthesia   ? Patients mother has N/V  ? GERD (gastroesophageal reflux disease)   ? Heart murmur   ? Hypertension   ? Pneumonia   ? hx of  ? Positive PPD   ? Shortness of breath dyspnea   ? uses Albuterol inhaler PRN  ? ? ?Past Surgical History:  ?Procedure Laterality Date  ? CARPAL TUNNEL RELEASE Bilateral   ? CERVICAL DISCECTOMY    ? X 2  ? CESAREAN SECTION    ? COLONOSCOPY    ? EYE SURGERY    ? as a child  ? MAXIMUM ACCESS (MAS)POSTERIOR LUMBAR INTERBODY FUSION (PLIF) 1 LEVEL N/A 07/10/2016  ? Procedure: Lumbar four-five Maximum access posterior lumbar interbody fusion with resection of synovial cyst;  Surgeon: Maeola Harman, MD;  Location: [facility];  Service: Neurosurgery;  Laterality: N/A;  ? TRIGGER FINGER RELEASE Bilateral   ? ULNAR NERVE REPAIR Bilateral   ? ? ?Current Medications: ?Current Meds  ?Medication Sig  ? ACCU-CHEK AVIVA PLUS test strip   ? acetaminophen (TYLENOL) 500 MG tablet Take 1,000 mg by mouth every 6 (six) hours as needed (pain).  ? ADVAIR DISKUS 100-50 MCG/DOSE AEPB Inhale 1 puff into the lungs daily as needed (as directed).  ? albuterol (PROVENTIL HFA;VENTOLIN HFA) 108 (90 Base) MCG/ACT inhaler Inhale 1 puff into the lungs every 6 (six) hours as needed for wheezing or shortness of breath.  ? aspirin 81 MG EC tablet Take 81 mg by mouth daily.   ? Continuous Blood Gluc Sensor (FREESTYLE LIBRE 2 SENSOR) MISC Change every 14 days to monitor blood glucose continously  ? etodolac (LODINE) 400 MG tablet Take 400-800 mg by mouth daily.  ? fluticasone (FLONASE) 50 MCG/ACT nasal spray Place 1 spray into both nostrils as needed (as directed).  ? hydroxypropyl methylcellulose / hypromellose (ISOPTO TEARS / GONIOVISC) 2.5 % ophthalmic solution Place 1 drop into both eyes as needed for dry eyes (as directed).  ? insulin aspart (NOVOLOG) 100 UNIT/ML injection 30 Units as directed.  ? Insulin Disposable Pump (OMNIPOD 5 G6 POD, GEN 5,) MISC Inject into the  skin as directed.  ? Insulin Human (INSULIN PUMP) SOLN Inject into the skin as directed.  ? INSULIN SYRINGE 1CC/29G 29G X 1/2" 1 ML MISC Use as directed to administer insulin. DX: 10.9  ? KRILL OIL PO Take 1 tablet by mouth daily.   ? levothyroxine (SYNTHROID) 125 MCG tablet Take 125 mcg by mouth daily before breakfast.   ? loratadine (CLARITIN) 10 MG tablet Take 10 mg by mouth daily as needed for allergies.  ? losartan (COZAAR) 50 MG tablet TAKE ONE TABLET BY MOUTH DAILY  ? magnesium oxide (MAG-OX) 400 MG tablet Take 400 mg by mouth 2 (two) times daily.   ? Mavacamten (CAMZYOS) 5 MG CAPS Take 1 capsule by mouth daily.  ? Mavacamten 10 MG CAPS Take 10 mg by mouth daily.  ? metoprolol succinate  (TOPROL-XL) 50 MG 24 hr tablet Take 1.5 tablets (75 mg total) by mouth daily. Take with or immediately following a meal.  ? Multiple Vitamin (MULTIVITAMIN) tablet Take 1 tablet by mouth daily.  ? Multiple Vitamins-Minerals (PRESERVISION AREDS 2) CAPS as directed.  ? OneTouch Delica Lancets 33G MISC use as directed to test blood sugar 4-6 times daily, DX: E10.9  ? rosuvastatin (CRESTOR) 40 MG tablet Take 40 mg by mouth daily.  ?  ? ?Allergies:   Empagliflozin and Latex  ? ?Social History  ? ?Socioeconomic History  ? Marital status: Married  ?  Spouse name: Not on file  ? Number of children: Not on file  ? Years of education: Not on file  ? Highest education level: Not on file  ?Occupational History  ? Not on file  ?Tobacco Use  ? Smoking status: Never  ? Smokeless tobacco: Never  ?Substance and Sexual Activity  ? Alcohol use: No  ? Drug use: No  ? Sexual activity: Not on file  ?Other Topics Concern  ? Not on file  ?Social History Narrative  ? Not on file  ? ?Social Determinants of Health  ? ?Financial Resource Strain: Not on file  ?Food Insecurity: Not on file  ?Transportation Needs: Not on file  ?Physical Activity: Not on file  ?Stress: Not on file  ?Social Connections: Not on file  ?  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  Mother has health issues. ? ?Family History: ?The patient's family history includes Alcoholism in her father; Cancer in her maternal grandfather; Hypertension in her mother; Stroke in her paternal grandfather. ? ?ROS:   ?Please see the history of present illness.    ? All other systems reviewed and are negative. ? ?EKGs/Labs/Other Studies Reviewed:   ? ?The following studies were reviewed today: ? ?Transthoracic Echocardiogram: ?Date: 10/18/21 ?Results: ?  ? 1. Left ventricular ejection fraction, by estimation, is 60 to 65%. Left  ?ventricular ejection fraction by 3D volume is 67 %. The left ventricle has  ?normal function. The left ventricle has no regional wall motion   ?abnormalities. There is severe eccentric  ?left ventricular hypertrophy. Left ventricular diastolic parameters are  ?consistent with Grade II diastolic dysfunction (pseudonormalization). The  ?average left ventricular global longitudinal strain is -18.1 %. The global  ?longitudinal strain is normal.  ?Resting gradient of 24 mm Hg; Valsalva Gradient of 57 mm Hg.  ? 2. Right ventricular systolic function is normal. The right ventricular  ?size is normal. Tricuspid regurgitation signal is inadequate for assessing  ?PA pressure.  ? 3. The mitral valve is abnormal. Mild mitral valve regurgitation. No  ?evidence of mitral stenosis.  ?  4. The aortic valve is tricuspid.  ? ?Comparison(s): LVOT gradiant at rest, at valsalva.  ? ?Recent Labs: ?No results found for requested labs within last 8760 hours.  ?Recent Lipid Panel ?No results found for: CHOL, TRIG, HDL, CHOLHDL, VLDL, LDLCALC, LDLDIRECT ? ? ?Physical Exam:   ? ?VS:  BP 120/67   Pulse (!) 53   Ht 5\' 4"  (1.626 m)   Wt 156 lb (70.8 kg)   SpO2 98%   BMI 26.78 kg/m?    ? ?Wt Readings from Last 3 Encounters:  ?03/28/22 156 lb (70.8 kg)  ?12/26/21 153 lb 12.8 oz (69.8 kg)  ?07/05/21 144 lb 12.8 oz (65.7 kg)  ?  ? ?GEN:  Well nourished, well developed in no acute distress ?HEENT: Normal ?NECK: No JVD ?LYMPHATICS: No lymphadenopathy ?CARDIAC: RRR, systolic murmur worse with Valsalva ?RESPIRATORY:  Clear to auscultation without rales, wheezing or rhonchi  ?ABDOMEN: Soft, non-tender, non-distended ?MUSCULOSKELETAL:  No edema; No deformity  ?SKIN: Warm and dry ?NEUROLOGIC:  Alert and oriented x 3 ?PSYCHIATRIC:  Normal affect  ? ?ASSESSMENT:   ? ?1. HOCM (hypertrophic obstructive cardiomyopathy) (HCC)   ? ? ? ?PLAN:   ? ?Hypertrophic Cardiomyopathy ?- Gradient type: severe gradient 80 mm Hg with Valsalva ?- NYHA Class II on medication ?- No Notable CYP modulation ?- Insurance is covering medication; she has a $20 copay; we will reach out to industry as she is  having issues with her copay assist ?- discussed the benefits in quality of life and fitness (pVO2), and discussed the benefits of starting mavacamten ?- discussed the [facility] program:  will need to strictly keep up with [facility]

## 2022-03-28 ENCOUNTER — Encounter: Payer: Self-pay | Admitting: Internal Medicine

## 2022-03-28 ENCOUNTER — Ambulatory Visit: Payer: BC Managed Care – PPO | Admitting: Internal Medicine

## 2022-03-28 VITALS — BP 120/67 | HR 53 | Ht 64.0 in | Wt 156.0 lb

## 2022-03-28 DIAGNOSIS — I421 Obstructive hypertrophic cardiomyopathy: Secondary | ICD-10-CM | POA: Diagnosis not present

## 2022-03-28 NOTE — Patient Instructions (Addendum)
Medication Instructions:  ?Your physician has recommended you make the following change in your medication:  ?INCREASE: Camzyos to 10 mg by mouth once daily on April 07, 2022 ? ?*If you need a refill on your cardiac medications before your next appointment, please call your pharmacy* ? ? ?Lab Work: ?NONE ?If you have labs (blood work) drawn today and your tests are completely normal, you will receive your results only by: ?MyChart Message (if you have MyChart) OR ?A paper copy in the mail ?If you have any lab test that is abnormal or we need to change your treatment, we will call you to review the results. ? ? ?Testing/Procedures: ?May 05, 2022-Your physician has requested that you have an echocardiogram. Echocardiography is a painless test that uses sound waves to create images of your heart. It provides your doctor with information about the size and shape of your heart and how well your heart?s chambers and valves are working. This procedure takes approximately one hour. There are no restrictions for this procedure.  ? ?Around June 28, 2022- Your physician has requested that you have an echocardiogram. Echocardiography is a painless test that uses sound waves to create images of your heart. It provides your doctor with information about the size and shape of your heart and how well your heart?s chambers and valves are working. This procedure takes approximately one hour. There are no restrictions for this procedure.  ? ? ? ? ?Follow-Up: ?At [facility], you and your health needs are our priority.  As part of our continuing mission to provide you with exceptional heart care, we have created designated Provider Care Teams.  These Care Teams include your primary Cardiologist (physician) and Advanced Practice Providers (APPs -  Physician Assistants and Nurse Practitioners) who all work together to provide you with the care you need, when you need it. ? ?We recommend signing up for the patient portal called  "MyChart".  Sign up information is provided on this After Visit Summary.  MyChart is used to connect with patients for Virtual Visits (Telemedicine).  Patients are able to view lab/test results, encounter notes, upcoming appointments, etc.  Non-urgent messages can be sent to your provider as well.   ?To learn more about what you can do with MyChart, go to [email or web address].   ? ?Your next appointment:   ?12 week(s) ? ?The format for your next appointment:   ?Virtual Visit  ? ?Provider:   ?Riley Lam, MD   ? ? ? ?Important Information About Sugar ? ? ? ? ?  ?

## 2022-04-02 ENCOUNTER — Telehealth: Payer: Self-pay | Admitting: Internal Medicine

## 2022-04-02 NOTE — Telephone Encounter (Signed)
Called [facility] gave V.O for mavacamten 10 mg PO QD.  Spoke with [facility] pharmacist.  Advised that pt is d/t start medication on 04/07/22.  Pharmacist will place a note to have process expedited.   ?

## 2022-04-02 NOTE — Telephone Encounter (Signed)
? ?  Patient c/o Palpitations:  High priority if patient c/o lightheadedness, shortness of breath, or chest pain ? ?How long have you had palpitations/irregular HR/ Afib? Started Monday afternoon ? ?Are you having the symptoms now? No. Only happens when patient goes on a walk  ? ?Are you currently experiencing lightheadedness, SOB or CP? no ? ?Do you have a history of afib (atrial fibrillation) or irregular heart rhythm? yes ? ?Have you checked your BP or HR? (document readings if available):  ? ?Are you experiencing any other symptoms? Dizziness  ? ? ?Patient also states that her Mother fell on Sunday and she did some heavy lifting to get her back into bed on Sunday night. She noticed the palpitations started on Monday  ?

## 2022-04-02 NOTE — Telephone Encounter (Signed)
Called Patient in regard to worsening sx ?Discussed her severe gradient.  Notes that she has CP, palpitations and SOB with helping her mother after she fell out of bed. ?Still of 5 mg dose of Mavacamten ? ?Assessment ?Symptomatic oHCM with severe gradient ? ? ?Plan  ? ?We will increase mavacamten dose to 10 mg as previously discussed. ? ?Patient had no further questions. ? ?Rudean Haskell, MD ?Cardiologist ?[address]  ?, [place] ?[place],  [id number] ?[phone]  ?1:11 PM ? ?

## 2022-04-02 NOTE — Telephone Encounter (Signed)
Pt calling stating that Dr. Izora Ribas increased her medication Mavacamten to 10 mg tablets daily at last office visit on 03/28/22. This change is not on pt's medication list. Please address ?

## 2022-04-02 NOTE — Telephone Encounter (Signed)
?*  STAT* If patient is at the pharmacy, call can be transferred to refill team. ? ? ?1. Which medications need to be refilled? (please list name of each medication and dose if known) Mavacamten (CAMZYOS) 5 MG CAPS- ? ?2. Which pharmacy/location (including street and city if local pharmacy) is medication to be sent to? ?[place], [place] ? ?3. Do they need a 30 day or 90 day supply? 30 with refills ? ?Patient said that Dr. Gasper Sells increased this medication from 5 mg to 10 mg at her appt 03/28/22 but it was not sent to the pharmacy yet. She is supposed to start the new medication on Monday  ?

## 2022-05-05 ENCOUNTER — Ambulatory Visit (HOSPITAL_COMMUNITY): Payer: BC Managed Care – PPO

## 2022-05-06 ENCOUNTER — Other Ambulatory Visit: Payer: Self-pay | Admitting: Internal Medicine

## 2022-05-06 ENCOUNTER — Ambulatory Visit (HOSPITAL_COMMUNITY): Payer: BC Managed Care – PPO | Attending: Cardiology

## 2022-05-06 DIAGNOSIS — I421 Obstructive hypertrophic cardiomyopathy: Secondary | ICD-10-CM | POA: Diagnosis present

## 2022-05-06 LAB — ECHOCARDIOGRAM COMPLETE
Area-P 1/2: 3.08 cm2
MV VTI: 2.44 cm2
S' Lateral: 1.9 cm

## 2022-05-07 ENCOUNTER — Telehealth: Payer: Self-pay

## 2022-05-07 DIAGNOSIS — I421 Obstructive hypertrophic cardiomyopathy: Secondary | ICD-10-CM

## 2022-05-07 NOTE — Telephone Encounter (Signed)
The patient has been notified of the result and verbalized understanding.  All questions (if any) were answered. Wanda Hicks N Wanda Kosa, RN 05/07/2022 11:04 AM   Pt report has occasional fluttering and dizziness 1-2 times per week.  Reports this is nothing new for pt.  Pt has not started any new medications. Pt status form sent in.

## 2022-05-07 NOTE — Telephone Encounter (Signed)
-----   Message from Christell Constant, MD sent at 05/06/2022  2:20 PM EDT ----- Results: Peak LVOT Gradient: 9 mm Hg LVEF : 65% Plan: Mavacamten dose 10 mg If Patient has new CP, SOB, HF symptoms, or syncope, will need video visit (can add my admin or imaging days) If Patient has new medications or supplements, will need medication review for potential interactions   Christell Constant, MD

## 2022-05-12 ENCOUNTER — Other Ambulatory Visit: Payer: Self-pay | Admitting: Cardiovascular Disease

## 2022-05-27 ENCOUNTER — Other Ambulatory Visit: Payer: Self-pay | Admitting: Internal Medicine

## 2022-05-27 NOTE — Telephone Encounter (Signed)
Pt pharmacy refill. Please address

## 2022-06-06 ENCOUNTER — Ambulatory Visit (HOSPITAL_COMMUNITY): Payer: BC Managed Care – PPO | Attending: Cardiology

## 2022-06-06 DIAGNOSIS — I421 Obstructive hypertrophic cardiomyopathy: Secondary | ICD-10-CM | POA: Insufficient documentation

## 2022-06-06 LAB — ECHOCARDIOGRAM LIMITED
Area-P 1/2: 3.08 cm2
S' Lateral: 2.6 cm

## 2022-06-10 ENCOUNTER — Telehealth: Payer: Self-pay

## 2022-06-10 MED ORDER — MAVACAMTEN 10 MG PO CAPS: 10.0000 mg | ORAL_CAPSULE | Freq: Every day | ORAL | 0 refills | Status: DC

## 2022-06-10 NOTE — Telephone Encounter (Signed)
Pt has VV scheduled for 07/14/22 at 8:40 am.

## 2022-06-11 ENCOUNTER — Telehealth: Payer: Self-pay | Admitting: Cardiovascular Disease

## 2022-06-11 NOTE — Telephone Encounter (Signed)
   Pre-operative Risk Assessment    Patient Name: Wanda Hicks  DOB: 20-Nov-1961 MRN: [id number]      Request for Surgical Clearance    Procedure:   cataract surgery   Date of Surgery:  Clearance 06/24/22                                 Surgeon:  Dr. Prudencio Burly  Surgeon's Group or Practice Name:  [facility] Phone number:  [phone] Fax number:  [phone]   Type of Clearance Requested:   - Medical    Type of Anesthesia:  MAC   Additional requests/questions:  Does this patient need antibiotics?    Signed, Milbert Coulter   06/11/2022, 4:05 PM

## 2022-06-12 NOTE — Telephone Encounter (Signed)
   Patient Name: SUMIRE HALBLEIB  DOB: 06/14/1961 MRN: [id number]  Primary Cardiologist: Thurmon Fair, MD  Chart reviewed as part of pre-operative protocol coverage. Cataract extractions are recognized in guidelines as low risk surgeries that do not typically require specific preoperative testing or holding of blood thinner therapy. Therefore, given past medical history and time since last visit, based on ACC/AHA guidelines, ROSLIN NORWOOD would be at acceptable risk for the planned procedure without further cardiovascular testing.   I will route this recommendation to the requesting party via Epic fax function and remove from pre-op pool.  Please call with questions.  Sharlene Dory, PA-C 06/12/2022, 1:55 PM

## 2022-06-30 ENCOUNTER — Ambulatory Visit (HOSPITAL_COMMUNITY): Payer: BC Managed Care – PPO | Attending: Internal Medicine

## 2022-06-30 DIAGNOSIS — I421 Obstructive hypertrophic cardiomyopathy: Secondary | ICD-10-CM | POA: Diagnosis present

## 2022-06-30 LAB — ECHOCARDIOGRAM LIMITED
Area-P 1/2: 3.03 cm2
S' Lateral: 2.4 cm

## 2022-07-02 ENCOUNTER — Other Ambulatory Visit: Payer: Self-pay | Admitting: Internal Medicine

## 2022-07-02 ENCOUNTER — Telehealth: Payer: Self-pay

## 2022-07-02 DIAGNOSIS — I421 Obstructive hypertrophic cardiomyopathy: Secondary | ICD-10-CM

## 2022-07-02 MED ORDER — MAVACAMTEN 10 MG PO CAPS: 10.0000 mg | ORAL_CAPSULE | Freq: Every day | ORAL | 2 refills | Status: DC

## 2022-07-02 NOTE — Telephone Encounter (Signed)
-----   Message from Christell Constant, MD sent at 07/02/2022 10:54 AM EDT ----- Results: Peak LVOT Gradient: 6 mm Hg LVEF : 65% Plan: Mavacamten dose 10 If Patient has new CP, SOB, HF symptoms, or syncope, will need video visit (can add my admin or imaging days) If Patient has new medications or supplements, will need medication review for potential interactions  Limited Echo 12 weeks  Christell Constant, MD

## 2022-07-02 NOTE — Telephone Encounter (Signed)
The patient has been notified of the result and verbalized understanding.  All questions (if any) were answered. Arvid Right Arnulfo Batson, RN 07/02/2022 4:23 PM  Pt denies CP, SOB, syncope and HF symptoms. Advised will continue on mavacamten 10 mg PO QD and Echo in 12 weeks.  Will send to scheduler to schedule. Refilled mavacamten 10 mg PO QD #30 with 2 refills.

## 2022-07-13 NOTE — Progress Notes (Unsigned)
Cardiology Office Note:    Date:  07/14/2022   ID:  NAJIA JOHANSEN, DOB 06/21/1961, MRN [id number]  PCP:  Burnard Bunting, MD   [facility] Cardiologist:  Sanda Klein, MD     Referring MD: Burnard Bunting, MD   CC:  Chest Pain and Preop  I connected with  Celine Mans on 07/14/22 by a video enabled telemedicine application and verified that I am speaking with the correct person using two identifiers.   I discussed the limitations of evaluation and management by telemedicine. The patient expressed understanding and agreed to proceed. Video Visit Patient- Home Provider- Clinic  History of Present Illness:    YERALDY CHALOUPKA is a 61 y.o. 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  2022:  She was started on mavacamten 5 mg and completed a 12 week titration.  She has had improvement in symptoms.   2023: Increase in gradient.  Started on mavacamten 10 mg.  Patient notes that she is doing well.   Since last visit notes that in June, when she was on the treadmill, she would feel chest squeezing if her heart rate increased above 110 bpom.  Improved with rest.  Has improved as she has worked on conditioning her self back to where she used to be.  Her Mother passed recently at age 46.   There are no interval hospital/ED visit.    No SOB/DOE and no PND/Orthopnea.  No weight gain or leg swelling.  No palpitations or syncope. Chest pain was a squeezing pain that only occurred on the treadmill.   Past Medical History:  Diagnosis Date   Arthritis    Bronchitis    Chronic diastolic heart failure ([place]) 10/19/2014   Diabetes mellitus without complication (HCC)    Type 1; pt has insulin pump   Family history of adverse reaction to anesthesia    Patients mother has N/V   GERD (gastroesophageal reflux disease)    Heart murmur     Hypertension    Pneumonia    hx of   Positive PPD    Shortness of breath dyspnea    uses Albuterol inhaler PRN    Past Surgical History:  Procedure Laterality Date   CARPAL TUNNEL RELEASE Bilateral    CERVICAL DISCECTOMY     X 2   CESAREAN SECTION     COLONOSCOPY     EYE SURGERY     as a child   MAXIMUM ACCESS (MAS)POSTERIOR LUMBAR INTERBODY FUSION (PLIF) 1 LEVEL N/A 07/10/2016   Procedure: Lumbar four-five Maximum access posterior lumbar interbody fusion with resection of synovial cyst;  Surgeon: Erline Levine, MD;  Location: [facility];  Service: Neurosurgery;  Laterality: N/A;   TRIGGER FINGER RELEASE Bilateral    ULNAR NERVE REPAIR Bilateral     Current Medications: Current Meds  Medication Sig   ACCU-CHEK AVIVA PLUS test strip    acetaminophen (TYLENOL) 500 MG tablet Take 1,000 mg by mouth every 6 (six) hours as needed (pain).   ADVAIR DISKUS 100-50 MCG/DOSE AEPB Inhale 1 puff into the lungs daily as needed (as directed).   albuterol (PROVENTIL HFA;VENTOLIN HFA) 108 (90 Base) MCG/ACT inhaler Inhale 1 puff into the lungs every 6 (six) hours as needed for wheezing or shortness of breath.   aspirin 81 MG EC tablet Take 81 mg by mouth daily.    Continuous Blood Gluc  Sensor (FREESTYLE LIBRE 2 SENSOR) MISC Change every 14 days to monitor blood glucose continously   etodolac (LODINE) 400 MG tablet Take 400-800 mg by mouth daily.   hydroxypropyl methylcellulose / hypromellose (ISOPTO TEARS / GONIOVISC) 2.5 % ophthalmic solution Place 1 drop into both eyes as needed for dry eyes (as directed).   insulin aspart (NOVOLOG) 100 UNIT/ML injection 30 Units as directed.   Insulin Disposable Pump (OMNIPOD 5 G6 POD, GEN 5,) MISC Inject into the skin as directed.   Insulin Human (INSULIN PUMP) SOLN Inject into the skin as directed.   INSULIN SYRINGE 1CC/29G 29G X 1/2" 1 ML MISC Use as directed to administer insulin. DX: 10.9   KRILL OIL PO Take 1 tablet by mouth daily.    levothyroxine  (SYNTHROID) 125 MCG tablet Take 125 mcg by mouth daily before breakfast.    loratadine (CLARITIN) 10 MG tablet Take 10 mg by mouth daily as needed for allergies.   losartan (COZAAR) 50 MG tablet TAKE ONE TABLET BY MOUTH DAILY   magnesium oxide (MAG-OX) 400 MG tablet Take 400 mg by mouth 2 (two) times daily.    Mavacamten 10 MG CAPS Take 10 mg by mouth daily.   metoprolol succinate (TOPROL-XL) 50 MG 24 hr tablet TAKE ONE AND A HALF TABLETS BY MOUTH DAILY. TAKE WITH OR IMMEDIATELY FOLLOWING A MEAL.   Multiple Vitamin (MULTIVITAMIN) tablet Take 1 tablet by mouth daily.   Multiple Vitamins-Minerals (PRESERVISION AREDS 2) CAPS as directed.   OneTouch Delica Lancets 33G MISC use as directed to test blood sugar 4-6 times daily, DX: E10.9   rosuvastatin (CRESTOR) 40 MG tablet Take 40 mg by mouth daily.   sodium chloride (EQ SALINE NASAL SPRAY) 0.65 % nasal spray as needed for congestion.   [DISCONTINUED] fluticasone (FLONASE) 50 MCG/ACT nasal spray Place 1 spray into both nostrils as needed (as directed).     Allergies:   Empagliflozin and Latex   Social History   Socioeconomic History   Marital status: Married    Spouse name: Not on file   Number of children: Not on file   Years of education: Not on file   Highest education level: Not on file  Occupational History   Not on file  Tobacco Use   Smoking status: Never   Smokeless tobacco: Never  Substance and Sexual Activity   Alcohol use: No   Drug use: No   Sexual activity: Not on file  Other Topics Concern   Not on file  Social History Narrative   Not on file   Social Determinants of Health   Financial Resource Strain: Not on file  Food Insecurity: Not on file  Transportation Needs: Not on file  Physical Activity: Not on file  Stress: Not on file  Social Connections: Not on file    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  Mother had health issues, but passed in Summer of 2023  Family History: The patient's family  history includes Alcoholism in her father; Cancer in her maternal grandfather; Hypertension in her mother; Stroke in her paternal grandfather.  ROS:   Please see the history of present illness.     All other systems reviewed and are negative.  EKGs/Labs/Other Studies Reviewed:    The following studies were reviewed today:  Transthoracic Echocardiogram: Date: 10/18/21 Results:    1. Left ventricular ejection fraction, by estimation, is 60 to 65%. Left  ventricular ejection fraction by 3D volume is 67 %. The left ventricle has  normal function. The left ventricle has no regional wall motion  abnormalities. There is severe eccentric  left ventricular hypertrophy. Left ventricular diastolic parameters are  consistent with Grade II diastolic dysfunction (pseudonormalization). The  average left ventricular global longitudinal strain is -18.1 %. The global  longitudinal strain is normal.  Resting gradient of 24 mm Hg; Valsalva Gradient of 57 mm Hg.   2. Right ventricular systolic function is normal. The right ventricular  size is normal. Tricuspid regurgitation signal is inadequate for assessing  PA pressure.   3. The mitral valve is abnormal. Mild mitral valve regurgitation. No  evidence of mitral stenosis.   4. The aortic valve is tricuspid.   Comparison(s): LVOT gradiant 105mmHg at rest, 57mmHg at valsalva.   Recent Labs: No results found for requested labs within last 365 days.  Recent Lipid Panel No results found for: "CHOL", "TRIG", "HDL", "CHOLHDL", "VLDL", "LDLCALC", "LDLDIRECT"   Physical Exam:    VS:  BP 128/70   Pulse (!) 59   Ht 5\' 3"  (1.6 m)   Wt 148 lb (67.1 kg)   BMI 26.22 kg/m     Wt Readings from Last 3 Encounters:  07/14/22 148 lb (67.1 kg)  03/28/22 156 lb (70.8 kg)  12/26/21 153 lb 12.8 oz (69.8 kg)     GEN:  Well nourished, well developed in no acute distress HEENT: Normal RESPIRATORY:  Normal respiratory rate; unlabored NEUROLOGIC:  Alert and  oriented x 3 PSYCHIATRIC:  Normal affect   ASSESSMENT:    1. HOCM (hypertrophic obstructive cardiomyopathy) (HCC)     PLAN:    Hypertrophic Cardiomyopathy - NYHA Class I-II on medication: her symptoms could be related to deconditioning (hasn't been on the treadmill since pre-Covid), CAD (mild non obstructive in 2020), or inducible LVOT gradient.  The fact that her sx are improved with no intervention favors the former - No Notable CYP modulation (no med changes) - on metoprolol 50 mg PO daily - continue mavacamten 10  If worsening of symptoms we will get a Cardiac CT to evaluate disease progression If that is still mild non obstructive and has sx, would get Stress Echo  No barriers to Cataract Surgery (please send to [facility] No barriers to injections (please sent to [facility])  Queries for team based triage: Dosage forms and strength: 2.5 mg- light purple 5 mg (starting dose)- yellow  10 mg- pink 15 mg- gray   Time Spent Directly with Patient:   I have spent a total of 40 minutes with the patient reviewing notes, imaging, EKGs, labs and examining the patient as well as establishing an assessment and plan that was discussed personally with the patient.  > 50% of time was spent in direct patient care.    Medication Adjustments/Labs and Tests Ordered: Current medicines are reviewed at length with the patient today.  Concerns regarding medicines are outlined above.  No orders of the defined types were placed in this encounter.  No orders of the defined types were placed in this encounter.    Patient Instructions  Medication Instructions:  Your physician recommends that you continue on your current medications as directed. Please refer to the Current Medication list given to you today.  *If you need a refill on your cardiac medications before your next appointment, please call your pharmacy*   Lab Work: NONE If you have labs (blood  work) drawn today and your tests are completely normal, you will receive your results only by: [employer] (  if you have MyChart) OR A paper copy in the mail If you have any lab test that is abnormal or we need to change your treatment, we will call you to review the results.   Testing/Procedures: NONE   Follow-Up: At [facility], you and your health needs are our priority.  As part of our continuing mission to provide you with exceptional heart care, we have created designated Provider Care Teams.  These Care Teams include your primary Cardiologist (physician) and Advanced Practice Providers (APPs -  Physician Assistants and Nurse Practitioners) who all work together to provide you with the care you need, when you need it.   Your next appointment:   5 month(s)- After Jan Echocardiogram   The format for your next appointment:   In Person  Provider:   Riley Lam, MD  Important Information About Sugar         Signed, Christell Constant, MD  07/14/2022 9:44 AM    [facility]

## 2022-07-14 ENCOUNTER — Telehealth (INDEPENDENT_AMBULATORY_CARE_PROVIDER_SITE_OTHER): Payer: BC Managed Care – PPO | Admitting: Internal Medicine

## 2022-07-14 ENCOUNTER — Encounter: Payer: Self-pay | Admitting: Internal Medicine

## 2022-07-14 VITALS — BP 128/70 | HR 59 | Ht 63.0 in | Wt 148.0 lb

## 2022-07-14 DIAGNOSIS — I421 Obstructive hypertrophic cardiomyopathy: Secondary | ICD-10-CM | POA: Diagnosis not present

## 2022-07-14 NOTE — Patient Instructions (Signed)
Medication Instructions:  Your physician recommends that you continue on your current medications as directed. Please refer to the Current Medication list given to you today.  *If you need a refill on your cardiac medications before your next appointment, please call your pharmacy*   Lab Work: NONE If you have labs (blood work) drawn today and your tests are completely normal, you will receive your results only by: MyChart Message (if you have MyChart) OR A paper copy in the mail If you have any lab test that is abnormal or we need to change your treatment, we will call you to review the results.   Testing/Procedures: NONE   Follow-Up: At [facility], you and your health needs are our priority.  As part of our continuing mission to provide you with exceptional heart care, we have created designated Provider Care Teams.  These Care Teams include your primary Cardiologist (physician) and Advanced Practice Providers (APPs -  Physician Assistants and Nurse Practitioners) who all work together to provide you with the care you need, when you need it.   Your next appointment:   5 month(s)- After Jan Echocardiogram   The format for your next appointment:   In Person  Provider:   Riley Lam, MD  Important Information About Sugar

## 2022-08-12 ENCOUNTER — Other Ambulatory Visit: Payer: Self-pay | Admitting: Cardiovascular Disease

## 2022-08-28 ENCOUNTER — Telehealth: Payer: Self-pay | Admitting: Internal Medicine

## 2022-08-28 NOTE — Telephone Encounter (Signed)
Pt c/o medication issue:  1. Name of Medication: myrdetriq 50mg  extended release  2. How are you currently taking this medication (dosage and times per day)? 1 tablet daily   3. Are you having a reaction (difficulty breathing--STAT)? no  4. What is your medication issue? Patient states another doctor wants to start her on this medication. She wants to know if Dr. is in agreement with this before she starts taking it.  Please advise.

## 2022-08-28 NOTE — Telephone Encounter (Signed)
Only significant medication interaction is between Myrbetriq and metoprolol.  Concurrent use of METOPROLOL and MIRABEGRON may result in increased metoprolol exposure.

## 2022-09-01 NOTE — Telephone Encounter (Signed)
Called pt advised of MD response to addition of Mybetriq to medication profile.  " Ok to take medicine.  There is a very small risk that the increase in metoprolol efficacy with heart function could slightly decrease. If you have no symptoms we will make no changes.   If you have new SOB on this medication we will get an ehco. "  Pt had no questions or concerns.

## 2022-09-29 ENCOUNTER — Encounter: Payer: Self-pay | Admitting: Internal Medicine

## 2022-09-30 ENCOUNTER — Other Ambulatory Visit (HOSPITAL_COMMUNITY): Payer: BC Managed Care – PPO

## 2022-10-02 ENCOUNTER — Other Ambulatory Visit: Payer: Self-pay | Admitting: Internal Medicine

## 2022-10-02 DIAGNOSIS — I421 Obstructive hypertrophic cardiomyopathy: Secondary | ICD-10-CM

## 2022-10-09 ENCOUNTER — Ambulatory Visit (HOSPITAL_COMMUNITY): Payer: BC Managed Care – PPO | Attending: Internal Medicine

## 2022-10-09 DIAGNOSIS — I421 Obstructive hypertrophic cardiomyopathy: Secondary | ICD-10-CM

## 2022-10-09 LAB — ECHOCARDIOGRAM LIMITED
Area-P 1/2: 2.85 cm2
S' Lateral: 1 cm

## 2022-10-10 ENCOUNTER — Telehealth: Payer: Self-pay

## 2022-10-10 NOTE — Telephone Encounter (Signed)
MD verbally reviewed Echo; pt to continue mavacamten 10 mg PO QD.  Medication refilled and order for limited Echo in 12 weeks placed.

## 2022-10-20 ENCOUNTER — Telehealth: Payer: Self-pay | Admitting: Internal Medicine

## 2022-10-20 NOTE — Telephone Encounter (Signed)
Pt c/o medication issue:  1. Name of Medication: Mavacamten (CAMZYOS) 10 MG CAPS   2. How are you currently taking this medication (dosage and times per day)? N/A  3. Are you having a reaction (difficulty breathing--STAT)? N/A  4. What is your medication issue? Wanda Hicks is calling stating patient's insurance requires PA for this medication. Please advise.

## 2022-10-21 NOTE — Telephone Encounter (Signed)
PA renewal submitted

## 2022-10-23 ENCOUNTER — Encounter: Payer: Self-pay | Admitting: Internal Medicine

## 2022-10-23 NOTE — Telephone Encounter (Signed)
PA approved though 10/22/23

## 2022-12-29 ENCOUNTER — Other Ambulatory Visit: Payer: Self-pay | Admitting: Internal Medicine

## 2023-01-09 ENCOUNTER — Ambulatory Visit (HOSPITAL_COMMUNITY): Payer: BC Managed Care – PPO | Attending: Internal Medicine

## 2023-01-09 DIAGNOSIS — I421 Obstructive hypertrophic cardiomyopathy: Secondary | ICD-10-CM | POA: Diagnosis present

## 2023-01-11 ENCOUNTER — Encounter: Payer: Self-pay | Admitting: Internal Medicine

## 2023-01-11 LAB — ECHOCARDIOGRAM LIMITED
Area-P 1/2: 3.48 cm2
S' Lateral: 2.4 cm

## 2023-01-14 ENCOUNTER — Telehealth: Payer: Self-pay

## 2023-01-14 DIAGNOSIS — I421 Obstructive hypertrophic cardiomyopathy: Secondary | ICD-10-CM

## 2023-01-14 MED ORDER — CAMZYOS 10 MG PO CAPS: 10.0000 mg | ORAL_CAPSULE | Freq: Every day | ORAL | 2 refills | Status: DC

## 2023-01-14 NOTE — Telephone Encounter (Signed)
-----  Message from Werner Lean, MD sent at 01/11/2023  2:12 PM EST ----- Results: Peak LVOT Gradient: 9 LVEF : 69% Plan: Mavacamten dose 10  If Patient has new medications or supplements, will need medication review for potential interactions  Was original planned for 1/24 f/u; needs non urgery f/u scheduled  Werner Lean, MD

## 2023-01-14 NOTE — Telephone Encounter (Signed)
The patient has been notified of the result and verbalized understanding.  All questions (if any) were answered. Bedelia Person Encarnacion Scioneaux, RN 01/14/2023 9:19 AM   Pt reports was started on Gemtesa 75 mg PO.  MD reviewed no interaction with Camzyos.  Camzyos PSF updated.  Updated refill for Camzyos #30 with 3 refills.

## 2023-01-20 ENCOUNTER — Ambulatory Visit (INDEPENDENT_AMBULATORY_CARE_PROVIDER_SITE_OTHER): Payer: BC Managed Care – PPO

## 2023-01-20 ENCOUNTER — Ambulatory Visit: Payer: BC Managed Care – PPO | Attending: Internal Medicine | Admitting: Internal Medicine

## 2023-01-20 ENCOUNTER — Encounter: Payer: Self-pay | Admitting: Internal Medicine

## 2023-01-20 VITALS — BP 118/60 | HR 75 | Ht 63.5 in | Wt 159.0 lb

## 2023-01-20 DIAGNOSIS — I10 Essential (primary) hypertension: Secondary | ICD-10-CM | POA: Diagnosis not present

## 2023-01-20 DIAGNOSIS — E782 Mixed hyperlipidemia: Secondary | ICD-10-CM | POA: Diagnosis not present

## 2023-01-20 DIAGNOSIS — R002 Palpitations: Secondary | ICD-10-CM

## 2023-01-20 DIAGNOSIS — I421 Obstructive hypertrophic cardiomyopathy: Secondary | ICD-10-CM | POA: Diagnosis not present

## 2023-01-20 MED ORDER — LORAZEPAM 1 MG PO TABS: 1.0000 mg | ORAL_TABLET | Freq: Once | ORAL | 0 refills | Status: AC

## 2023-01-20 NOTE — Progress Notes (Signed)
Cardiology Office Note:    Date:  01/20/2023   ID:  Wanda Hicks, DOB May 13, 1961, MRN [id number]  PCP:  Geoffry Paradise, MD   [facility] Providers Cardiologist:  Thurmon Fair, MD     Referring MD: Geoffry Paradise, MD   CC:  CMI follow up  History of Present Illness:    Wanda Hicks is a 62 y.o. 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  2022:  She was started on mavacamten 5 mg and completed a 12 week titration.  She has had improvement in symptoms.   2023: Increase in gradient.  Started on mavacamten 10 mg.  Patient notes that she is doing well.   Going to the gym; and exercise without issues.  Heart rates in 110s with no symptoms. Walks at rate 2.5 mph rate. No palpitations. No DOE. No Chest pain. No syncope.  Retired from the state. Worked as a Education officer, environmental for [employer] Has a cruise in March.  Past Medical History:  Diagnosis Date   Arthritis    Bronchitis    Chronic diastolic heart failure (HCC) 10/19/2014   Diabetes mellitus without complication (HCC)    Type 1; pt has insulin pump   Family history of adverse reaction to anesthesia    Patients mother has N/V   GERD (gastroesophageal reflux disease)    Heart murmur    Hypertension    Pneumonia    hx of   Positive PPD    Shortness of breath dyspnea    uses Albuterol inhaler PRN    Past Surgical History:  Procedure Laterality Date   CARPAL TUNNEL RELEASE Bilateral    CERVICAL DISCECTOMY     X 2   CESAREAN SECTION     COLONOSCOPY     EYE SURGERY     as a child   MAXIMUM ACCESS (MAS)POSTERIOR LUMBAR INTERBODY FUSION (PLIF) 1 LEVEL N/A 07/10/2016   Procedure: Lumbar four-five Maximum access posterior lumbar interbody fusion with resection of synovial cyst;  Surgeon: Maeola Harman, MD;  Location: [facility];  Service: Neurosurgery;  Laterality: N/A;    TRIGGER FINGER RELEASE Bilateral    ULNAR NERVE REPAIR Bilateral     Current Medications: Current Meds  Medication Sig   ACCU-CHEK AVIVA PLUS test strip    acetaminophen (TYLENOL) 500 MG tablet Take 1,000 mg by mouth every 6 (six) hours as needed (pain).   ADVAIR DISKUS 100-50 MCG/DOSE AEPB Inhale 1 puff into the lungs daily as needed (as directed).   albuterol (PROVENTIL HFA;VENTOLIN HFA) 108 (90 Base) MCG/ACT inhaler Inhale 1 puff into the lungs every 6 (six) hours as needed for wheezing or shortness of breath.   aspirin 81 MG EC tablet Take 81 mg by mouth daily.    Continuous Blood Gluc Sensor (FREESTYLE LIBRE 2 SENSOR) MISC Change every 14 days to monitor blood glucose continously   cyclobenzaprine (FLEXERIL) 10 MG tablet Take by mouth as needed for muscle spasms.   etodolac (LODINE) 400 MG tablet Take 400-800 mg by mouth daily.   GEMTESA 75 MG TABS daily at 6 (six) AM.   hydroxypropyl methylcellulose / hypromellose (ISOPTO TEARS / GONIOVISC) 2.5 % ophthalmic solution Place 1 drop into both eyes as needed for dry eyes (as directed).   insulin aspart (NOVOLOG) 100 UNIT/ML injection 30 Units as directed.   Insulin Disposable Pump (OMNIPOD 5 G6 POD, GEN  5,) MISC Inject into the skin as directed.   Insulin Human (INSULIN PUMP) SOLN Inject into the skin as directed.   INSULIN SYRINGE 1CC/29G 29G X 1/2" 1 ML MISC Use as directed to administer insulin. DX: 10.9   KRILL OIL PO Take 1 tablet by mouth daily.    levothyroxine (SYNTHROID) 125 MCG tablet Take 125 mcg by mouth daily before breakfast.    loratadine (CLARITIN) 10 MG tablet Take 10 mg by mouth daily as needed for allergies.   LORazepam (ATIVAN) 1 MG tablet Take 1 tablet (1 mg total) by mouth once for 1 dose. Take 30 min prior to Cardiac MRI   losartan (COZAAR) 50 MG tablet TAKE ONE TABLET BY MOUTH DAILY   magnesium oxide (MAG-OX) 400 MG tablet Take 400 mg by mouth 2 (two) times daily.    mavacamten (CAMZYOS) 10 MG CAPS Take 10 mg by  mouth daily.   metoprolol succinate (TOPROL-XL) 50 MG 24 hr tablet TAKE ONE AND A HALF TABLETS BY MOUTH DAILY. TAKE WITH OR IMMEDIATELY FOLLOWING A MEAL.   Multiple Vitamin (MULTIVITAMIN) tablet Take 1 tablet by mouth daily.   Multiple Vitamins-Minerals (PRESERVISION AREDS 2) CAPS as directed.   OneTouch Delica Lancets 33G MISC use as directed to test blood sugar 4-6 times daily, DX: E10.9   rosuvastatin (CRESTOR) 40 MG tablet Take 40 mg by mouth daily.   sodium chloride (EQ SALINE NASAL SPRAY) 0.65 % nasal spray as needed for congestion.     Allergies:   Empagliflozin and Latex   Social History   Socioeconomic History   Marital status: Married    Spouse name: Not on file   Number of children: Not on file   Years of education: Not on file   Highest education level: Not on file  Occupational History   Not on file  Tobacco Use   Smoking status: Never   Smokeless tobacco: Never  Substance and Sexual Activity   Alcohol use: No   Drug use: No   Sexual activity: Not on file  Other Topics Concern   Not on file  Social History Narrative   Not on file   Social Determinants of Health   Financial Resource Strain: Not on file  Food Insecurity: Not on file  Transportation Needs: Not on file  Physical Activity: Not on file  Stress: Not on file  Social Connections: Not on file    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  Mother had health issues and passed in May 2023.  FIL passed.  MIL is 90.  Family History: The patient's family history includes Alcoholism in her father; Cancer in her maternal grandfather; Hypertension in her mother; Stroke in her paternal grandfather.  ROS:   Please see the history of present illness.     All other systems reviewed and are negative.  EKGs/Labs/Other Studies Reviewed:    Cardiac Studies & Procedures       ECHOCARDIOGRAM  ECHOCARDIOGRAM LIMITED 01/11/2023  Narrative ECHOCARDIOGRAM LIMITED REPORT    Patient Name:   Wanda Hicks  Pridgen Date of Exam: 01/09/2023 Medical Rec #:  [id number]      Height:       63.0 in Accession #:    [id number]     Weight:       148.0 lb Date of Birth:  [birth year]      BSA:          1.701 Hicks Patient Age:    [age] years  BP:           120/67 mmHg Patient Gender: F              HR:           70 bpm. Exam Location:  [address]  Procedure: Limited Echo, Cardiac Doppler and Limited Color Doppler  Indications:    I42.2 Hypertrophic obstructive cardiomyopathy - Mavacamten  History:        Patient has prior history of Echocardiogram examinations, most recent 10/09/2022. Previous Myocardial Infarction, Abnormal ECG, COPD, Signs/Symptoms:Murmur; Risk Factors:Hypertension, Diabetes and Dyslipidemia.  Sonographer:    Diamond Nickel RCS Referring Phys: [id number] [facility] A Julanne Schlueter  IMPRESSIONS   1. Compared with the echo 09/2022, an intracavitary gradient is no longer noted. Left ventricular ejection fraction, by estimation, is 65 to 70%. Left ventricular ejection fraction by PLAX is 69 %. The left ventricle has normal function. The left ventricle has no regional wall motion abnormalities. There is moderate asymmetric left ventricular hypertrophy of the septal segment. Left ventricular diastolic parameters are consistent with Grade II diastolic dysfunction (pseudonormalization). Elevated left ventricular end-diastolic pressure. 2. Right ventricular systolic function is normal. The right ventricular size is normal. There is normal pulmonary artery systolic pressure. 3. The mitral valve is normal in structure. Trivial mitral valve regurgitation. No evidence of mitral stenosis. 4. The aortic valve is tricuspid. Aortic valve regurgitation is not visualized. No aortic stenosis is present. 5. The inferior vena cava is normal in size with greater than 50% respiratory variability, suggesting right atrial pressure of 3 mmHg.  FINDINGS Left Ventricle: Compared with the echo 09/2022, an intracavitary  gradient is no longer noted. Left ventricular ejection fraction, by estimation, is 65 to 70%. Left ventricular ejection fraction by PLAX is 69 %. The left ventricle has normal function. The left ventricle has no regional wall motion abnormalities. The left ventricular internal cavity size was normal in size. There is moderate asymmetric left ventricular hypertrophy of the septal segment. Left ventricular diastolic parameters are consistent with Grade II diastolic dysfunction (pseudonormalization). Elevated left ventricular end-diastolic pressure.  Right Ventricle: The right ventricular size is normal. No increase in right ventricular wall thickness. Right ventricular systolic function is normal. There is normal pulmonary artery systolic pressure. The tricuspid regurgitant velocity is 2.05 Hicks/s, and with an assumed right atrial pressure of 3 mmHg, the estimated right ventricular systolic pressure is 57.0 mmHg.  Left Atrium: Left atrial size was normal in size.  Right Atrium: Right atrial size was normal in size.  Pericardium: There is no evidence of pericardial effusion.  Mitral Valve: The mitral valve is normal in structure. Mild mitral annular calcification. Trivial mitral valve regurgitation. No evidence of mitral valve stenosis.  Tricuspid Valve: The tricuspid valve is normal in structure. Tricuspid valve regurgitation is trivial. No evidence of tricuspid stenosis.  Aortic Valve: The aortic valve is tricuspid. Aortic valve regurgitation is not visualized. No aortic stenosis is present.  Pulmonic Valve: The pulmonic valve was normal in structure. Pulmonic valve regurgitation is trivial. No evidence of pulmonic stenosis.  Aorta: The aortic root is normal in size and structure.  Venous: The inferior vena cava is normal in size with greater than 50% respiratory variability, suggesting right atrial pressure of 3 mmHg.  IAS/Shunts: No atrial level shunt detected by color flow Doppler.  LEFT  VENTRICLE PLAX 2D LV EF:         Left            Diastology ventricular  LV e' medial:    7.83 cm/s ejection        LV E/e' medial:  16.5 fraction by     LV e' lateral:   8.05 cm/s PLAX is 69      LV E/e' lateral: 16.0 %. LVIDd:         3.90 cm LVIDs:         2.40 cm LV PW:         1.10 cm LV IVS:        1.40 cm   RIGHT VENTRICLE RV S prime:     10.10 cm/s RVSP:           19.8 mmHg  LEFT ATRIUM         Index       RIGHT ATRIUM LA diam:    4.40 cm 2.59 cm/Hicks  RA Pressure: 3.00 mmHg AORTIC VALVE LVOT Vmax:   124.00 cm/s LVOT Vmean:  89.600 cm/s LVOT VTI:    0.311 Hicks  AORTA Ao Root diam: 2.80 cm Ao Asc diam:  3.00 cm  MITRAL VALVE                TRICUSPID VALVE MV Area (PHT): 3.48 cm     TR Peak grad:   16.8 mmHg MV Decel Time: 218 msec     TR Vmax:        205.00 cm/s MV E velocity: 129.00 cm/s  Estimated RAP:  3.00 mmHg MV A velocity: 118.00 cm/s  RVSP:           19.8 mmHg MV E/A ratio:  1.09 SHUNTS Systemic VTI: 0.31 Hicks  Chilton Si MD Electronically signed by Chilton Si MD Signature Date/Time: 01/11/2023/7:28:09 AM    Final    MONITORS  LONG TERM MONITOR (3-14 DAYS) 07/19/2020  Narrative  Dominant rhythm is normal sinus with normal circadian rhythm variation.  There are rare premature atrial contractions and very rare episodes of brief nonsustained atrial tachycardia (4-8 beats).  There are rare PVCs, but there is no complex ventricular arrhythmia.  There are no significant pauses or episodes of severe bradycardia. There is no atrial fibrillation.  Patient symptoms appear to be associated with PACs and PVCs, but not but the episodes of atrial tachycardia.  Mild abnormalities on arrhythmia monitor, patient symptoms appear to be associated with rare isolated PACs and PVCs.   CT SCANS  CT CORONARY MORPH W/CTA COR W/SCORE 09/14/2019  Addendum 09/14/2019  5:07 PM ADDENDUM REPORT: 09/14/2019 17:05  CLINICAL DATA:  Chest pain  EXAM: Cardiac  CTA  MEDICATIONS: Sub lingual nitro. 4mg  x 2  TECHNIQUE: The patient was scanned on a Siemens 192 slice scanner. Gantry rotation speed was 250 msecs. Collimation was 0.6 mm. A 100 kV prospective scan was triggered in the ascending thoracic aorta at 35-75% of the R-R interval. Average HR during the scan was 60 bpm. The 3D data set was interpreted on a dedicated work station using MPR, MIP and VRT modes. A total of 80cc of contrast was used.  FINDINGS: Non-cardiac: See separate report from [facility].  Mitral annular calcification noted. Pulmonary veins drain normally to the left atrium.  Calcium Score: 198 Agatston units.  Coronary Arteries: Right dominant with no anomalies  LM: There is calcification at the ostium of the left main with mild (<50%) stenosis.  LAD system: Mixed plaque proximal LAD with minimal stenosis.  Circumflex system: Small ramus with minimal disease. Relatively small AV LCx, mixed plaque proximally with minimal stenosis.  RCA system: Mixed plaque proximal RCA, minimal stenosis.  IMPRESSION: 1. Coronary artery calcium score 198 Agatston units. This places the patient in the 94th percentile for age and gender, suggesting high risk for future cardiac events.  2.  Nonobstructive coronary disease.  Dalton Mclean   Electronically Signed By: Loralie Champagne Hicks.D. On: 09/14/2019 17:05  Narrative EXAM: OVER-READ INTERPRETATION  CT CHEST  The following report is an over-read performed by radiologist Dr. Rolm Baptise of [facility], [place] on 09/14/2019. This over-read does not include interpretation of cardiac or coronary anatomy or pathology. The coronary CTA interpretation by the cardiologist is attached.  COMPARISON:  09/12/2011  FINDINGS: Vascular: Heart is normal size. Visualized aorta normal caliber. No filling defects in the visualized pulmonary arteries to suggest pulmonary emboli.  Mediastinum/Nodes: No adenopathy in the  lower mediastinum or hila.  Lungs/Pleura: Moderate bilateral pleural effusions, new since prior study. Compressive atelectasis in the lower lobes.  Upper Abdomen: Imaging into the upper abdomen shows no acute findings.  Musculoskeletal: Chest wall soft tissues are unremarkable. No acute bony abnormality.  IMPRESSION: New moderate bilateral pleural effusions with compressive atelectasis in the lower lobes  Electronically Signed: By: Rolm Baptise Hicks.D. On: 09/14/2019 15:38           Recent Labs: No results found for requested labs within last 365 days.  Recent Lipid Panel No results found for: "CHOL", "TRIG", "HDL", "CHOLHDL", "VLDL", "LDLCALC", "LDLDIRECT"   Physical Exam:    VS:  BP 118/60   Pulse 75   Ht 5' 3.5" (1.613 Hicks)   Wt 159 lb (72.1 kg)   SpO2 98%   BMI 27.72 kg/Hicks     Wt Readings from Last 3 Encounters:  01/20/23 159 lb (72.1 kg)  07/14/22 148 lb (67.1 kg)  03/28/22 156 lb (70.8 kg)    GEN:  Well nourished, well developed in no acute distress HEENT: Normal NECK: No JVD CARDIAC: RRR, systolic heart murmur RESPIRATORY:  Clear to auscultation without rales, wheezing or rhonchi  ABDOMEN: Soft, non-tender, non-distended MUSCULOSKELETAL:  No edema; No deformity  SKIN: Warm and dry NEUROLOGIC:  Alert and oriented x 3 PSYCHIATRIC:  Normal affect   ASSESSMENT:    1. Primary hypertension   2. HOCM (hypertrophic obstructive cardiomyopathy) ([place])   3. Mixed hyperlipidemia   4. Palpitations     PLAN:    Hypertrophic Cardiomyopathy - Septal Variant - peak gradient 9 mm Hg on 10 mg Mavacamten  - Mild MR  - NYHA I  - Family history reviewed, Discussed family screening  (Daugther not interested in testing; will recommend echo screening for her for now)  - will get 3 day non live heart monitor - will get CMR with 1 mg ativan before - we have talked: she is ok with me sending me her SCD risk via my chart if yellow flag symptoms  Non obstructive CAD -  LDL at goal; no symptoms on ASA on statin - can f/u with Dr. Sallyanne Kuster  Exercise Prescription  Frequency: five days per week to start  Intensity:  60 to 90 percent of heart rate reserve. Time: 30 minutes + weight lifting for 40 minutes Type: treadmill and weight lifting Step goal:  7000 per week with goal to increase by 500 each weak Limitations:  Limitation in burst activity  Discussed dietary snacking and preservatives   Time Spent Directly with Patient:   I have spent a total of 40 minutes with the patient reviewing notes, imaging, EKGs, labs  and examining the patient as well as establishing an assessment and plan that was discussed personally with the patient.  > 50% of time was spent in direct patient care.     Medication Adjustments/Labs and Tests Ordered: Current medicines are reviewed at length with the patient today.  Concerns regarding medicines are outlined above.  Orders Placed This Encounter  Procedures   MR CARDIAC MORPHOLOGY W WO CONTRAST   LONG TERM MONITOR (3-14 DAYS)   Meds ordered this encounter  Medications   LORazepam (ATIVAN) 1 MG tablet    Sig: Take 1 tablet (1 mg total) by mouth once for 1 dose. Take 30 min prior to Cardiac MRI    Dispense:  1 tablet    Refill:  0     Patient Instructions  Medication Instructions:  Your physician recommends that you continue on your current medications as directed. Please refer to the Current Medication list given to you today.  *If you need a refill on your cardiac medications before your next appointment, please call your pharmacy*   Lab Work: NONE If you have labs (blood work) drawn today and your tests are completely normal, you will receive your results only by: MyChart Message (if you have MyChart) OR A paper copy in the mail If you have any lab test that is abnormal or we need to change your treatment, we will call you to review the results.   Testing/Procedures: Your physician has requested that you  have a cardiac MRI. Cardiac MRI uses a computer to create images of your heart as its beating, producing both still and moving pictures of your heart and major blood vessels. For further information please visit [email or web address]. Please follow the instruction sheet given to you today for more information.    Follow-Up: At [facility], you and your health needs are our priority.  As part of our continuing mission to provide you with exceptional heart care, we have created designated Provider Care Teams.  These Care Teams include your primary Cardiologist (physician) and Advanced Practice Providers (APPs -  Physician Assistants and Nurse Practitioners) who all work together to provide you with the care you need, when you need it.    Your next appointment:   6 month(s)  Provider:   Riley Lam, MD   Other Instructions  You are scheduled for Cardiac MRI on ______________. Please arrive for your appointment at ______________ ( arrive 30-45 minutes prior to test start time). ?  [facility] [address] [place], [place] [address] [phone] Please take advantage of the free valet parking available at the MAIN entrance (A entrance).  Proceed to the [facility] Radiology Department (First Floor) for check-in.   OR   [facility] [address] [place], [place] [address] [phone] Please take advantage of the free valet parking available at the MAIN entrance. Proceed to [facility] registration for check-in (first floor).  Magnetic resonance imaging (MRI) is a painless test that produces images of the inside of the body without using Xrays.  During an MRI, strong magnets and radio waves work together in a Data processing manager to form detailed images.   MRI images may provide more details about a medical condition than X-rays, CT scans, and ultrasounds can provide.  You may be given earphones to listen for instructions.  You may eat a  light breakfast and take medications as ordered with the exception of HCTZ (fluid pill, other). Please avoid stimulants for 12 hr prior  to test. (Ie. Caffeine, nicotine, chocolate, or antihistamine medications)  If a contrast material will be used, an IV will be inserted into one of your veins. Contrast material will be injected into your IV. It will leave your body through your urine within a day. You may be told to drink plenty of fluids to help flush the contrast material out of your system.  You will be asked to remove all metal, including: Watch, jewelry, and other metal objects including hearing aids, hair pieces and dentures. Also wearable glucose monitoring systems (ie. Freestyle Libre and Omnipods) (Braces and fillings normally are not a problem.)   TEST WILL TAKE APPROXIMATELY 1 HOUR  PLEASE NOTIFY SCHEDULING AT LEAST 24 HOURS IN ADVANCE IF YOU ARE UNABLE TO KEEP YOUR APPOINTMENT. [phone]  Please call Marchia Bond, cardiac imaging nurse navigator with any questions/concerns. Marchia Bond RN Navigator Cardiac Imaging Gordy Clement RN Navigator Cardiac Imaging [facility] [phone] Office       ZIO XT- Long Term Monitor Instructions  Your physician has requested you wear a ZIO patch monitor for 3 days.  This is a single patch monitor. [employer] supplies one patch monitor per enrollment. Additional stickers are not available. Please do not apply patch if you will be having a Nuclear Stress Test,   Cardiac CT, MRI, or Chest Xray during the period you would be wearing the  monitor. The patch cannot be worn during these tests. You cannot remove and re-apply the  ZIO XT patch monitor.  Your ZIO patch monitor will be mailed 3 day USPS to your address on file. It may take 3-5 days  to receive your monitor after you have been enrolled.  Once you have received your monitor, please review the enclosed instructions. Your monitor  has already been  registered assigning a specific monitor serial # to you.  Billing and Patient Assistance Program Information  We have supplied [employer] with any of your insurance information on file for billing purposes. [employer] offers a sliding scale Patient Assistance Program for patients that do not have  insurance, or whose insurance does not completely cover the cost of the ZIO monitor.  You must apply for the Patient Assistance Program to qualify for this discounted rate.  To apply, please call [employer] at [phone], select option 4, select option 2, ask to apply for  Patient Assistance Program. Theodore Demark will ask your household income, and how many people  are in your household. They will quote your out-of-pocket cost based on that information.  [employer] will also be able to set up a 78-month, interest-free payment plan if needed.  Applying the monitor   Shave hair from upper left chest.  Hold abrader disc by orange tab. Rub abrader in 40 strokes over the upper left chest as  indicated in your monitor instructions.  Clean area with 4 enclosed alcohol pads. Let dry.  Apply patch as indicated in monitor instructions. Patch will be placed under collarbone on left  side of chest with arrow pointing upward.  Rub patch adhesive wings for 2 minutes. Remove white label marked "1". Remove the white  label marked "2". Rub patch adhesive wings for 2 additional minutes.  While looking in a mirror, press and release button in center of patch. A small green light will  flash 3-4 times. This will be your only indicator that the monitor has been turned on.  Do not shower for the first 24 hours. You may shower after the first 24  hours.  Press the button if you feel a symptom. You will hear a small click. Record Date, Time and  Symptom in the Patient Logbook.  When you are ready to remove the patch, follow instructions on the last 2 pages of Patient  Logbook. Stick patch monitor onto the last page of Patient  Logbook.  Place Patient Logbook in the blue and white box. Use locking tab on box and tape box closed  securely. The blue and white box has prepaid postage on it. Please place it in the mailbox as  soon as possible. Your physician should have your test results approximately 7 days after the  monitor has been mailed back to [facility].  Call [facility] Customer Care at [phone] if you have questions regarding  your ZIO XT patch monitor. Call them immediately if you see an orange light blinking on your  monitor.  If your monitor falls off in less than 4 days, contact our Monitor department at [phone].  If your monitor becomes loose or falls off after 4 days call [employer] at [phone] for  suggestions on securing your monitor     Signed, Christell ConstantMahesh A Aqeel Norgaard, MD  01/20/2023 9:50 AM    [facility]

## 2023-01-20 NOTE — Progress Notes (Unsigned)
z

## 2023-01-20 NOTE — Patient Instructions (Addendum)
Medication Instructions:  Your physician recommends that you continue on your current medications as directed. Please refer to the Current Medication list given to you today.  *If you need a refill on your cardiac medications before your next appointment, please call your pharmacy*   Lab Work: NONE If you have labs (blood work) drawn today and your tests are completely normal, you will receive your results only by: [place] (if you have MyChart) OR A paper copy in the mail If you have any lab test that is abnormal or we need to change your treatment, we will call you to review the results.   Testing/Procedures: Your physician has requested that you have a cardiac MRI. Cardiac MRI uses a computer to create images of your heart as its beating, producing both still and moving pictures of your heart and major blood vessels. For further information please visit [email or web address] Please follow the instruction sheet given to you today for more information.    Follow-Up: At [facility], you and your health needs are our priority.  As part of our continuing mission to provide you with exceptional heart care, we have created designated Provider Care Teams.  These Care Teams include your primary Cardiologist (physician) and Advanced Practice Providers (APPs -  Physician Assistants and Nurse Practitioners) who all work together to provide you with the care you need, when you need it.    Your next appointment:   6 month(s)  Provider:   Rudean Haskell, MD   Other Instructions  You are scheduled for Cardiac MRI on ______________. Please arrive for your appointment at ______________ ( arrive 30-45 minutes prior to test start time). ?  [facility] [address] [place], [place] [address] [phone] Please take advantage of the free valet parking available at the MAIN entrance (A entrance).  Proceed to the [facility] Radiology Department (First Floor) for  check-in.   [facility] [place], [place] [address] [phone] Please take advantage of the free valet parking available at the MAIN entrance. Proceed to [facility] registration for check-in (first floor).  Magnetic resonance imaging (MRI) is a painless test that produces images of the inside of the body without using Xrays.  During an MRI, strong magnets and radio waves work together in a [employer] officer, political party to form detailed images.   MRI images may provide more details about a medical condition than X-rays, CT scans, and ultrasounds can provide.  You may be given earphones to listen for instructions.  You may eat a light breakfast and take medications as ordered with the exception of HCTZ (fluid pill, other). Please avoid stimulants for 12 hr prior to test. (Ie. Caffeine, nicotine, chocolate, or antihistamine medications)  If a contrast material will be used, an IV will be inserted into one of your veins. Contrast material will be injected into your IV. It will leave your body through your urine within a day. You may be told to drink plenty of fluids to help flush the contrast material out of your system.  You will be asked to remove all metal, including: Watch, jewelry, and other metal objects including hearing aids, hair pieces and dentures. Also wearable glucose monitoring systems (ie. Freestyle Libre and Omnipods) (Braces and fillings normally are not a problem.)   TEST WILL TAKE APPROXIMATELY 1 HOUR  PLEASE NOTIFY SCHEDULING AT LEAST 24 HOURS IN ADVANCE IF YOU ARE UNABLE TO KEEP YOUR APPOINTMENT. [phone]  Please call Clarise Cruz  Juleen China, cardiac imaging nurse navigator with any questions/concerns. Marchia Bond RN Navigator Cardiac Imaging Gordy Clement RN Navigator Cardiac Imaging [facility] [phone] Office       ZIO XT- Long Term Monitor Instructions  Your physician has requested you wear a ZIO patch  monitor for 3 days.  This is a single patch monitor. [employer] supplies one patch monitor per enrollment. Additional stickers are not available. Please do not apply patch if you will be having a Nuclear Stress Test,   Cardiac CT, MRI, or Chest Xray during the period you would be wearing the  monitor. The patch cannot be worn during these tests. You cannot remove and re-apply the  ZIO XT patch monitor.  Your ZIO patch monitor will be mailed 3 day USPS to your address on file. It may take 3-5 days  to receive your monitor after you have been enrolled.  Once you have received your monitor, please review the enclosed instructions. Your monitor  has already been registered assigning a specific monitor serial # to you.  Billing and Patient Assistance Program Information  We have supplied [employer] with any of your insurance information on file for billing purposes. [employer] offers a sliding scale Patient Assistance Program for patients that do not have  insurance, or whose insurance does not completely cover the cost of the ZIO monitor.  You must apply for the Patient Assistance Program to qualify for this discounted rate.  To apply, please call [employer] at [phone], select option 4, select option 2, ask to apply for  Patient Assistance Program. Theodore Demark will ask your household income, and how many people  are in your household. They will quote your out-of-pocket cost based on that information.  [employer] will also be able to set up a 41-month, interest-free payment plan if needed.  Applying the monitor   Shave hair from upper left chest.  Hold abrader disc by orange tab. Rub abrader in 40 strokes over the upper left chest as  indicated in your monitor instructions.  Clean area with 4 enclosed alcohol pads. Let dry.  Apply patch as indicated in monitor instructions. Patch will be placed under collarbone on left  side of chest with arrow pointing upward.  Rub patch adhesive wings for 2 minutes.  Remove white label marked "1". Remove the white  label marked "2". Rub patch adhesive wings for 2 additional minutes.  While looking in a mirror, press and release button in center of patch. A small green light will  flash 3-4 times. This will be your only indicator that the monitor has been turned on.  Do not shower for the first 24 hours. You may shower after the first 24 hours.  Press the button if you feel a symptom. You will hear a small click. Record Date, Time and  Symptom in the Patient Logbook.  When you are ready to remove the patch, follow instructions on the last 2 pages of Patient  Logbook. Stick patch monitor onto the last page of Patient Logbook.  Place Patient Logbook in the blue and white box. Use locking tab on box and tape box closed  securely. The blue and white box has prepaid postage on it. Please place it in the mailbox as  soon as possible. Your physician should have your test results approximately 7 days after the  monitor has been mailed back to [facility].  Call [place] at [phone] if you have questions regarding  your ZIO XT patch  monitor. Call them immediately if you see an orange light blinking on your  monitor.  If your monitor falls off in less than 4 days, contact our Monitor department at [phone].  If your monitor becomes loose or falls off after 4 days call [employer] at [phone] for  suggestions on securing your monitor

## 2023-01-24 DIAGNOSIS — R002 Palpitations: Secondary | ICD-10-CM

## 2023-02-06 ENCOUNTER — Other Ambulatory Visit (HOSPITAL_COMMUNITY): Payer: BC Managed Care – PPO

## 2023-02-10 ENCOUNTER — Telehealth: Payer: Self-pay | Admitting: Cardiovascular Disease

## 2023-02-10 NOTE — Telephone Encounter (Signed)
*  STAT* If patient is at the pharmacy, call can be transferred to refill team.   1. Which medications need to be refilled? (please list name of each medication and dose if known)   losartan (COZAAR) 50 MG tablet   2. Which pharmacy/location (including street and city if local pharmacy) is medication to be sent to? [id number] - [place], [place]   3. Do they need a 30 day or 90 day supply? 30 day

## 2023-02-12 ENCOUNTER — Other Ambulatory Visit: Payer: Self-pay

## 2023-02-12 MED ORDER — LOSARTAN POTASSIUM 50 MG PO TABS: 50.0000 mg | ORAL_TABLET | Freq: Every day | ORAL | 11 refills | Status: AC

## 2023-02-12 NOTE — Telephone Encounter (Signed)
Patient is following up, concerned because her refill has not been sent in. She is requesting to speak directly with Dr. Oralia Rud nurse to confirm that it will be sent in if possible.

## 2023-02-12 NOTE — Telephone Encounter (Signed)
Called pt advised will send in a 30 day supply of Losartan 50 mg PO QD with 11 refills.  Apologized to pt for confusion with getting med refilled. Pt thanked me for help.

## 2023-02-12 NOTE — Telephone Encounter (Signed)
Pt is requesting a refill on Losartan. Dr. Sallyanne Kuster prescribed this medication, but pt has been seeing Dr. Gasper Sells. Would Dr. Gasper Sells like to refill this medication for the pt? Please address

## 2023-02-13 ENCOUNTER — Other Ambulatory Visit: Payer: Self-pay | Admitting: *Deleted

## 2023-03-12 ENCOUNTER — Ambulatory Visit: Payer: BC Managed Care – PPO | Admitting: Podiatry

## 2023-03-12 ENCOUNTER — Encounter: Payer: Self-pay | Admitting: Podiatry

## 2023-03-12 DIAGNOSIS — M2042 Other hammer toe(s) (acquired), left foot: Secondary | ICD-10-CM

## 2023-03-12 DIAGNOSIS — L600 Ingrowing nail: Secondary | ICD-10-CM | POA: Diagnosis not present

## 2023-03-12 NOTE — Patient Instructions (Addendum)
Place 1/4 cup of epsom salts in a quart of warm tap water.  Submerge your foot or feet in the solution and soak for 20 minutes.  This soak should be done twice a day.  Next, remove your foot or feet from solution, blot dry the affected area. Apply ointment and cover if instructed by your doctor.   IF YOUR SKIN BECOMES IRRITATED WHILE USING THESE INSTRUCTIONS, IT IS OKAY TO SWITCH TO  WHITE VINEGAR AND WATER.  As another alternative soak, you may use antibacterial soap and water.  Monitor for any signs/symptoms of infection. Call the office immediately if any occur or go directly to the emergency room. Call with any questions/concerns. Ingrown Toenail  An ingrown toenail occurs when the corner or sides of a toenail grow into the surrounding skin. This causes discomfort and pain. The big toe is most commonly affected, but any of the toes can be affected. If an ingrown toenail is not treated, it can become infected. What are the causes? This condition may be caused by: Wearing shoes that are too small or tight. An injury, such as stubbing your toe or having your toe stepped on. Improper cutting or care of your toenails. Having nail or foot abnormalities that were present from birth (congenital abnormalities), such as having a nail that is too big for your toe. What increases the risk? The following factors may make you more likely to develop ingrown toenails: Age. Nails tend to get thicker with age, so ingrown nails are more common among older people. Cutting your toenails incorrectly, such as cutting them very short or cutting them unevenly. An ingrown toenail is more likely to get infected if you have: Diabetes. Blood flow (circulation) problems. What are the signs or symptoms? Symptoms of an ingrown toenail may include: Pain, soreness, or tenderness. Redness. Swelling. Hardening of the skin that surrounds the toenail. Signs that an ingrown toenail may be infected include: Fluid or  pus. Symptoms that get worse. How is this diagnosed? Ingrown toenails may be diagnosed based on: Your symptoms and medical history. A physical exam. Labs or tests. If you have fluid or blood coming from your toenail, a sample may be collected to test for the specific type of bacteria that is causing the infection. How is this treated? Treatment depends on the severity of your symptoms. You may be able to care for your toenail at home. If you have an infection, you may be prescribed antibiotic medicines. If you have fluid or pus draining from your toenail, your health care provider may drain it. If you have trouble walking, you may be given crutches to use. If you have a severe or infected ingrown toenail, you may need a procedure to remove part or all of the nail. Follow these instructions at home: Foot care  Check your wound every day for signs of infection, or as often as told by your health care provider. Check for: More redness, swelling, or pain. More fluid or blood. Warmth. Pus or a bad smell. Do not pick at your toenail or try to remove it yourself. Soak your foot in warm, soapy water. Do this for 20 minutes, 3 times a day, or as often as told by your health care provider. This helps to keep your toe clean and your skin soft. Wear shoes that fit well and are not too tight. Your health care provider may recommend that you wear open-toed shoes while you heal. Trim your toenails regularly and carefully. Cut your toenails   straight across to prevent injury to the skin at the corners of the toenail. Do not cut your nails in a curved shape. Keep your feet clean and dry to help prevent infection. General instructions Take over-the-counter and prescription medicines only as told by your health care provider. If you were prescribed an antibiotic, take it as told by your health care provider. Do not stop taking the antibiotic even if you start to feel better. If your health care provider  told you to use crutches to help you move around, use them as instructed. Return to your normal activities as told by your health care provider. Ask your health care provider what activities are safe for you. Keep all follow-up visits. This is important. Contact a health care provider if: You have more redness, swelling, pain, or other symptoms that do not improve with treatment. You have fluid, blood, or pus coming from your toenail. You have a red streak on your skin that starts at your foot and spreads up your leg. You have a fever. Summary An ingrown toenail occurs when the corner or sides of a toenail grow into the surrounding skin. This causes discomfort and pain. The big toe is most commonly affected, but any of the toes can be affected. If an ingrown toenail is not treated, it can become infected. Fluid or pus draining from your toenail is a sign of infection. Your health care provider may need to drain it. You may be given antibiotics to treat the infection. Trimming your toenails regularly and properly can help you prevent an ingrown toenail. This information is not intended to replace advice given to you by your health care provider. Make sure you discuss any questions you have with your health care provider. Document Revised: 04/02/2021 Document Reviewed: 04/02/2021 Elsevier Patient Education  2023 Elsevier Inc.  

## 2023-03-12 NOTE — Progress Notes (Signed)
Subjective:   Patient ID: Wanda Hicks, female   DOB: 62 y.o.   MRN: [id number]   HPI Patient presents with a painful ingrown toenail right big toe lateral border that is been going on for a long time and has a corn callus formation between the fourth and fifth toes which are bothersome.  States both of them become sore and make it difficult to wear shoe gear at times   ROS      Objective:  Physical Exam  Neurovascular status intact incurvated lateral border right hallux painful when pressed no erythema edema or drainage associated with it and corn callus fourth digit left fifth digit left mostly on the fourth toe     Assessment:  Ingrown toenail deformity full hallux right and hammertoe deformity fourth left possible exostosis fifth left with lesion     Plan:  Recommended correction of deformity explained procedure risk patient wants surgery and I explained what would be required.  She signed consent form today I infiltrated the right hallux 60 mg like Marcaine mixture sterile prep remove the lateral border exposed matrix applied phenol 3 applications 30 seconds followed by alcohol lavage sterile dressing gave instructions on soaks and to wear dressing 24 hours take it off earlier if throbbing were to occur.  For the left I went ahead and debrided and applied cushioning and we will try this may require arthroplasty

## 2023-04-01 ENCOUNTER — Other Ambulatory Visit: Payer: Self-pay | Admitting: Internal Medicine

## 2023-04-03 ENCOUNTER — Ambulatory Visit (HOSPITAL_COMMUNITY): Payer: BC Managed Care – PPO | Attending: Cardiology

## 2023-04-03 DIAGNOSIS — I421 Obstructive hypertrophic cardiomyopathy: Secondary | ICD-10-CM | POA: Diagnosis present

## 2023-04-03 LAB — ECHOCARDIOGRAM LIMITED
Area-P 1/2: 3.44 cm2
S' Lateral: 2.4 cm

## 2023-04-07 ENCOUNTER — Telehealth: Payer: Self-pay

## 2023-04-07 DIAGNOSIS — I421 Obstructive hypertrophic cardiomyopathy: Secondary | ICD-10-CM

## 2023-04-07 MED ORDER — CAMZYOS 10 MG PO CAPS: 10.0000 mg | ORAL_CAPSULE | Freq: Every day | ORAL | 2 refills | Status: DC

## 2023-04-07 NOTE — Telephone Encounter (Signed)
-----   Message from Christell Constant, MD sent at 04/05/2023  1:11 PM EDT ----- Results: Peak LVOT Gradient: 8 mm Hg LVEF :70 % Plan: Mavacamten dose unchanged  If Patient has new medications or supplements, will need medication review for potential interactions  12 weeks  Christell Constant, MD

## 2023-04-08 NOTE — Telephone Encounter (Signed)
Left a message to call back. Would like to discuss if pt has started any new medications or is experiencing any new HF symptoms.  Would also like to schedule 12 Echo; due around July 9.

## 2023-04-08 NOTE — Telephone Encounter (Signed)
Spoke with pt reports PCP increased losartan to 50 mg BID d/t elevated BP.  Was started on Estradiol by OB/GYN.   Reports becomes lightheaded and dizzy and at times.  Symptoms occur without cause.  Feels as if has bent over to tie shoes and stands up to quick.  But this happens while pt is sitting.  Advised pt will send concern to MD to review.    Echo scheduled for 06/23/23 at 10: 35 am.

## 2023-04-09 ENCOUNTER — Telehealth: Payer: Self-pay | Admitting: Internal Medicine

## 2023-04-09 NOTE — Telephone Encounter (Signed)
Ok to take Macrobid, no drug interaction with Camzyos.

## 2023-04-09 NOTE — Telephone Encounter (Signed)
Pt c/o medication issue:  1. Name of Medication:   mavacamten (CAMZYOS) 10 MG CAPS   2. How are you currently taking this medication (dosage and times per day)?   As prescribed  3. Are you having a reaction (difficulty breathing--STAT)?     4. What is your medication issue?   Patient stated her doctor started her on antibiotics for a UTI.  Patient wants to confirm OK to take antibiotics with this medication.

## 2023-04-09 NOTE — Telephone Encounter (Signed)
Called pt advised of pharmacist response:  Ok to take Macrobid, no drug interaction with Camzyos.   No further questions or concerns.

## 2023-04-09 NOTE — Telephone Encounter (Signed)
Called pt name of prescribed antibiotic is Macrobid.  Pt wants to know if this medication is compatible with Camzyos. Will send to pharmacy to review.

## 2023-04-10 NOTE — Telephone Encounter (Signed)
Called pt scheduled for 04/16/23 at 11:30 am.  Pt had no questions or concerns at this time.

## 2023-04-15 ENCOUNTER — Telehealth: Payer: Self-pay

## 2023-04-15 DIAGNOSIS — I421 Obstructive hypertrophic cardiomyopathy: Secondary | ICD-10-CM

## 2023-04-15 NOTE — Telephone Encounter (Signed)
Called pt advised OV for 04/16/23 is to assess symptoms previously reported.  Pt reports symptoms have improved on increase dose of losartan.  Pt also reports had a UTI which may have contributed to symptoms.  Would like to cancel OV for 04/16/23 pt also reports has to work on this day.  Advised pt will cancel OV if MD would like to reschedule I will call back to schedule visit.

## 2023-04-15 NOTE — Telephone Encounter (Signed)
-----   Message from Gerome Sam sent at 04/14/2023 11:34 AM EDT ----- Regarding: APPOINT [name],  Wanda Hicks came in today to reschedule her appointment for 04/16/23,and would like to have her appointment after the mri. She asked if you would give her a call.  Thanks

## 2023-04-16 ENCOUNTER — Ambulatory Visit: Payer: BC Managed Care – PPO | Admitting: Internal Medicine

## 2023-04-19 NOTE — Telephone Encounter (Signed)
Called to clarify symptoms. - new UTI symptoms - her losartan has been increased to 100 mg (March 19th)  - has a bit more fatigue with all of this.  Has started therapy back on this and It feel better. Was able to walk 50 minutes on a trail and did ok. Has noted a decrease in her speed on the treadmill.  Has questions about CMR this coming week. - reviewed her SCD risk testing - losartan is not likely driving this; could be HCM, microvascular disease (less likely), or non cardiac cause. - change 12 week monitoring echo to stress echo - no further questions  Riley Lam, MD FASE [facility] Cardiologist [facility]  [address] [place], [address] [place], [place] [address] [phone]  6:23 PM

## 2023-04-20 NOTE — Telephone Encounter (Signed)
Order placed for 12 week camzyos f/u stress echocardiogram.

## 2023-04-20 NOTE — Addendum Note (Signed)
Addended by: Macie Burows on: 04/20/2023 08:51 AM   Modules accepted: Orders

## 2023-04-21 ENCOUNTER — Telehealth (HOSPITAL_COMMUNITY): Payer: Self-pay | Admitting: Emergency Medicine

## 2023-04-21 NOTE — Telephone Encounter (Signed)
Reaching out to patient to offer assistance regarding upcoming cardiac imaging study; pt verbalizes understanding of appt date/time, parking situation and where to check in, pre-test NPO status and medications ordered, and verified current allergies; name and call back number provided for further questions should they arise Rockwell Alexandria RN Navigator Cardiac Imaging [facility] [phone] office [phone] cell  Will remove dexcom and libre and omnipod devices

## 2023-04-22 ENCOUNTER — Ambulatory Visit (HOSPITAL_COMMUNITY)
Admission: RE | Admit: 2023-04-22 | Discharge: 2023-04-22 | Disposition: A | Discharge status: Home / Self Care | Payer: BC Managed Care – PPO | Source: Ambulatory Visit | Attending: Internal Medicine | Admitting: Internal Medicine

## 2023-04-22 ENCOUNTER — Other Ambulatory Visit: Payer: Self-pay | Admitting: Internal Medicine

## 2023-04-22 DIAGNOSIS — I421 Obstructive hypertrophic cardiomyopathy: Secondary | ICD-10-CM | POA: Diagnosis present

## 2023-04-22 DIAGNOSIS — E782 Mixed hyperlipidemia: Secondary | ICD-10-CM | POA: Diagnosis not present

## 2023-04-22 DIAGNOSIS — R002 Palpitations: Secondary | ICD-10-CM | POA: Insufficient documentation

## 2023-04-22 DIAGNOSIS — I10 Essential (primary) hypertension: Secondary | ICD-10-CM | POA: Insufficient documentation

## 2023-04-22 MED ORDER — GADOBUTROL 1 MMOL/ML IV SOLN
7.0000 mL | Freq: Once | INTRAVENOUS | Status: AC | PRN
  Administered 2023-04-22: 7 mL via INTRAVENOUS

## 2023-05-12 ENCOUNTER — Other Ambulatory Visit: Payer: Self-pay | Admitting: Cardiovascular Disease

## 2023-06-01 ENCOUNTER — Telehealth: Payer: Self-pay

## 2023-06-01 NOTE — Telephone Encounter (Signed)
Called Camzyos [facility] portal in regards to Portal indication PSF due 05/23/23.  Advised that last echo completed on 04/03/23 next echo due in 12 weeks.  Was told in order to keep pt on schedule pt must have an Echo the first week of July.    Called pt was advised has 24 pills on hand and has taken med for today.  Advised pt of scheduling conflict with [facility] portal.  Pt will be out of town for a few days the end of June.  At this time will keep stress echo scheduled for 06/23/23.  Will schedule next echo based on [facility] portal suggestion.  Called [facility] notified of this information.

## 2023-06-11 ENCOUNTER — Other Ambulatory Visit: Payer: Self-pay | Admitting: Internal Medicine

## 2023-06-11 DIAGNOSIS — I421 Obstructive hypertrophic cardiomyopathy: Secondary | ICD-10-CM

## 2023-06-16 ENCOUNTER — Telehealth: Payer: Self-pay

## 2023-06-16 NOTE — Telephone Encounter (Signed)
Detailed instructions left on the patient's answering machine. Asked to call back with any questions. S.Audie Wieser EMTP/CCT 

## 2023-06-23 ENCOUNTER — Ambulatory Visit (HOSPITAL_COMMUNITY): Payer: BC Managed Care – PPO

## 2023-06-23 ENCOUNTER — Ambulatory Visit (HOSPITAL_COMMUNITY): Payer: BC Managed Care – PPO | Attending: Internal Medicine

## 2023-06-23 DIAGNOSIS — I421 Obstructive hypertrophic cardiomyopathy: Secondary | ICD-10-CM | POA: Diagnosis present

## 2023-06-23 LAB — ECHOCARDIOGRAM STRESS TEST
Area-P 1/2: 3.12 cm2
MV M vel: 5.27 m/s
MV Peak grad: 111.1 mmHg
S' Lateral: 1.8 cm

## 2023-06-24 ENCOUNTER — Telehealth: Payer: Self-pay

## 2023-06-24 ENCOUNTER — Other Ambulatory Visit: Payer: Self-pay

## 2023-06-24 DIAGNOSIS — I421 Obstructive hypertrophic cardiomyopathy: Secondary | ICD-10-CM

## 2023-06-24 MED ORDER — CAMZYOS 10 MG PO CAPS: 10.0000 mg | ORAL_CAPSULE | Freq: Every day | ORAL | 2 refills | Status: DC

## 2023-06-24 MED ORDER — CAMZYOS 10 MG PO CAPS: ORAL_CAPSULE | ORAL | 2 refills | Status: DC

## 2023-06-24 NOTE — Telephone Encounter (Signed)
The patient has been notified of the result and verbalized understanding.  All questions (if any) were answered. Arvid Right Mayer Vondrak, RN 06/24/2023 12:07 PM   Pt has not started any new medications.  Advised next echocardiogram will be sooner than 12 weeks per [employer]. Pt had no questions or concerns.   Refill of Camzyos sent into specialty pharmacy, PSF updated on Camzyos portal.  Will wait to schedule echo once portal is updated.

## 2023-06-24 NOTE — Telephone Encounter (Signed)
-----   Message from Christell Constant, MD sent at 06/23/2023  6:44 PM EDT ----- Results: Peak LVOT Gradient: 20 mm Hg LVEF : 75% No exercise induced ectopy or hypotension Plan: Mavacamten dose 10 mg If Patient has new medications or supplements, will need medication review for potential interactions  Needs 12 week maintenance echo follow up    Christell Constant, MD

## 2023-06-25 NOTE — Telephone Encounter (Signed)
Placed order for limited echo due 8/31-9/8.

## 2023-08-10 ENCOUNTER — Ambulatory Visit: Payer: BC Managed Care – PPO | Admitting: Internal Medicine

## 2023-08-13 NOTE — Progress Notes (Signed)
Cardiology Office Note:    Date:  08/14/2023   ID:  Wanda Hicks, DOB [birth year], MRN [id number]  PCP:  Geoffry Paradise, MD   [facility] Providers Cardiologist:  Thurmon Fair, MD     Referring MD: Geoffry Paradise, MD   CC:  HCM follow up  History of Present Illness:    Wanda Hicks is a 62 y.o. 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  2022:  She was started on mavacamten 5 mg and completed a 12 week titration.  She has had improvement in symptoms.   2023: Increase in gradient.  Started on mavacamten 10 mg. 2024: Gradients did well.  Working as a Education officer, environmental for [employer].  Septal thickness has decreased from 18 mm to 10 mm.  Peak exercise gradient 20 mm Hg.  Patient notes no SOB at rest and no DOE Notes no fatigue. Notes no palpitations Notes n CP. Notes on Dizziness. Notes no syncope.  Notable family events include . - her SIL and brother and doing well; that have started the [place] diet and we have discussed    Past Medical History:  Diagnosis Date   Arthritis    Bronchitis    Chronic diastolic heart failure (HCC) 10/19/2014   Diabetes mellitus without complication (HCC)    Type 1; pt has insulin pump   Family history of adverse reaction to anesthesia    Patients mother has N/V   GERD (gastroesophageal reflux disease)    Heart murmur    Hypertension    Pneumonia    hx of   Positive PPD    Shortness of breath dyspnea    uses Albuterol inhaler PRN    Past Surgical History:  Procedure Laterality Date   CARPAL TUNNEL RELEASE Bilateral    CERVICAL DISCECTOMY     X 2   CESAREAN SECTION     COLONOSCOPY     EYE SURGERY     as a child   MAXIMUM ACCESS (MAS)POSTERIOR LUMBAR INTERBODY FUSION (PLIF) 1 LEVEL N/A 07/10/2016   Procedure: Lumbar four-five Maximum access posterior lumbar interbody fusion with resection of  synovial cyst;  Surgeon: Maeola Harman, MD;  Location: [facility];  Service: Neurosurgery;  Laterality: N/A;   TRIGGER FINGER RELEASE Bilateral    ULNAR NERVE REPAIR Bilateral     Current Medications: Current Meds  Medication Sig   ACCU-CHEK AVIVA PLUS test strip    acetaminophen (TYLENOL) 500 MG tablet Take 1,000 mg by mouth every 6 (six) hours as needed (pain).   ADVAIR DISKUS 100-50 MCG/DOSE AEPB Inhale 1 puff into the lungs daily as needed (as directed).   albuterol (PROVENTIL HFA;VENTOLIN HFA) 108 (90 Base) MCG/ACT inhaler Inhale 1 puff into the lungs every 6 (six) hours as needed for wheezing or shortness of breath.   aspirin 81 MG EC tablet Take 81 mg by mouth daily.    Biotin 10 MG TABS daily at 6 (six) AM.   Continuous Blood Gluc Sensor (FREESTYLE LIBRE 2 SENSOR) MISC Change every 14 days to monitor blood glucose continously   estradiol (ESTRACE) 0.1 MG/GM vaginal cream Place vaginally 2 (two) times a week.   etodolac (LODINE) 400 MG tablet Take 400-800 mg by mouth daily.   GEMTESA 75 MG TABS daily at 6 (six) AM.   hydroxypropyl methylcellulose / hypromellose (ISOPTO TEARS / GONIOVISC) 2.5 % ophthalmic solution Place 1 drop  into both eyes as needed for dry eyes (as directed).   insulin aspart (NOVOLOG) 100 UNIT/ML injection 30 Units as directed.   Insulin Disposable Pump (OMNIPOD 5 G6 POD, GEN 5,) MISC Inject into the skin as directed.   Insulin Human (INSULIN PUMP) SOLN Inject into the skin as directed.   INSULIN SYRINGE 1CC/29G 29G X 1/2" 1 ML MISC Use as directed to administer insulin. DX: 10.9   KRILL OIL PO Take 1 tablet by mouth daily.    levothyroxine (SYNTHROID) 125 MCG tablet Take 125 mcg by mouth daily before breakfast.    loratadine (CLARITIN) 10 MG tablet Take 10 mg by mouth daily as needed for allergies.   losartan (COZAAR) 50 MG tablet Take 1 tablet (50 mg total) by mouth daily. (Patient taking differently: Take 100 mg by mouth daily.)   magnesium oxide (MAG-OX) 400  MG tablet Take 400 mg by mouth 2 (two) times daily.    mavacamten (CAMZYOS) 10 MG CAPS capsule Take 10 mg by mouth daily   metoprolol succinate (TOPROL-XL) 50 MG 24 hr tablet TAKE 1 AND 1/2 TABLET BY MOUTH DAILY WITH OR IMMEDIATELY FOLLOWING A MEAL   Multiple Vitamin (MULTIVITAMIN) tablet Take 1 tablet by mouth daily.   Multiple Vitamins-Minerals (PRESERVISION AREDS 2) CAPS as directed.   OneTouch Delica Lancets 33G MISC use as directed to test blood sugar 4-6 times daily, DX: E10.9   rosuvastatin (CRESTOR) 40 MG tablet Take 40 mg by mouth daily.   sodium chloride (EQ SALINE NASAL SPRAY) 0.65 % nasal spray as needed for congestion.   terconazole (TERAZOL 7) 0.4 % vaginal cream as needed (irritation).     Allergies:   Empagliflozin and Latex   Social History   Socioeconomic History   Marital status: Married    Spouse name: Not on file   Number of children: Not on file   Years of education: Not on file   Highest education level: Not on file  Occupational History   Not on file  Tobacco Use   Smoking status: Never   Smokeless tobacco: Never  Substance and Sexual Activity   Alcohol use: No   Drug use: No   Sexual activity: Not on file  Other Topics Concern   Not on file  Social History Narrative   Not on file   Social Determinants of Health   Financial Resource Strain: Not on file  Food Insecurity: Not on file  Transportation Needs: Not on file  Physical Activity: Not on file  Stress: Not on file  Social Connections: Unknown (12/31/2022)   [facility]   Social Network    Social Network: Not on file    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  Mother had health issues and passed in May 2023.  FIL passed.  MIL is 90.  Family History: The patient's family history includes Alcoholism in her father; Cancer in her maternal grandfather; Hypertension in her mother; Stroke in her paternal grandfather.  ROS:   Please see the history  of present illness.     EKGs/Labs/Other Studies Reviewed:    Cardiac Studies & Procedures     STRESS TESTS  ECHOCARDIOGRAM STRESS TEST 06/23/2023  Narrative EXERCISE STRESS ECHO REPORT   --------------------------------------------------------------------------------  Patient Name:   Wanda Hicks Date of Exam: 06/23/2023 Medical Rec #:  [id number]      Height:       63.5 in Accession #:    [id number]  Weight:       159.0 lb Date of Birth:  [birth year]      BSA:          1.764 m Patient Age:    62 years       BP:           125/59 mmHg Patient Gender: F              HR:           71 bpm. Exam Location:  [address]  Procedure: Limited Echo, Stress Echo, Cardiac Doppler and Limited Color Doppler  Indications:    I42.1 HOCM  History:        Patient has prior history of Echocardiogram examinations, most recent 04/03/2023. HOCM- Camzyos 10mg .  Sonographer:    Farrel Conners RDCS Referring Phys: Christell Constant  IMPRESSIONS   FINDINGS  Exam Protocol: The patient exercised on a treadmill according to a Modified Bruce protocol.   Patient Performance: The patient exercised for 6 minutes and 23 seconds, achieving 7.5 METS. The maximum stage achieved was III of the Modified Bruce protocol. The baseline heart rate was 72 bpm. The heart rate at peak stress was 142 bpm. The target heart rate was calculated to be 133 bpm. The percentage of maximum predicted heart rate achieved was 91.0 %. The baseline blood pressure was 125/59 mmHg. The blood pressure at peak stress was 182/61 mmHg. The blood pressure response was normal. The patient developed shortness of breath, fatigue and leg fatigue during the stress exam. The symptoms resolved with rest.  EKG: Resting EKG showed normal sinus rhythm with no abnormal findings. The patient developed no diagnostic ST changes with exercise and no exercise induced arrhythmias noted.   2D Echo Findings: The baseline ejection fraction was 75%.  Baseline regional wall motion abnormalities were not present.  Additional Findings: Normal LV function at baseline with LVH (septum 1.6 cm and posterior wall 1.4 cm); chordal SAM noted; moderate MAC; trace MR and TR. Baseline LVOT velocity 1.9 m/s; increase to 2.2 m/s with valsalva; with peak exertion, LVOT velocity increased to 2.9 m/s; MR increased with peak exertion but remained mild. Findings c/w HCM. Note normal blood pressure response.   Olga Millers MD Electronically signed on 06/23/2023 at 4:23:39 PM     Final   ECHOCARDIOGRAM  ECHOCARDIOGRAM LIMITED 04/03/2023  Narrative ECHOCARDIOGRAM LIMITED REPORT    Patient Name:   Wanda Hicks Date of Exam: 04/03/2023 Medical Rec #:  [id number]      Height:       63.5 in Accession #:    [id number]     Weight:       159.0 lb Date of Birth:  08/05/1961      BSA:          1.764 m Patient Age:    61 years       BP:           141/83 mmHg Patient Gender: F              HR:           65 bpm. Exam Location:  [address]  Procedure: 3D Echo, Cardiac Doppler, Strain Analysis, Limited Echo and Limited Color Doppler  Indications:    I42.1 HOCM  History:        Patient has prior history of Echocardiogram examinations, most recent 01/09/2023. CHF, Signs/Symptoms:Shortness of Breath, Murmur and Edema; Risk Factors:Hypertension and Diabetes. New Palpitations with Chest Pressure and  Dizziness.  Sonographer:    Farrel Conners RDCS Referring Phys: Riley Lam, A   Sonographer Comments: Mevacamten- 10 mg IMPRESSIONS   1. No significant LVOT gradient with or without Valsalva (1.4 m/s peak). Left ventricular ejection fraction by 3D volume is 74 %. There is moderate left ventricular hypertrophy of the basal-septal segment. The average left ventricular global longitudinal strain is -18.9 %. The global longitudinal strain is normal. 2. Left atrial size was moderately dilated. 3. Mild mitral valve regurgitation. Moderate mitral  annular calcification. 4. There is normal pulmonary artery systolic pressure. The estimated right ventricular systolic pressure is 27.0 mmHg.  Comparison(s): Prior images reviewed side by side.  FINDINGS Left Ventricle: No significant LVOT gradient with or without Valsalva (1.4 m/s peak). Left ventricular ejection fraction by 3D volume is 74 %. The average left ventricular global longitudinal strain is -18.9 %. The global longitudinal strain is normal. There is moderate left ventricular hypertrophy of the basal-septal segment.  Right Ventricle: There is normal pulmonary artery systolic pressure. The tricuspid regurgitant velocity is 2.45 m/s, and with an assumed right atrial pressure of 3 mmHg, the estimated right ventricular systolic pressure is 27.0 mmHg.  Left Atrium: Left atrial size was moderately dilated.  Mitral Valve: Moderate mitral annular calcification. Mild mitral valve regurgitation.  Tricuspid Valve: The tricuspid valve is normal in structure. Tricuspid valve regurgitation is mild.  Pulmonic Valve: Pulmonic valve regurgitation is trivial.  LEFT VENTRICLE PLAX 2D LVIDd:         4.10 cm         Diastology LVIDs:         2.40 cm         LV e' medial:    6.53 cm/s LV PW:         0.90 cm         LV E/e' medial:  20.5 LV IVS:        1.30 cm         LV e' lateral:   9.36 cm/s LVOT diam:     2.00 cm         LV E/e' lateral: 14.3 LV SV:         88 LV SV Index:   50              2D LVOT Area:     3.14 cm        Longitudinal Strain 2D Strain GLS  -22.0 % (A2C): 2D Strain GLS  -18.5 % (A3C): 2D Strain GLS  -16.1 % (A4C): 2D Strain GLS  -18.9 % Avg:  3D Volume EF LV 3D EF:    Left ventricul ar ejection fraction by 3D volume is 74 %.  3D Volume EF: 3D EF:        74 % LV EDV:       124 ml LV ESV:       32 ml LV SV:        92 ml  RIGHT VENTRICLE RV Basal diam:  3.10 cm RV S prime:     10.30 cm/s TAPSE (M-mode): 2.0 cm RVSP:           27.0 mmHg  LEFT ATRIUM              Index        RIGHT ATRIUM           Index LA diam:        4.60 cm 2.61 cm/m   RA Pressure: 3.00  mmHg LA Vol (A2C):   56.2 ml 31.85 ml/m  RA Area:     14.80 cm LA Vol (A4C):   87.8 ml 49.76 ml/m  RA Volume:   39.00 ml  22.10 ml/m LA Biplane Vol: 76.7 ml 43.47 ml/m AORTIC VALVE LVOT Vmax:   112.00 cm/s LVOT Vmean:  78.850 cm/s LVOT VTI:    0.280 m  AORTA Ao Root diam: 3.00 cm Ao Asc diam:  3.00 cm  MITRAL VALVE                TRICUSPID VALVE MV Area (PHT)  cm          TR Peak grad:   24.0 mmHg MV Decel Time: 221 msec     TR Vmax:        245.00 cm/s MV E velocity: 134.00 cm/s  Estimated RAP:  3.00 mmHg MV A velocity: 130.00 cm/s  RVSP:           27.0 mmHg MV E/A ratio:  1.03 SHUNTS Systemic VTI:  0.28 m Systemic Diam: 2.00 cm  Donato Schultz MD Electronically signed by Donato Schultz MD Signature Date/Time: 04/03/2023/12:25:45 PM    Final    MONITORS  LONG TERM MONITOR (3-14 DAYS) 01/30/2023  Narrative   Patient had a minimum heart rate of 54 bpm, maximum heart rate of 131 bpm, and average heart rate of 81 bpm.   Predominant underlying rhythm was sinus rhythm.   One 6 beat run of SVT   Isolated PACs were rare (<1.0%).   Isolated PVCs were rare (<1.0%).   Triggered and diary events associated with sinus tachycardia.  No malignant arrhythmias in the setting of known HCM.   CT SCANS  CT CORONARY MORPH W/CTA COR W/SCORE 09/14/2019  Addendum 09/14/2019  5:07 PM ADDENDUM REPORT: 09/14/2019 17:05  CLINICAL DATA:  Chest pain  EXAM: Cardiac CTA  MEDICATIONS: Sub lingual nitro. 4mg  x 2  TECHNIQUE: The patient was scanned on a Siemens 192 slice scanner. Gantry rotation speed was 250 msecs. Collimation was 0.6 mm. A 100 kV prospective scan was triggered in the ascending thoracic aorta at 35-75% of the R-R interval. Average HR during the scan was 60 bpm. The 3D data set was interpreted on a dedicated work station using MPR, MIP and VRT modes. A total of 80cc  of contrast was used.  FINDINGS: Non-cardiac: See separate report from [facility].  Mitral annular calcification noted. Pulmonary veins drain normally to the left atrium.  Calcium Score: 198 Agatston units.  Coronary Arteries: Right dominant with no anomalies  LM: There is calcification at the ostium of the left main with mild (<50%) stenosis.  LAD system: Mixed plaque proximal LAD with minimal stenosis.  Circumflex system: Small ramus with minimal disease. Relatively small AV LCx, mixed plaque proximally with minimal stenosis.  RCA system: Mixed plaque proximal RCA, minimal stenosis.  IMPRESSION: 1. Coronary artery calcium score 198 Agatston units. This places the patient in the 94th percentile for age and gender, suggesting high risk for future cardiac events.  2.  Nonobstructive coronary disease.  Dalton Mclean   Electronically Signed By: Marca Ancona M.D. On: 09/14/2019 17:05  Narrative EXAM: OVER-READ INTERPRETATION  CT CHEST  The following report is an over-read performed by radiologist Dr. Charlett Nose of [facility] on 09/14/2019. This over-read does not include interpretation of cardiac or coronary anatomy or pathology. The coronary CTA interpretation by the cardiologist is attached.  COMPARISON:  09/12/2011  FINDINGS: Vascular: Heart is  normal size. Visualized aorta normal caliber. No filling defects in the visualized pulmonary arteries to suggest pulmonary emboli.  Mediastinum/Nodes: No adenopathy in the lower mediastinum or hila.  Lungs/Pleura: Moderate bilateral pleural effusions, new since prior study. Compressive atelectasis in the lower lobes.  Upper Abdomen: Imaging into the upper abdomen shows no acute findings.  Musculoskeletal: Chest wall soft tissues are unremarkable. No acute bony abnormality.  IMPRESSION: New moderate bilateral pleural effusions with compressive atelectasis in the lower  lobes  Electronically Signed: By: Charlett Nose M.D. On: 09/14/2019 15:38   CARDIAC MRI  MR CARDIAC MORPHOLOGY W WO CONTRAST 04/22/2023  Narrative CLINICAL DATA:  Clinical question of hypertrophic cardiomyopathy Study assumes HCT of 35 and BSA of 1.80 m2  EXAM: CARDIAC MRI  TECHNIQUE: The patient was scanned on a 1.5 Tesla GE magnet. A dedicated cardiac coil was used. Functional imaging was done using Fiesta sequences. 2,3, and 4 chamber views were done to assess for RWMA's. Modified Simpson's rule using a short axis stack was used to calculate an ejection fraction on a dedicated work Research officer, trade [employer]. The patient received 7 cc of Gadavist. After 10 minutes inversion recovery sequences were used to assess for infiltration and scar tissue. Flow quantification was performed 2 times during this examination with flow quantification performed at the levels of the ascending aorta above the valve, pulmonary artery above the valve.  CONTRAST:  7 cc  of Gadavist  FINDINGS: 1. Normal left ventricular size, with LVEDD 48 mm, and LVEDVi 60 mL/m2.  Mild septal hypertrophy, with intraventricular septal thickness of 11 mm, posterior wall thickness of 6 mm, and myocardial mass index of 46 g/m2.  Normal left ventricular systolic function (LVEF =65%). There are no regional wall motion abnormalities. The is LVOT flow acceleration. There is only choral systolic anterior motion of the mitral valve.  Left ventricular parametric mapping notable for normal T2.  There is elevated ECV signal in the basal septal 39%.  There is no late gadolinium enhancement in the left ventricular myocardium. Six standard deviation assessment used.  2. Normal right ventricular size with RVEDVI 68 mL/m2.  Normal right ventricular thickness.  Normal right ventricular systolic function (RVEF =60%). There are no regional wall motion abnormalities or aneurysms.  3. Normal right atrial size.  Moderate left atrial dilation. Lipomatous interatrial septal hypertrophy noted. No PFO noted.  4. Normal size of the aortic root, ascending aorta and pulmonary artery.  5. Valve assessment:  Aortic Valve:Tri-leaflet aortic valve. Trivial aortic regurgitation.  Pulmonic Valve: Trivial pulmonic regurgitation.  Tricuspid Valve: Mild tricuspid regurgitation. Regurgitant fraction 8%.  Mitral Valve: Mild tricuspid regurgitation. Regurgitant fraction 8%.  6.  Normal pericardium.  No pericardial effusion.  7. Grossly, no extracardiac findings. Recommended dedicated study if concerned for non-cardiac pathology.  LV EF:  % (Normal 56-78%)  Absolute volumes:  LV EDV: mL (Normal 52-141 mL)  LV ESV: mL (Normal 13-51 mL)  LV SV: mL (Normal 33-97 mL)  CO: L/min (Normal 2.7-6.0 L/min)  Indexed volumes:  LV EDV: mL/sq-m (Normal 41-81 mL/sq-m)  LV ESV: mL/sq-m (Normal 12-21 mL/sq-m)  LV SV: mL/sq-m (Normal 26-56 mL/sq-m)  CI: L/min/sq-m (Normal 1.8-3.8 L/min/sq-m)  LV female  LV EF: % (Normal 56-78%)  Absolute volumes:  LV EDV: mL (Normal 77-195 mL)  LV ESV: mL (Normal 19-72 mL)  LV SV: mL (Normal 51-133 mL)  CO: L/min (Normal 2.8-8.8 L/min)  Indexed volumes:  LV EDV: mL/sq-m (Normal 47-92 mL/sq-m)  LV ESV: mL/sq-m (Normal 13-30 mL/sq-m)  LV SV: mL/sq-m (Normal 32-62 mL/sq-m)  CI: L/min/sq-m (Normal 1.7-4.2 L/min/sq-m)  Right ventricle:  RV female  RV EF: % (Normal 47-80%)  Absolute volumes:  RV EDV: mL (Normal 58-154 mL)  RV ESV: mL (Normal 12-68 mL)  RV SV: mL (Normal 35-98 mL)  CO: L/min (Normal 2.7-6 L/min)  Indexed volumes:  RV EDV: mL/sq-m (Normal 48-87 mL/sq-m)  RV ESV: mL/sq-m (Normal 11-28 mL/sq-m)  RV SV: mL/sq-m (Normal 27-57 mL/sq-m)  CI: L/min/sq-m (Normal 1.8-3.8 L/min/sq-m)  RV female  RV EF:  % (Normal 47-74%)  Absolute volumes:  RV EDV: mL (Normal 88-227 mL)  RV ESV: mL (Normal 23-103 mL)  RV SV: mL (Normal 52-138  mL)  CO: L/min (Normal 2.8-8.8 L/min)  IMPRESSION: There is not severe hypertrophy in this study. In comparison to 2020 cardiac CT, septal hypertrophy has improved from 18 mm to 11 mm.  No high risk features of hypertrophic cardiomyopathy.  Riley Lam MD   Electronically Signed By: Riley Lam M.D. On: 04/22/2023 13:39         Recent Labs: No results found for requested labs within last 365 days.  Recent Lipid Panel No results found for: "CHOL", "TRIG", "HDL", "CHOLHDL", "VLDL", "LDLCALC", "LDLDIRECT"   Physical Exam:    VS:  BP 126/68   Pulse 62   Ht 5' 3.5" (1.613 m)   Wt 163 lb (73.9 kg)   SpO2 98%   BMI 28.42 kg/m     Wt Readings from Last 3 Encounters:  08/14/23 163 lb (73.9 kg)  01/20/23 159 lb (72.1 kg)  07/14/22 148 lb (67.1 kg)    GEN:  Well nourished, well developed in no acute distress HEENT: Normal NECK: No JVD CARDIAC: RRR, systolic heart murmur only  RESPIRATORY:  Clear to auscultation without rales, wheezing or rhonchi  ABDOMEN: Soft, non-tender, non-distended MUSCULOSKELETAL:  No edema; No deformity  SKIN: Warm and dry NEUROLOGIC:  Alert and oriented x 3 PSYCHIATRIC:  Normal affect   ASSESSMENT:    1. HOCM (hypertrophic obstructive cardiomyopathy) (HCC)   2. Palpitations   3. Primary hypertension     PLAN:    Hypertrophic Cardiomyopathy - Septal Variant - peak gradient 20 mm Hg (exercise) on 10 mg Mavacamten  - Mild MR - NYHA I - Family history reviewed, Discussed family screening  - Daughter not interested in testing, brother needs testing  Risk of SCD at 5 years(%) 0.71 Recommendation Based on the absence of risk factors, this patient does not have an indication for an ICD (Class 3 - No Benefit)  We have discussed a back up plan for Medicare coverage: aficamten vs [facility] SRT if worsening sx  Met her exercise prescription from prior note  Diet Prescription Type: Mediterranean; carb  conscious Weight loss goal if applicable: NA Limitations: fruit spikes her sugar despite fiber, will defer Gundry Diet; reviewed HCM data on Mediterranean diet Meal plan: Created one day meal plan and gave online resources for weekly planning; patient is amenable to diet created      Dark chocolate Cruciferous vegetables- high in fiber and protein, no legumne restriction Nuts- high in fiber and protein that increase feelings of fullness; no restriction to peanuts   Non HCM Cardiac care Non obstructive CAD- LDL at goal; no symptoms on ASA on statin; labs in November with PCP  Time Spent Directly with Patient:   I have spent a total of 40 minutes with the patient reviewing notes, imaging, EKGs, labs and examining the patient  as well as establishing an assessment and plan that was discussed personally with the patient.  > 50% of time was spent in direct patient care   Medication Adjustments/Labs and Tests Ordered: Current medicines are reviewed at length with the patient today.  Concerns regarding medicines are outlined above.  Orders Placed This Encounter  Procedures   EKG 12-Lead   No orders of the defined types were placed in this encounter.    Patient Instructions  Medication Instructions:  Your physician recommends that you continue on your current medications as directed. Please refer to the Current Medication list given to you today.  *If you need a refill on your cardiac medications before your next appointment, please call your pharmacy*   Lab Work: NONE If you have labs (blood work) drawn today and your tests are completely normal, you will receive your results only by: MyChart Message (if you have MyChart) OR A paper copy in the mail If you have any lab test that is abnormal or we need to change your treatment, we will call you to review the results.   Testing/Procedures: NONE   Follow-Up: At [facility], you and your health needs are our  priority.  As part of our continuing mission to provide you with exceptional heart care, we have created designated Provider Care Teams.  These Care Teams include your primary Cardiologist (physician) and Advanced Practice Providers (APPs -  Physician Assistants and Nurse Practitioners) who all work together to provide you with the care you need, when you need it.      Your next appointment:   April 2025  Provider:   Riley Lam, MD       Signed, Christell Constant, MD  08/14/2023 10:31 AM    [facility]

## 2023-08-14 ENCOUNTER — Encounter: Payer: Self-pay | Admitting: Internal Medicine

## 2023-08-14 ENCOUNTER — Ambulatory Visit: Payer: BC Managed Care – PPO | Admitting: Internal Medicine

## 2023-08-14 VITALS — BP 126/68 | HR 62 | Ht 63.5 in | Wt 163.0 lb

## 2023-08-14 DIAGNOSIS — R002 Palpitations: Secondary | ICD-10-CM

## 2023-08-14 DIAGNOSIS — I10 Essential (primary) hypertension: Secondary | ICD-10-CM

## 2023-08-14 DIAGNOSIS — I421 Obstructive hypertrophic cardiomyopathy: Secondary | ICD-10-CM

## 2023-08-14 NOTE — Patient Instructions (Signed)
Medication Instructions:  Your physician recommends that you continue on your current medications as directed. Please refer to the Current Medication list given to you today.  *If you need a refill on your cardiac medications before your next appointment, please call your pharmacy*   Lab Work: NONE If you have labs (blood work) drawn today and your tests are completely normal, you will receive your results only by: MyChart Message (if you have MyChart) OR A paper copy in the mail If you have any lab test that is abnormal or we need to change your treatment, we will call you to review the results.   Testing/Procedures: NONE   Follow-Up: At [facility], you and your health needs are our priority.  As part of our continuing mission to provide you with exceptional heart care, we have created designated Provider Care Teams.  These Care Teams include your primary Cardiologist (physician) and Advanced Practice Providers (APPs -  Physician Assistants and Nurse Practitioners) who all work together to provide you with the care you need, when you need it.      Your next appointment:   April 2025  Provider:   Riley Lam, MD

## 2023-08-21 ENCOUNTER — Ambulatory Visit: Payer: BC Managed Care – PPO | Admitting: Internal Medicine

## 2023-08-25 ENCOUNTER — Ambulatory Visit (HOSPITAL_COMMUNITY): Payer: BC Managed Care – PPO | Attending: Internal Medicine

## 2023-08-25 DIAGNOSIS — I421 Obstructive hypertrophic cardiomyopathy: Secondary | ICD-10-CM | POA: Diagnosis present

## 2023-08-25 LAB — ECHOCARDIOGRAM LIMITED
Area-P 1/2: 3.12 cm2
S' Lateral: 2.4 cm

## 2023-08-27 ENCOUNTER — Telehealth: Payer: Self-pay

## 2023-08-27 DIAGNOSIS — I421 Obstructive hypertrophic cardiomyopathy: Secondary | ICD-10-CM

## 2023-08-27 NOTE — Telephone Encounter (Signed)
-----   Message from [facility] A Izora Ribas sent at 08/26/2023 10:29 AM EDT ----- Results: Peak LVOT Gradient: 17 mm HG LVEF : 68% Plan: Mavacamten dose 10 mg If Patient has new medications or supplements, will need medication review for potential interactions Echo in 11-12 weeks  Christell Constant, MD

## 2023-08-27 NOTE — Telephone Encounter (Signed)
The patient has been notified of the result and verbalized understanding.  All questions (if any) were answered. Macie Burows, RN 08/27/2023 12:19 PM    Pt has not started any new medications.  No HF symptoms noted.  Pt reports since stopping Jardiance has been unable to control weight.  Was once 140 lbs is now 160's would like to know if a med like Ozempic or Wegovy could be prescribed.   Last wt on file 163 reports is 5 ft 3.5 in.  Will send to MD to advise.  Pt also reports read a study from [place] in regards to [employer].  Reports pt are able to go 6 months between echo if they are stable.  Wants to know if this is being considered in the [place].

## 2023-09-01 NOTE — Telephone Encounter (Signed)
Pt has type 1 diabetes, GLP1RA are not FDA approved in type 1 diabetics.

## 2023-09-01 NOTE — Telephone Encounter (Signed)
Called pt advised of MD response: I'm ok with Pharm D clinic eval for [facility] or Ozempic consideration and teaching if covered.  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   Every April, we lobby for changes to the REMS program.  Unfortunately it is at the discrepancy of the FDA.   Reports has an OV with [facility] pharmacist tomorrow will check to see if qualifies for Wegovy/ Ozempic.   Pt main concern is if med is compatible with mavacamten (Camzyos).  Advised will send concern to pharmacist to review.

## 2023-09-02 ENCOUNTER — Other Ambulatory Visit (HOSPITAL_BASED_OUTPATIENT_CLINIC_OR_DEPARTMENT_OTHER): Payer: Self-pay

## 2023-09-02 MED ORDER — WEGOVY 0.5 MG/0.5ML ~~LOC~~ SOAJ
0.5000 mg | SUBCUTANEOUS | 0 refills | Status: DC
  Filled 2023-09-02 – 2023-09-25 (×3): qty 2, 28d supply, fill #0

## 2023-09-02 MED ORDER — WEGOVY 2.4 MG/0.75ML ~~LOC~~ SOAJ: 2.4000 mg | SUBCUTANEOUS | 6 refills | Status: DC

## 2023-09-02 MED ORDER — WEGOVY 1.7 MG/0.75ML ~~LOC~~ SOAJ: 1.7000 mg | SUBCUTANEOUS | 0 refills | Status: DC

## 2023-09-02 MED ORDER — WEGOVY 1 MG/0.5ML ~~LOC~~ SOAJ: 1.0000 mg | SUBCUTANEOUS | 0 refills | Status: DC

## 2023-09-03 MED ORDER — CAMZYOS 10 MG PO CAPS: ORAL_CAPSULE | ORAL | 2 refills | Status: DC

## 2023-09-03 NOTE — Addendum Note (Signed)
Addended by: Macie Burows on: 09/03/2023 03:34 PM   Modules accepted: Orders

## 2023-09-03 NOTE — Telephone Encounter (Signed)
Called pt advised of pharmacist response:  Pt has type 1 diabetes, GLP1RA are not FDA approved in type 1 diabetics.  Pt reports pharmacist at [facility] said that d/t HOCM and HLD pt can still take [facility] with dx of DM1.  Pharmacist with [facility] will work to get [facility] approved for pt.  Pt had no further concerns.

## 2023-09-03 NOTE — Telephone Encounter (Signed)
Refill of mavacamten 10 mg PO every day sent to specialty pharmacy on file.  Order placed for limited echo due 11/23-12/1.

## 2023-09-14 ENCOUNTER — Other Ambulatory Visit (HOSPITAL_BASED_OUTPATIENT_CLINIC_OR_DEPARTMENT_OTHER): Payer: Self-pay

## 2023-09-25 ENCOUNTER — Other Ambulatory Visit (HOSPITAL_BASED_OUTPATIENT_CLINIC_OR_DEPARTMENT_OTHER): Payer: Self-pay

## 2023-09-30 ENCOUNTER — Telehealth: Payer: Self-pay | Admitting: Cardiovascular Disease

## 2023-09-30 ENCOUNTER — Telehealth: Payer: Self-pay | Admitting: Pharmacy Technician

## 2023-09-30 ENCOUNTER — Encounter: Payer: Self-pay | Admitting: Internal Medicine

## 2023-09-30 NOTE — Telephone Encounter (Signed)
Called [employer] [facility] and patient's prior authorization is going to expire on 11/7. They are requesting a new prior auth to continue medication.

## 2023-09-30 NOTE — Telephone Encounter (Signed)
Pt c/o medication issue:  1. Name of Medication:   mavacamten (CAMZYOS) 10 MG CAPS capsule    2. How are you currently taking this medication (dosage and times per day)?    3. Are you having a reaction (difficulty breathing--STAT)? no  4. What is your medication issue? Stated the auth for this medication needs to be resubmitted. Phone # for this is [phone]. Please advise

## 2023-09-30 NOTE — Telephone Encounter (Signed)
Pharmacy Patient Advocate Encounter   Received notification from Pt Calls Messages that prior authorization for camzyos is required/requested.   Insurance verification completed.   The patient is insured through [facility] .   Per test claim: PA required; PA submitted to [facility] via CoverMyMeds Key/confirmation #/EOC faxed Status is pending

## 2023-10-01 NOTE — Telephone Encounter (Signed)
Notified patient PA approved.

## 2023-10-01 NOTE — Telephone Encounter (Signed)
Pharmacy Patient Advocate Encounter  Received notification from [facility] that Prior Authorization for camzyos has been APPROVED from 09/30/23 to 09/29/24   PA #/Case ID/Reference #: [id number]

## 2023-11-04 ENCOUNTER — Encounter (HOSPITAL_COMMUNITY): Payer: Self-pay

## 2023-11-04 ENCOUNTER — Other Ambulatory Visit (HOSPITAL_COMMUNITY): Payer: Self-pay

## 2023-11-04 ENCOUNTER — Other Ambulatory Visit: Payer: Self-pay

## 2023-11-04 MED ORDER — METOPROLOL SUCCINATE ER 50 MG PO TB24
75.0000 mg | ORAL_TABLET | Freq: Every day | ORAL | 3 refills | Status: DC
  Filled 2023-11-04: qty 135, 90d supply, fill #0

## 2023-11-05 ENCOUNTER — Other Ambulatory Visit (HOSPITAL_COMMUNITY): Payer: Self-pay

## 2023-11-06 ENCOUNTER — Ambulatory Visit (HOSPITAL_COMMUNITY): Payer: BC Managed Care – PPO | Attending: Internal Medicine

## 2023-11-06 DIAGNOSIS — I421 Obstructive hypertrophic cardiomyopathy: Secondary | ICD-10-CM | POA: Diagnosis present

## 2023-11-06 LAB — ECHOCARDIOGRAM LIMITED
Area-P 1/2: 2.62 cm2
S' Lateral: 2.2 cm

## 2023-11-08 ENCOUNTER — Telehealth: Payer: Self-pay | Admitting: Internal Medicine

## 2023-11-08 DIAGNOSIS — I421 Obstructive hypertrophic cardiomyopathy: Secondary | ICD-10-CM

## 2023-11-08 NOTE — Telephone Encounter (Signed)
Patient notes that she is doing well.   No chest pain or pressure.  No SOB/DOE and no PND/Orthopnea.  No weight gain or leg swelling.  No palpitations or syncope .  No new medications LVEF 65% LVOT gradient 29 mm Hg  Continue medication and echo in 12 weeks.  Riley Lam, MD FASE [facility] Cardiologist [facility]  [address] [place], [address] [place], [place] [address] [phone]  12:43 PM

## 2023-11-09 ENCOUNTER — Other Ambulatory Visit: Payer: Self-pay

## 2023-11-09 MED ORDER — CAMZYOS 10 MG PO CAPS: ORAL_CAPSULE | ORAL | 2 refills | Status: DC

## 2023-11-09 NOTE — Telephone Encounter (Signed)
PSF updated on Camzyos REMS portal.  Order for 12 week limited echo placed due 2/15-2/23/25.  Mavacamten (Camzyos) 10 mg PO every day refilled.

## 2023-11-09 NOTE — Addendum Note (Signed)
Addended by: Macie Burows on: 11/09/2023 03:02 PM   Modules accepted: Orders

## 2024-02-02 ENCOUNTER — Ambulatory Visit (HOSPITAL_COMMUNITY): Payer: 59 | Attending: Internal Medicine

## 2024-02-02 DIAGNOSIS — I421 Obstructive hypertrophic cardiomyopathy: Secondary | ICD-10-CM | POA: Diagnosis present

## 2024-02-02 LAB — ECHOCARDIOGRAM LIMITED
Area-P 1/2: 3.34 cm2
MV M vel: 4.92 m/s
MV Peak grad: 96.8 mm[Hg]
MV VTI: 1.8 cm2
S' Lateral: 2.25 cm

## 2024-02-02 NOTE — Telephone Encounter (Signed)
 Closing encounter

## 2024-02-04 ENCOUNTER — Telehealth: Payer: Self-pay | Admitting: Internal Medicine

## 2024-02-04 DIAGNOSIS — I421 Obstructive hypertrophic cardiomyopathy: Secondary | ICD-10-CM

## 2024-02-04 MED ORDER — MAVACAMTEN 15 MG PO CAPS: 15.0000 mg | ORAL_CAPSULE | Freq: Every day | ORAL | 2 refills | Status: DC

## 2024-02-04 NOTE — Addendum Note (Signed)
 Addended by: Macie Burows on: 02/04/2024 02:50 PM   Modules accepted: Orders

## 2024-02-04 NOTE — Telephone Encounter (Signed)
 PSF updated on Camzyos REMS portal.  New prescription for camzyos 15 mg sent to specialty pharmacy on file.

## 2024-02-04 NOTE — Telephone Encounter (Signed)
 Patient notes that she is doing a bit worse.   Since last visit notes that she went on a cruise- had one episode of palpitations . No new medications. No new hospitalizations.  One episode of exertional chest pain.  No SOB but rare DOE with exercise and no PND/Orthopnea.  No weight gain or leg swelling.  No syncope.  LVEF 60%. LVOT gradient 21 mm Hg Valsalva) NYHA II - discussed increase dose for CMI vs surgical eval.  Sx not that bad per patient - will increase to 15 mg, echo in 4 weeks then 12 weeks as per protocol  Riley Lam, MD FASE [facility] Cardiologist [facility]  [address] [place], [address] [place], [place] [address] [phone]  12:45 PM

## 2024-02-09 NOTE — Addendum Note (Signed)
 Addended by: Macie Burows on: 02/09/2024 12:56 PM   Modules accepted: Orders

## 2024-02-26 ENCOUNTER — Encounter: Payer: Self-pay | Admitting: Internal Medicine

## 2024-03-01 ENCOUNTER — Ambulatory Visit (HOSPITAL_COMMUNITY): Payer: 59 | Attending: Internal Medicine

## 2024-03-01 DIAGNOSIS — I421 Obstructive hypertrophic cardiomyopathy: Secondary | ICD-10-CM

## 2024-03-01 LAB — ECHOCARDIOGRAM LIMITED
Area-P 1/2: 3.44 cm2
MV VTI: 1.9 cm2
S' Lateral: 2 cm

## 2024-03-02 ENCOUNTER — Telehealth: Payer: Self-pay | Admitting: Internal Medicine

## 2024-03-02 ENCOUNTER — Encounter: Payer: Self-pay | Admitting: Internal Medicine

## 2024-03-02 NOTE — Telephone Encounter (Signed)
 [facility] to get [facility] results uploaded as soon as possible for pt to get her CAMZYOS

## 2024-03-02 NOTE — Telephone Encounter (Signed)
 error

## 2024-03-03 ENCOUNTER — Telehealth: Payer: Self-pay | Admitting: Internal Medicine

## 2024-03-03 DIAGNOSIS — I421 Obstructive hypertrophic cardiomyopathy: Secondary | ICD-10-CM

## 2024-03-03 NOTE — Telephone Encounter (Signed)
 Patient notes that she is doing well.   Since last visit notes not new medication . There are no interval hospital/ED visit.   Echo showed LVEF 65%.  LVOT gradient 6 mmHg.  No chest pain or pressure .  No SOB improved DOE and no PND/Orthopnea.  No weight gain or leg swelling.  No palpitations or syncope .  She needs echo in 8 weeks as per protocol (dose increase to 15 mg).  Then return to maintenance phase.   Riley Lam, MD FASE [facility] Cardiologist [facility]  [address] [place], [address] [place], [place] [address] [phone]  12:01 PM

## 2024-03-04 MED ORDER — MAVACAMTEN 15 MG PO CAPS: 15.0000 mg | ORAL_CAPSULE | Freq: Every day | ORAL | 1 refills | Status: DC

## 2024-03-04 NOTE — Telephone Encounter (Signed)
 PSF updated on Camzyos REMS portal.  Refill of mavacamten (Camzyos) 15 mg PO QD sent to specialty pharmacy on file.

## 2024-03-04 NOTE — Addendum Note (Signed)
 Addended by: Macie Burows on: 03/04/2024 10:30 AM   Modules accepted: Orders

## 2024-03-04 NOTE — Telephone Encounter (Signed)
 Camzy[employer] REMS portal PSF updated today.

## 2024-03-04 NOTE — Telephone Encounter (Signed)
 [facility] gave a VO for mavacamten 15 mg PO every day; d/t script not going through via E scribe.  Pharmacist reports pt has refills already V.O not needed.

## 2024-03-04 NOTE — Addendum Note (Signed)
 Addended by: Macie Burows on: 03/04/2024 09:42 AM   Modules accepted: Orders

## 2024-03-09 ENCOUNTER — Encounter: Payer: Self-pay | Admitting: Internal Medicine

## 2024-03-13 NOTE — Addendum Note (Signed)
 Addended by: Riley Lam A on: 03/13/2024 08:13 AM   Modules accepted: Orders

## 2024-04-26 ENCOUNTER — Other Ambulatory Visit (HOSPITAL_COMMUNITY): Payer: 59

## 2024-04-29 ENCOUNTER — Other Ambulatory Visit (HOSPITAL_COMMUNITY)

## 2024-05-03 ENCOUNTER — Ambulatory Visit (HOSPITAL_COMMUNITY)
Admission: RE | Admit: 2024-05-03 | Discharge: 2024-05-03 | Disposition: A | Discharge status: Home / Self Care | Source: Ambulatory Visit | Attending: Cardiology | Admitting: Cardiology

## 2024-05-03 DIAGNOSIS — I421 Obstructive hypertrophic cardiomyopathy: Secondary | ICD-10-CM | POA: Diagnosis present

## 2024-05-03 LAB — ECHOCARDIOGRAM LIMITED
Area-P 1/2: 3.54 cm2
S' Lateral: 2.3 cm

## 2024-05-04 ENCOUNTER — Telehealth: Payer: Self-pay | Admitting: Internal Medicine

## 2024-05-04 DIAGNOSIS — I421 Obstructive hypertrophic cardiomyopathy: Secondary | ICD-10-CM

## 2024-05-04 MED ORDER — MAVACAMTEN 15 MG PO CAPS: 15.0000 mg | ORAL_CAPSULE | Freq: Every day | ORAL | 5 refills | Status: DC

## 2024-05-04 NOTE — Telephone Encounter (Signed)
 Patient notes that she is doing well.   Since last visit notes one solitary episode of dizziness- this has resolved . There are no interval hospital/ED visit.   Echo showed LVOT gradient 6 at worst LVEF 61%  No chest pain or pressure .  No SOB/DOE and no PND/Orthopnea.  No weight gain or leg swelling. No new medications  Continue current mavacamten .  Stable dose `6 month f/u as per protocol - if new dizziness returns, would consider heart monitor for AF eval and stress echo  Gloriann Larger, MD FASE [facility] Cardiologist [facility]  [address] [place], [place] [address] [phone]  10:51 AM

## 2024-05-04 NOTE — Addendum Note (Signed)
 Addended by: Christine Cozier on: 05/04/2024 02:12 PM   Modules accepted: Orders

## 2024-05-04 NOTE — Telephone Encounter (Signed)
 PSF updated on Camzyos  REMS portal.  [facility] mavac[facility]  15 mg PO every day quantity 35 with 5 refills.  Epic refill did not go through.

## 2024-05-23 ENCOUNTER — Encounter: Payer: Self-pay | Admitting: Internal Medicine

## 2024-05-23 NOTE — Telephone Encounter (Signed)
 Please assist

## 2024-05-24 ENCOUNTER — Other Ambulatory Visit (HOSPITAL_COMMUNITY): Payer: Self-pay

## 2024-05-24 ENCOUNTER — Telehealth: Payer: Self-pay | Admitting: Pharmacy Technician

## 2024-05-24 NOTE — Telephone Encounter (Signed)
 Pharmacy Patient Advocate Encounter   Received notification from Patient Advice Request messages that prior authorization for camzyos  is required/requested.   Insurance verification completed.   The patient is insured through [employer] .   Per test claim: PA required; PA submitted to above mentioned insurance via CoverMyMeds Key/confirmation #/EOC [id number] Status is pending

## 2024-05-24 NOTE — Telephone Encounter (Signed)
 Pharmacy Patient Advocate Encounter  Received notification from [employer] that Prior Authorization for Camzyos  has been APPROVED from 12/16/23 to 12/14/24  Test claim : $100.00 for 30 days  PA #/Case ID/Reference #: [id number]

## 2024-07-13 ENCOUNTER — Ambulatory Visit: Admitting: Podiatry

## 2024-07-15 ENCOUNTER — Ambulatory Visit

## 2024-07-15 ENCOUNTER — Encounter: Payer: Self-pay | Admitting: Podiatry

## 2024-07-15 ENCOUNTER — Ambulatory Visit: Admitting: Podiatry

## 2024-07-15 DIAGNOSIS — M7752 Other enthesopathy of left foot: Secondary | ICD-10-CM

## 2024-07-15 DIAGNOSIS — M7750 Other enthesopathy of unspecified foot: Secondary | ICD-10-CM

## 2024-07-15 MED ORDER — TRIAMCINOLONE ACETONIDE 10 MG/ML IJ SUSP
10.0000 mg | Freq: Once | INTRAMUSCULAR | Status: AC
  Administered 2024-07-15: 10 mg via INTRA_ARTICULAR

## 2024-07-16 NOTE — Progress Notes (Signed)
 Subjective:   Patient ID: Wanda Hicks, female   DOB: 63 y.o.   MRN: [id number]   HPI Patient presents with a lot of pain in the fourth digit left foot with fluid buildup around the joint and rotation of the fifth toe   ROS      Objective:  Physical Exam  Keratotic lesion digit 4 left lateral side with fluid buildup around the inner phalangeal joint     Assessment:  Inflammatory capsulitis inner phalangeal joint digit 4 left with hammertoe deformity noted     Plan:  H&P x-ray reviewed and at this point sterile prep injected the inner phalangeal joint 2 mg dexamethasone Kenalog  5 mg Xylocaine  debrided lesion courtesy and advised on wider shoes.  If symptoms persist will require arthroplasty  X-rays indicate rotation of the fifth digit left pressing against the fourth toe left

## 2024-08-02 ENCOUNTER — Telehealth: Payer: Self-pay

## 2024-08-02 NOTE — Telephone Encounter (Signed)
 Called pt left a message asking if could come in for HOCM OV with Tessa on /22/25 for follow up.  Asked that pt call in or send a message through my chart to schedule OV.

## 2024-08-26 DIAGNOSIS — M7061 Trochanteric bursitis, right hip: Secondary | ICD-10-CM | POA: Diagnosis not present

## 2024-08-26 DIAGNOSIS — M25512 Pain in left shoulder: Secondary | ICD-10-CM | POA: Diagnosis not present

## 2024-09-06 DIAGNOSIS — M7582 Other shoulder lesions, left shoulder: Secondary | ICD-10-CM | POA: Diagnosis not present

## 2024-09-06 DIAGNOSIS — M7061 Trochanteric bursitis, right hip: Secondary | ICD-10-CM | POA: Diagnosis not present

## 2024-09-20 ENCOUNTER — Ambulatory Visit (HOSPITAL_COMMUNITY)
Admission: RE | Admit: 2024-09-20 | Discharge: 2024-09-20 | Disposition: A | Discharge status: Home / Self Care | Source: Ambulatory Visit | Attending: Internal Medicine | Admitting: Internal Medicine

## 2024-09-20 DIAGNOSIS — I421 Obstructive hypertrophic cardiomyopathy: Secondary | ICD-10-CM | POA: Diagnosis not present

## 2024-09-21 DIAGNOSIS — Z4681 Encounter for fitting and adjustment of insulin pump: Secondary | ICD-10-CM | POA: Diagnosis not present

## 2024-09-21 DIAGNOSIS — Z794 Long term (current) use of insulin: Secondary | ICD-10-CM | POA: Diagnosis not present

## 2024-09-21 DIAGNOSIS — E1069 Type 1 diabetes mellitus with other specified complication: Secondary | ICD-10-CM | POA: Diagnosis not present

## 2024-09-21 DIAGNOSIS — I110 Hypertensive heart disease with heart failure: Secondary | ICD-10-CM | POA: Diagnosis not present

## 2024-09-21 DIAGNOSIS — I2510 Atherosclerotic heart disease of native coronary artery without angina pectoris: Secondary | ICD-10-CM | POA: Diagnosis not present

## 2024-09-21 LAB — ECHOCARDIOGRAM LIMITED
Area-P 1/2: 3.86 cm2
S' Lateral: 2.1 cm

## 2024-09-22 ENCOUNTER — Telehealth: Payer: Self-pay | Admitting: Internal Medicine

## 2024-09-22 DIAGNOSIS — I421 Obstructive hypertrophic cardiomyopathy: Secondary | ICD-10-CM

## 2024-09-22 NOTE — Telephone Encounter (Signed)
 Patient notes that she is doing .   Since last visit notes minimal symptoms . There are no interval hospital/ED visit.   Echo showed LVEF 60% and  LVOT gradient of 9 mm Hg  No chest pain or pressure .  No SOB and no PND/Orthopnea.  No weight gain or leg swelling.  No palpitations or syncope.  Two episodes of hiking associated dyspnea; that resolved fairly quickly.  No new medications.  Mavacamten  15 mg. F/u with Tessa in 3 months, echo in 6 months, me in 9 months  She is interested in a Lpa trial NOS.  I have reviewed some of the trials for this that I am aware of.  She has not checked her Lpa yet.  None of the Lpa siRNA trials I am aware of would have a clear 3A4 or 2C19 interaction.  If she would like to join a trial we would do our best to support her.  .[email or web address]

## 2024-09-26 MED ORDER — MAVACAMTEN 15 MG PO CAPS: 15.0000 mg | ORAL_CAPSULE | Freq: Every day | ORAL | 5 refills | Status: AC

## 2024-09-26 NOTE — Addendum Note (Signed)
 Addended by: RANDY HAMP SAILOR on: 09/26/2024 08:33 AM   Modules accepted: Orders

## 2024-09-26 NOTE — Telephone Encounter (Signed)
 PSF updated in Cam[employer]os  REMS portal.  Refill of mavacamten  15 mg sent to specialty pharmacy on file.

## 2024-09-27 DIAGNOSIS — M7582 Other shoulder lesions, left shoulder: Secondary | ICD-10-CM | POA: Diagnosis not present

## 2024-09-27 DIAGNOSIS — M7061 Trochanteric bursitis, right hip: Secondary | ICD-10-CM | POA: Diagnosis not present

## 2024-09-27 DIAGNOSIS — Z23 Encounter for immunization: Secondary | ICD-10-CM | POA: Diagnosis not present

## 2024-09-28 DIAGNOSIS — Z961 Presence of intraocular lens: Secondary | ICD-10-CM | POA: Diagnosis not present

## 2024-09-28 DIAGNOSIS — H18593 Other hereditary corneal dystrophies, bilateral: Secondary | ICD-10-CM | POA: Diagnosis not present

## 2024-09-28 DIAGNOSIS — H52203 Unspecified astigmatism, bilateral: Secondary | ICD-10-CM | POA: Diagnosis not present

## 2024-09-28 DIAGNOSIS — E119 Type 2 diabetes mellitus without complications: Secondary | ICD-10-CM | POA: Diagnosis not present

## 2024-10-31 ENCOUNTER — Other Ambulatory Visit: Payer: Self-pay | Admitting: Internal Medicine

## 2024-11-03 ENCOUNTER — Other Ambulatory Visit: Payer: Self-pay | Admitting: Internal Medicine

## 2024-11-18 ENCOUNTER — Telehealth: Payer: Self-pay | Admitting: Internal Medicine

## 2024-11-18 ENCOUNTER — Other Ambulatory Visit (HOSPITAL_COMMUNITY): Payer: Self-pay

## 2024-11-18 ENCOUNTER — Telehealth: Payer: Self-pay | Admitting: Pharmacy Technician

## 2024-11-18 NOTE — Telephone Encounter (Signed)
   Too soon until 12/02/24  Pharmacy Patient Advocate Encounter   Received notification from CoverMyMeds that prior authorization for camzyos  is required/requested.   Insurance verification completed.   The patient is insured through [employer].   Per test claim: PA required; PA submitted to above mentioned insurance via Latent Key/confirmation #/EOC [id number] Status is pending

## 2024-11-18 NOTE — Telephone Encounter (Signed)
 Approved 30 days

## 2024-11-18 NOTE — Telephone Encounter (Signed)
 Pt c/o medication issue:  1. Name of Medication: mavacamten  (CAMZYOS ) 15 MG CAPS capsule   2. How are you currently taking this medication (dosage and times per day)? As written  3. Are you having a reaction (difficulty breathing--STAT)? no  4. What is your medication issue? Pt wants to speak to someone about the prior auth for this medication

## 2024-11-28 ENCOUNTER — Other Ambulatory Visit: Payer: Self-pay | Admitting: Internal Medicine

## 2024-11-29 ENCOUNTER — Other Ambulatory Visit (HOSPITAL_COMMUNITY): Payer: Self-pay

## 2024-11-29 NOTE — Telephone Encounter (Signed)
° °  I called the patient and lmom  This letter is from 11/18/24. Too soon until 11/30/24 to see if claim goes through. Insurance said should go through for 30 days. Tried pa again for qty 30   Pharmacy Patient Advocate Encounter   Received notification from Pt Calls Messages that prior authorization for camzyos  15mg  is required/requested.   Insurance verification completed.   The patient is insured through [employer].  Limited Day Supply Guidance allows only 30-day fills for Camzyos . [id number]   Per pa

## 2024-11-29 NOTE — Telephone Encounter (Signed)
 Patient called back and she said she will call me if any problems with her camzyos  since insurance saying 30 should go through

## 2024-12-19 ENCOUNTER — Other Ambulatory Visit: Payer: Self-pay | Admitting: Internal Medicine

## 2024-12-21 ENCOUNTER — Encounter: Payer: Self-pay | Admitting: Pharmacist

## 2024-12-23 ENCOUNTER — Encounter (HOSPITAL_COMMUNITY): Payer: Self-pay

## 2024-12-27 ENCOUNTER — Ambulatory Visit: Attending: Internal Medicine | Admitting: Internal Medicine

## 2024-12-27 VITALS — BP 116/69 | HR 65 | Ht 63.5 in | Wt 149.0 lb

## 2024-12-27 DIAGNOSIS — I421 Obstructive hypertrophic cardiomyopathy: Secondary | ICD-10-CM

## 2024-12-27 DIAGNOSIS — R002 Palpitations: Secondary | ICD-10-CM | POA: Diagnosis not present

## 2024-12-27 DIAGNOSIS — I2510 Atherosclerotic heart disease of native coronary artery without angina pectoris: Secondary | ICD-10-CM | POA: Diagnosis not present

## 2024-12-27 MED ORDER — METOPROLOL SUCCINATE ER 50 MG PO TB24: 50.0000 mg | ORAL_TABLET | Freq: Every day | ORAL | 3 refills | Status: DC

## 2024-12-27 NOTE — Progress Notes (Signed)
 " Cardiology Office Note:    Date:  12/27/2024   ID:  Wanda Hicks, DOB Aug 18, 1961, MRN [id number]  PCP:  Shepard Ade, MD   [facility] Providers Cardiologist:  Jerel Balding, MD     Referring MD: Shepard Ade, MD   CC:  HCM follow up  History of Present Illness:    Wanda Hicks is a 64 y.o. 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  2022:  She was started on mavacamten  5 mg and completed a 12 week titration.  She has had improvement in symptoms.   2023: Increase in gradient.  Started on mavacamten  10 mg. 2024: Gradients did well.  Working as a education officer, environmental for [employer].  Septal thickness has decreased from 18 mm to 10 mm.  Peak exercise gradient 20 mm Hg. 2025: Transitioned to six month follow up  Wanda Hicks is a 64 year old female with hypertrophic cardiomyopathy who presents for an annual follow-up visit.  She has been managing her hypertrophic cardiomyopathy well, with no significant issues related to dizziness or exercise intolerance. She maintains her exercise routine without difficulty. Her current medication regimen includes Mavacamten  and metoprolol .  She is working on improving her eating habits after the holidays, mentioning challenges with 'food noise.' This indicates a focus on dietary management, which is important for overall health.  Her daughter, who resides in [place] , has ADHD and is aware of the need for screening for type 1 diabetes, although she has not yet undergone screening. She has offered to assist her daughter with the screening process, highlighting her proactive approach to family health management.   Past Medical History:  Diagnosis Date   Arthritis    Bronchitis    Chronic diastolic heart failure (HCC) 10/19/2014   Diabetes mellitus without complication (HCC)    Type 1; pt has insulin  pump   Family  history of adverse reaction to anesthesia    Patients mother has N/V   GERD (gastroesophageal reflux disease)    Heart murmur    Hypertension    Pneumonia    hx of   Positive PPD    Shortness of breath dyspnea    uses Albuterol  inhaler PRN    Past Surgical History:  Procedure Laterality Date   CARPAL TUNNEL RELEASE Bilateral    CERVICAL DISCECTOMY     X 2   CESAREAN SECTION     COLONOSCOPY     EYE SURGERY     as a child   MAXIMUM ACCESS (MAS)POSTERIOR LUMBAR INTERBODY FUSION (PLIF) 1 LEVEL N/A 07/10/2016   Procedure: Lumbar four-five Maximum access posterior lumbar interbody fusion with resection of synovial cyst;  Surgeon: Fairy Levels, MD;  Location: [facility];  Service: Neurosurgery;  Laterality: N/A;   TRIGGER FINGER RELEASE Bilateral    ULNAR NERVE REPAIR Bilateral     Current Medications: Current Meds  Medication Sig   ACCU-CHEK AVIVA PLUS test strip    acetaminophen  (TYLENOL ) 500 MG tablet Take 1,000 mg by mouth every 6 (six) hours as needed (pain).   ADVAIR DISKUS 100-50 MCG/DOSE AEPB Inhale 1 puff into the lungs daily as needed (as directed).   albuterol  (PROVENTIL  HFA;VENTOLIN  HFA) 108 (90 Base) MCG/ACT inhaler Inhale 1 puff into the lungs every 6 (six) hours as needed for wheezing or shortness of breath.   aspirin  81 MG EC tablet Take 81 mg by mouth daily.  Biotin 10 MG TABS daily at 6 (six) AM.   Continuous Blood Gluc Sensor (FREESTYLE LIBRE 2 SENSOR) MISC Change every 14 days to monitor blood glucose continously   Continuous Glucose Transmitter (DEXCOM G6 TRANSMITTER) MISC continuous.   cyclobenzaprine  (FLEXERIL ) 10 MG tablet at bedtime. (Patient taking differently: as needed for muscle spasms.)   estradiol (ESTRACE) 0.1 MG/GM vaginal cream Place vaginally 2 (two) times a week.   etodolac (LODINE) 400 MG tablet Take 400-800 mg by mouth daily.   GEMTESA 75 MG TABS daily at 6 (six) AM.   hydroxypropyl methylcellulose / hypromellose (ISOPTO TEARS / GONIOVISC)  2.5 % ophthalmic solution Place 1 drop into both eyes as needed for dry eyes (as directed).   insulin  aspart (NOVOLOG ) 100 UNIT/ML injection 30 Units as directed.   Insulin  Disposable Pump (OMNIPOD 5 G6 POD, GEN 5,) MISC Inject into the skin as directed.   Insulin  Human (INSULIN  PUMP) SOLN Inject into the skin as directed.   INSULIN  SYRINGE 1CC/29G 29G X 1/2 1 ML MISC Use as directed to administer insulin . DX: 10.9   KRILL OIL PO Take 1 tablet by mouth daily.    levothyroxine  (SYNTHROID ) 125 MCG tablet Take 125 mcg by mouth daily before breakfast.    loratadine (CLARITIN) 10 MG tablet Take 10 mg by mouth daily as needed for allergies.   losartan  (COZAAR ) 50 MG tablet Take 1 tablet (50 mg total) by mouth daily. (Patient taking differently: Take 100 mg by mouth 2 (two) times daily.)   magnesium  oxide (MAG-OX) 400 MG tablet Take 400 mg by mouth 2 (two) times daily.    mavacamten  (CAMZYOS ) 15 MG CAPS capsule Take 1 capsule (15 mg total) by mouth daily.   Multiple Vitamin (MULTIVITAMIN) tablet Take 1 tablet by mouth daily.   Multiple Vitamins-Minerals (PRESERVISION AREDS 2) CAPS as directed.   OneTouch Delica Lancets 33G MISC use as directed to test blood sugar 4-6 times daily, DX: E10.9   rosuvastatin  (CRESTOR ) 40 MG tablet Take 40 mg by mouth daily.   sodium chloride  (EQ SALINE NASAL SPRAY) 0.65 % nasal spray as needed for congestion.   terconazole (TERAZOL 7) 0.4 % vaginal cream as needed (irritation).   [DISCONTINUED] metoprolol  succinate (TOPROL -XL) 50 MG 24 hr tablet TAKE 1.5 TABLETS BY MOUTH BY MOUTH DAILY WITH OR IMMEDIATELY FOLLOWING A MEAL **APPOINTMENT REQUIRED FOR REFILLS**     Allergies:   Empagliflozin and Latex   Social History   Socioeconomic History   Marital status: Married    Spouse name: Not on file   Number of children: Not on file   Years of education: Not on file   Highest education level: Not on file  Occupational History   Not on file  Tobacco Use   Smoking  status: Never   Smokeless tobacco: Never  Substance and Sexual Activity   Alcohol  use: No   Drug use: No   Sexual activity: Not on file  Other Topics Concern   Not on file  Social History Narrative   Not on file   Social Drivers of Health   Tobacco Use: Low Risk (07/15/2024)   Patient History    Smoking Tobacco Use: Never    Smokeless Tobacco Use: Never    Passive Exposure: Not on file  Financial Resource Strain: Not on file  Food Insecurity: Not on file  Transportation Needs: Not on file  Physical Activity: Not on file  Stress: Not on file  Social Connections: Not on file  Depression (EYV7-0): Not  on file  Alcohol  Screen: Not on file  Housing: Not on file  Utilities: Not on file  Health Literacy: Not on file    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  Mother had health issues and passed in May 2023.  FIL passed.  MIL is 90.  Family History: The patient's family history includes Alcoholism in her father; Cancer in her maternal grandfather; Hypertension in her mother; Stroke in her paternal grandfather.  ROS:   Please see the history of present illness.     EKGs/Labs/Other Studies Reviewed:    Cardiac Studies & Procedures   ______________________________________________________________________________________________   STRESS TESTS  ECHOCARDIOGRAM STRESS TEST 06/23/2023  Narrative EXERCISE STRESS ECHO REPORT   --------------------------------------------------------------------------------  Patient Name:   Wanda Hicks Date of Exam: 06/23/2023 Medical Rec #:  [id number]      Height:       63.5 in Accession #:    [id number]     Weight:       159.0 lb Date of Birth:  Jul 30, 1961      BSA:          1.764 m Patient Age:    62 years       BP:           125/59 mmHg Patient Gender: F              HR:           71 bpm. Exam Location:  [address]  Procedure: Limited Echo, Stress Echo, Cardiac Doppler and Limited Color Doppler  Indications:    I42.1  HOCM  History:        Patient has prior history of Echocardiogram examinations, most recent 04/03/2023. HOCM- Camzyos  10mg .  Sonographer:    Heather Hawks RDCS Referring Phys: STANLY DELENA LEAVENS  IMPRESSIONS   FINDINGS  Exam Protocol: The patient exercised on a treadmill according to a Modified Bruce protocol.   Patient Performance: The patient exercised for 6 minutes and 23 seconds, achieving 7.5 METS. The maximum stage achieved was III of the Modified Bruce protocol. The baseline heart rate was 72 bpm. The heart rate at peak stress was 142 bpm. The target heart rate was calculated to be 133 bpm. The percentage of maximum predicted heart rate achieved was 91.0 %. The baseline blood pressure was 125/59 mmHg. The blood pressure at peak stress was 182/61 mmHg. The blood pressure response was normal. The patient developed shortness of breath, fatigue and leg fatigue during the stress exam. The symptoms resolved with rest.  EKG: Resting EKG showed normal sinus rhythm with no abnormal findings. The patient developed no diagnostic ST changes with exercise and no exercise induced arrhythmias noted.   2D Echo Findings: The baseline ejection fraction was 75%. Baseline regional wall motion abnormalities were not present.  Additional Findings: Normal LV function at baseline with LVH (septum 1.6 cm and posterior wall 1.4 cm); chordal SAM noted; moderate MAC; trace MR and TR. Baseline LVOT velocity 1.9 m/s; increase to 2.2 m/s with valsalva; with peak exertion, LVOT velocity increased to 2.9 m/s; MR increased with peak exertion but remained mild. Findings c/w HCM. Note normal blood pressure response.   Redell Shallow MD Electronically signed on 06/23/2023 at 4:23:39 PM     Final   ECHOCARDIOGRAM  ECHOCARDIOGRAM LIMITED 09/20/2024  Narrative ECHOCARDIOGRAM LIMITED REPORT    Patient Name:   Wanda Hicks Date of Exam: 09/20/2024 Medical Rec #:  [id number]      Height:  63.5  in Accession #:    [id number]     Weight:       163.0 lb Date of Birth:  [birth year]      BSA:          1.783 m Patient Age:    63 years       BP:           141/88 mmHg Patient Gender: F              HR:           71 bpm. Exam Location:  [address]  Procedure: Limited Echo, Cardiac Doppler and Limited Color Doppler (Both Spectral and Color Flow Doppler were utilized during procedure).  Indications:    I42.1 HOCM  History:        Patient has prior history of Echocardiogram examinations, most recent 05/03/2024. CHF and Hypertrophic Cardiomyopathy, Signs/Symptoms:Shortness of Breath, Dyspnea and Murmur; Risk Factors:Hypertension and Diabetes. HOCM- Camzyos  15mg  (no change from prior dose), Lightheaded episodes have returned (per patient), Palpitations.  Sonographer:    Heather Hawks RDCS Referring Phys: [facility] A SANTO   Sonographer Comments: Camzyos -15mg  IMPRESSIONS   1. On CMI therapy: LVOT gradient 9 mm Hg (Valsalva), LVEF 60%. Maximal septal thickness of 12 mm. Left ventricular ejection fraction, by estimation, is 60 to 65%. The left ventricle has normal function. There is mild concentric left ventricular hypertrophy. Left ventricular diastolic parameters were normal. 2. The mitral valve is abnormal. Trivial mitral valve regurgitation. 3. The aortic valve was not well visualized. Aortic valve regurgitation is not visualized. No aortic stenosis is present.  Comparison(s): No significant change from prior study.  FINDINGS Left Ventricle: On CMI therapy: LVOT gradient 9 mm Hg (Valsalva), LVEF 60%. Maximal septal thickness of 12 mm. Left ventricular ejection fraction, by estimation, is 60 to 65%. The left ventricle has normal function. There is mild concentric left ventricular hypertrophy. Left ventricular diastolic parameters were normal.  Mitral Valve: The mitral valve is abnormal. Trivial mitral valve regurgitation.  Aortic Valve: The aortic valve was not well  visualized. Aortic valve regurgitation is not visualized. No aortic stenosis is present.  Pulmonic Valve: The pulmonic valve was normal in structure. Pulmonic valve regurgitation is not visualized. No evidence of pulmonic stenosis.  LEFT VENTRICLE PLAX 2D LVIDd:         4.10 cm   Diastology LVIDs:         2.10 cm   LV e' medial:    6.64 cm/s LV PW:         1.00 cm   LV E/e' medial:  21.3 LV IVS:        1.20 cm   LV e' lateral:   9.68 cm/s LVOT diam:     2.00 cm   LV E/e' lateral: 14.6 LV SV:         65 LV SV Index:   37 LVOT Area:     3.14 cm   RIGHT VENTRICLE RV S prime:     10.70 cm/s  PULMONARY VEINS TAPSE (M-mode): 2.0 cm      Diastolic Velocity: 42.20 cm/s RVSP:           23.2 mmHg   S/D Velocity:       1.50 Systolic Velocity:  61.30 cm/s  LEFT ATRIUM             Index        RIGHT ATRIUM  Index LA diam:        4.30 cm 2.41 cm/m   RA Pressure: 3.00 mmHg LA Vol (A2C):   81.1 ml 45.48 ml/m  RA Area:     16.60 cm LA Vol (A4C):   77.1 ml 43.24 ml/m  RA Volume:   45.90 ml  25.74 ml/m LA Biplane Vol: 81.4 ml 45.65 ml/m AORTIC VALVE LVOT Vmax:   88.90 cm/s LVOT Vmean:  60.050 cm/s LVOT VTI:    0.208 m  AORTA Ao Root diam: 2.70 cm Ao Asc diam:  2.80 cm  MITRAL VALVE                TRICUSPID VALVE MV Area (PHT)  cm          TR Peak grad:   20.2 mmHg MV Decel Time: 197 msec     TR Vmax:        225.00 cm/s MV E velocity: 141.50 cm/s  Estimated RAP:  3.00 mmHg MV A velocity: 111.50 cm/s  RVSP:           23.2 mmHg MV E/A ratio:  1.27 SHUNTS Systemic VTI:  0.21 m Systemic Diam: 2.00 cm  Stanly Leavens MD Electronically signed by Stanly Leavens MD Signature Date/Time: 09/21/2024/6:43:42 AM    Final    MONITORS  LONG TERM MONITOR (3-14 DAYS) 01/30/2023  Narrative   Patient had a minimum heart rate of 54 bpm, maximum heart rate of 131 bpm, and average heart rate of 81 bpm.   Predominant underlying rhythm was sinus rhythm.   One 6 beat run  of SVT   Isolated PACs were rare (<1.0%).   Isolated PVCs were rare (<1.0%).   Triggered and diary events associated with sinus tachycardia.  No malignant arrhythmias in the setting of known HCM.   CT SCANS  CT CORONARY MORPH W/CTA COR W/SCORE 09/14/2019  Addendum 09/14/2019  5:07 PM ADDENDUM REPORT: 09/14/2019 17:05  CLINICAL DATA:  Chest pain  EXAM: Cardiac CTA  MEDICATIONS: Sub lingual nitro. 4mg  x 2  TECHNIQUE: The patient was scanned on a Siemens 192 slice scanner. Gantry rotation speed was 250 msecs. Collimation was 0.6 mm. A 100 kV prospective scan was triggered in the ascending thoracic aorta at 35-75% of the R-R interval. Average HR during the scan was 60 bpm. The 3D data set was interpreted on a dedicated work station using MPR, MIP and VRT modes. A total of 80cc of contrast was used.  FINDINGS: Non-cardiac: See separate report from [facility].  Mitral annular calcification noted. Pulmonary veins drain normally to the left atrium.  Calcium  Score: 198 Agatston units.  Coronary Arteries: Right dominant with no anomalies  LM: There is calcification at the ostium of the left main with mild (<50%) stenosis.  LAD system: Mixed plaque proximal LAD with minimal stenosis.  Circumflex system: Small ramus with minimal disease. Relatively small AV LCx, mixed plaque proximally with minimal stenosis.  RCA system: Mixed plaque proximal RCA, minimal stenosis.  IMPRESSION: 1. Coronary artery calcium  score 198 Agatston units. This places the patient in the 94th percentile for age and gender, suggesting high risk for future cardiac events.  2.  Nonobstructive coronary disease.  Dalton Mclean   Electronically Signed By: Ezra Shuck M.D. On: 09/14/2019 17:05  Narrative EXAM: OVER-READ INTERPRETATION  CT CHEST  The following report is an over-read performed by radiologist Dr. Franky Crease of [facility] on 09/14/2019. This over-read does  not include interpretation of cardiac or coronary anatomy  or pathology. The coronary CTA interpretation by the cardiologist is attached.  COMPARISON:  09/12/2011  FINDINGS: Vascular: Heart is normal size. Visualized aorta normal caliber. No filling defects in the visualized pulmonary arteries to suggest pulmonary emboli.  Mediastinum/Nodes: No adenopathy in the lower mediastinum or hila.  Lungs/Pleura: Moderate bilateral pleural effusions, new since prior study. Compressive atelectasis in the lower lobes.  Upper Abdomen: Imaging into the upper abdomen shows no acute findings.  Musculoskeletal: Chest wall soft tissues are unremarkable. No acute bony abnormality.  IMPRESSION: New moderate bilateral pleural effusions with compressive atelectasis in the lower lobes  Electronically Signed: By: Franky Crease M.D. On: 09/14/2019 15:38   CARDIAC MRI  MR CARDIAC MORPHOLOGY W WO CONTRAST 04/22/2023  Narrative CLINICAL DATA:  Clinical question of hypertrophic cardiomyopathy Study assumes HCT of 35 and BSA of 1.80 m2  EXAM: CARDIAC MRI  TECHNIQUE: The patient was scanned on a 1.5 Tesla GE magnet. A dedicated cardiac coil was used. Functional imaging was done using Fiesta sequences. 2,3, and 4 chamber views were done to assess for RWMA's. Modified Simpson's rule using a short axis stack was used to calculate an ejection fraction on a dedicated work [employer]. The patient received 7 cc of Gadavist . After 10 minutes inversion recovery sequences were used to assess for infiltration and scar tissue. Flow quantification was performed 2 times during this examination with flow quantification performed at the levels of the ascending aorta above the valve, pulmonary artery above the valve.  CONTRAST:  7 cc  of Gadavist   FINDINGS: 1. Normal left ventricular size, with LVEDD 48 mm, and LVEDVi 60 mL/m2.  Mild septal hypertrophy, with intraventricular septal  thickness of 11 mm, posterior wall thickness of 6 mm, and myocardial mass index of 46 g/m2.  Normal left ventricular systolic function (LVEF =65%). There are no regional wall motion abnormalities. The is LVOT flow acceleration. There is only choral systolic anterior motion of the mitral valve.  Left ventricular parametric mapping notable for normal T2.  There is elevated ECV signal in the basal septal 39%.  There is no late gadolinium enhancement in the left ventricular myocardium. Six standard deviation assessment used.  2. Normal right ventricular size with RVEDVI 68 mL/m2.  Normal right ventricular thickness.  Normal right ventricular systolic function (RVEF =60%). There are no regional wall motion abnormalities or aneurysms.  3. Normal right atrial size. Moderate left atrial dilation. Lipomatous interatrial septal hypertrophy noted. No PFO noted.  4. Normal size of the aortic root, ascending aorta and pulmonary artery.  5. Valve assessment:  Aortic Valve:Tri-leaflet aortic valve. Trivial aortic regurgitation.  Pulmonic Valve: Trivial pulmonic regurgitation.  Tricuspid Valve: Mild tricuspid regurgitation. Regurgitant fraction 8%.  Mitral Valve: Mild tricuspid regurgitation. Regurgitant fraction 8%.  6.  Normal pericardium.  No pericardial effusion.  7. Grossly, no extracardiac findings. Recommended dedicated study if concerned for non-cardiac pathology.  LV EF:  % (Normal 56-78%)  Absolute volumes:  LV EDV: mL (Normal 52-141 mL)  LV ESV: mL (Normal 13-51 mL)  LV SV: mL (Normal 33-97 mL)  CO: L/min (Normal 2.7-6.0 L/min)  Indexed volumes:  LV EDV: mL/sq-m (Normal 41-81 mL/sq-m)  LV ESV: mL/sq-m (Normal 12-21 mL/sq-m)  LV SV: mL/sq-m (Normal 26-56 mL/sq-m)  CI: L/min/sq-m (Normal 1.8-3.8 L/min/sq-m)  LV female  LV EF: % (Normal 56-78%)  Absolute volumes:  LV EDV: mL (Normal 77-195 mL)  LV ESV: mL (Normal 19-72 mL)  LV SV: mL (Normal 51-133  mL)  CO: L/min (  Normal 2.8-8.8 L/min)  Indexed volumes:  LV EDV: mL/sq-m (Normal 47-92 mL/sq-m)  LV ESV: mL/sq-m (Normal 13-30 mL/sq-m)  LV SV: mL/sq-m (Normal 32-62 mL/sq-m)  CI: L/min/sq-m (Normal 1.7-4.2 L/min/sq-m)  Right ventricle:  RV female  RV EF: % (Normal 47-80%)  Absolute volumes:  RV EDV: mL (Normal 58-154 mL)  RV ESV: mL (Normal 12-68 mL)  RV SV: mL (Normal 35-98 mL)  CO: L/min (Normal 2.7-6 L/min)  Indexed volumes:  RV EDV: mL/sq-m (Normal 48-87 mL/sq-m)  RV ESV: mL/sq-m (Normal 11-28 mL/sq-m)  RV SV: mL/sq-m (Normal 27-57 mL/sq-m)  CI: L/min/sq-m (Normal 1.8-3.8 L/min/sq-m)  RV female  RV EF:  % (Normal 47-74%)  Absolute volumes:  RV EDV: mL (Normal 88-227 mL)  RV ESV: mL (Normal 23-103 mL)  RV SV: mL (Normal 52-138 mL)  CO: L/min (Normal 2.8-8.8 L/min)  IMPRESSION: There is not severe hypertrophy in this study. In comparison to 2020 cardiac CT, septal hypertrophy has improved from 18 mm to 11 mm.  No high risk features of hypertrophic cardiomyopathy.  Stanly Leavens MD   Electronically Signed By: Stanly Leavens M.D. On: 04/22/2023 13:39   ______________________________________________________________________________________________       Physical Exam:    VS:  BP 116/69 (BP Location: Right Arm)   Pulse 65   Ht 5' 3.5 (1.613 m)   Wt 149 lb (67.6 kg)   SpO2 98%   BMI 25.98 kg/m     Wt Readings from Last 3 Encounters:  12/27/24 149 lb (67.6 kg)  08/14/23 163 lb (73.9 kg)  01/20/23 159 lb (72.1 kg)    GEN:  Well nourished, well developed in no acute distress HEENT: Normal NECK: No JVD CARDIAC: RRR, soft systolic heart murmur  RESPIRATORY:  Clear to auscultation without rales, wheezing or rhonchi  ABDOMEN: Soft, non-tender, non-distended MUSCULOSKELETAL:  No edema; No deformity  SKIN: Warm and dry NEUROLOGIC:  Alert and oriented x 3 PSYCHIATRIC:  Normal affect    ASSESSMENT/PLAN:     Hypertrophic Cardiomyopathy  - obstructive, sigmoid Variant - peak gradient 9 mm HG on CMI therapy; continue mavacamten  (maintenance phase)  - suspicion of Fabry's/Danon/Noonan's or other mimics of HCM: low - Gene variant: Deferred (Daughter declined testing) - NYHA I - Treadmill testing: 06/23/2023 no high risk features  - Non HCM Contributors to disease/status  Non obstructive CAD- LDL at goal; no symptoms on ASA on statin; working on dietary interventions (T1DM)  Family history Reviewed, Discussed family screening  - Daughter needs echos screening  SCD  Assessment - CMR from 04/22/23 notable for improvement in LV hypertrophy -  2 year assessment for VT on rhythm monitor in 2024 was unremarkable Risk of SCD at 5 years(%) 0.71 Recommendation: Based on the absence of risk factors, this patient does not have an indication for an ICD (Class 3 - No Benefit)  Atrial fibrillation Assessment  - HCM-AF score 18 - Atrial arrhythmia management: Repeat monitor in 2027  Medication symptom plan - Well-managed with Mavacamten . Imaging results are satisfactory, and she reports no significant symptoms. Discussed potential future insurance issues with Mavacamten  and the possibility of switching to Aficamten if necessary. Surgery, such as myectomy, is an option if symptoms worsen, but currently not needed. - Continue Mavacamten  therapy. - Scheduled echocardiogram every six months. - Referred to Dr. Francyne for cardiology follow-up in six months. - Refilled metoprolol  prescription.   Time:   I have spent a total of 41 minutes with the patient reviewing notes, imaging, EKGs, labs, and  examining the patient as well as establishing an assessment and plan that was discussed personally with the patient. Discussed disease state education and insurance concerns.    Stanly Leavens, MD FASE [facility] Cardiologist [facility]  [address] [place], [place] [address] [phone]   8:58 AM  "

## 2024-12-27 NOTE — Patient Instructions (Signed)
 Medication Instructions:  Your physician recommends that you continue on your current medications as directed. Please refer to the Current Medication list given to you today.  *If you need a refill on your cardiac medications before your next appointment, please call your pharmacy*  Lab Work: NONE   Testing/Procedures: NONE   Follow-Up: 1 yr with Dr. Santo  At [facility], you and your health needs are our priority.  As part of our continuing mission to provide you with exceptional heart care, our providers are all part of one team.  This team includes your primary Cardiologist (physician) and Advanced Practice Providers or APPs (Physician Assistants and Nurse Practitioners) who all work together to provide you with the care you need, when you need it.  Your next appointment:   6 month(s)  Provider:   Jerel Balding, MD     Other Instructions

## 2025-01-04 ENCOUNTER — Encounter: Payer: Self-pay | Admitting: Internal Medicine

## 2025-01-06 MED ORDER — METOPROLOL SUCCINATE ER 50 MG PO TB24: 75.0000 mg | ORAL_TABLET | Freq: Every day | ORAL | 3 refills | Status: AC

## 2025-01-20 ENCOUNTER — Encounter: Payer: Self-pay | Admitting: Internal Medicine

## 2025-01-20 ENCOUNTER — Other Ambulatory Visit (HOSPITAL_COMMUNITY): Payer: Self-pay

## 2025-01-20 ENCOUNTER — Telehealth: Payer: Self-pay | Admitting: Pharmacy Technician

## 2025-01-20 NOTE — Telephone Encounter (Signed)
 [phone] ref number   I called [facility] and completed the pa for camzyos  over the phone

## 2025-02-28 ENCOUNTER — Ambulatory Visit (HOSPITAL_COMMUNITY)

## 2025-03-02 ENCOUNTER — Ambulatory Visit (HOSPITAL_COMMUNITY)
